# Patient Record
Sex: Female | Born: 1945 | Race: Black or African American | Hispanic: No | Marital: Married | Smoking: Current every day smoker
Health system: Southern US, Community
[De-identification: ages and names within clinical notes are randomized; demographics above are authoritative.]

## PROBLEM LIST (undated history)

## (undated) DIAGNOSIS — E119 Type 2 diabetes mellitus without complications: Secondary | ICD-10-CM

## (undated) DIAGNOSIS — I129 Hypertensive chronic kidney disease with stage 1 through stage 4 chronic kidney disease, or unspecified chronic kidney disease: Secondary | ICD-10-CM

## (undated) DIAGNOSIS — N183 Chronic kidney disease, stage 3 (moderate): Secondary | ICD-10-CM

## (undated) DIAGNOSIS — E785 Hyperlipidemia, unspecified: Secondary | ICD-10-CM

## (undated) DIAGNOSIS — E1122 Type 2 diabetes mellitus with diabetic chronic kidney disease: Secondary | ICD-10-CM

## (undated) DIAGNOSIS — R202 Paresthesia of skin: Secondary | ICD-10-CM

## (undated) DIAGNOSIS — N189 Chronic kidney disease, unspecified: Secondary | ICD-10-CM

## (undated) DIAGNOSIS — N8500 Endometrial hyperplasia, unspecified: Secondary | ICD-10-CM

## (undated) DIAGNOSIS — C50919 Malignant neoplasm of unspecified site of unspecified female breast: Secondary | ICD-10-CM

## (undated) DIAGNOSIS — N08 Glomerular disorders in diseases classified elsewhere: Secondary | ICD-10-CM

## (undated) DIAGNOSIS — I10 Essential (primary) hypertension: Secondary | ICD-10-CM

## (undated) DIAGNOSIS — R200 Anesthesia of skin: Secondary | ICD-10-CM

## (undated) HISTORY — PX: BACK SURGERY: SHX140

## (undated) HISTORY — DX: Type 2 diabetes mellitus with diabetic chronic kidney disease: E11.22

## (undated) HISTORY — DX: Chronic kidney disease, stage 3 (moderate): N18.3

## (undated) HISTORY — PX: COLONOSCOPY: SHX174

## (undated) HISTORY — PX: BREAST LUMPECTOMY: SHX2

## (undated) HISTORY — DX: Endometrial hyperplasia, unspecified: N85.00

## (undated) HISTORY — DX: Hypertensive chronic kidney disease with stage 1 through stage 4 chronic kidney disease, or unspecified chronic kidney disease: I12.9

## (undated) HISTORY — PX: KNEE SURGERY: SHX244

## (undated) HISTORY — DX: Glomerular disorders in diseases classified elsewhere: N08

---

## 1998-01-22 ENCOUNTER — Encounter: Admission: RE | Admit: 1998-01-22 | Discharge: 1998-04-22 | Payer: Self-pay | Admitting: Radiation Oncology

## 1998-01-28 ENCOUNTER — Encounter: Admission: RE | Admit: 1998-01-28 | Discharge: 1998-04-28 | Payer: Self-pay | Admitting: Radiation Oncology

## 1998-11-06 ENCOUNTER — Encounter: Payer: Self-pay | Admitting: Neurosurgery

## 1998-11-06 ENCOUNTER — Inpatient Hospital Stay (HOSPITAL_COMMUNITY): Admission: RE | Admit: 1998-11-06 | Discharge: 1998-11-07 | Payer: Self-pay | Admitting: Neurosurgery

## 1999-01-01 ENCOUNTER — Other Ambulatory Visit: Admission: RE | Admit: 1999-01-01 | Discharge: 1999-01-01 | Payer: Self-pay | Admitting: Obstetrics & Gynecology

## 1999-08-06 ENCOUNTER — Other Ambulatory Visit: Admission: RE | Admit: 1999-08-06 | Discharge: 1999-08-06 | Payer: Self-pay | Admitting: Gastroenterology

## 1999-08-06 ENCOUNTER — Encounter (INDEPENDENT_AMBULATORY_CARE_PROVIDER_SITE_OTHER): Payer: Self-pay | Admitting: Specialist

## 2000-01-10 ENCOUNTER — Other Ambulatory Visit: Admission: RE | Admit: 2000-01-10 | Discharge: 2000-01-10 | Payer: Self-pay | Admitting: Obstetrics & Gynecology

## 2000-02-01 ENCOUNTER — Encounter: Admission: RE | Admit: 2000-02-01 | Discharge: 2000-02-01 | Payer: Self-pay | Admitting: Hematology and Oncology

## 2000-02-01 ENCOUNTER — Encounter: Payer: Self-pay | Admitting: Hematology and Oncology

## 2000-04-19 ENCOUNTER — Encounter: Admission: RE | Admit: 2000-04-19 | Discharge: 2000-04-19 | Payer: Self-pay | Admitting: Hematology and Oncology

## 2000-04-19 ENCOUNTER — Encounter: Payer: Self-pay | Admitting: Hematology and Oncology

## 2000-05-31 ENCOUNTER — Other Ambulatory Visit: Admission: RE | Admit: 2000-05-31 | Discharge: 2000-05-31 | Payer: Self-pay | Admitting: Obstetrics & Gynecology

## 2000-07-24 ENCOUNTER — Encounter: Admission: RE | Admit: 2000-07-24 | Discharge: 2000-07-24 | Payer: Self-pay | Admitting: Hematology and Oncology

## 2000-07-24 ENCOUNTER — Encounter: Payer: Self-pay | Admitting: Hematology and Oncology

## 2000-12-12 ENCOUNTER — Other Ambulatory Visit: Admission: RE | Admit: 2000-12-12 | Discharge: 2000-12-12 | Payer: Self-pay | Admitting: Obstetrics & Gynecology

## 2001-05-21 ENCOUNTER — Encounter: Payer: Self-pay | Admitting: Hematology and Oncology

## 2001-05-21 ENCOUNTER — Ambulatory Visit (HOSPITAL_COMMUNITY): Admission: RE | Admit: 2001-05-21 | Discharge: 2001-05-21 | Payer: Self-pay | Admitting: Hematology and Oncology

## 2001-07-30 ENCOUNTER — Encounter: Admission: RE | Admit: 2001-07-30 | Discharge: 2001-07-30 | Payer: Self-pay | Admitting: Hematology and Oncology

## 2001-07-30 ENCOUNTER — Encounter: Payer: Self-pay | Admitting: Hematology and Oncology

## 2001-12-19 ENCOUNTER — Other Ambulatory Visit: Admission: RE | Admit: 2001-12-19 | Discharge: 2001-12-19 | Payer: Self-pay | Admitting: Obstetrics & Gynecology

## 2002-06-10 ENCOUNTER — Other Ambulatory Visit: Admission: RE | Admit: 2002-06-10 | Discharge: 2002-06-10 | Payer: Self-pay | Admitting: Obstetrics & Gynecology

## 2002-07-01 ENCOUNTER — Ambulatory Visit (HOSPITAL_COMMUNITY): Admission: RE | Admit: 2002-07-01 | Discharge: 2002-07-01 | Payer: Self-pay | Admitting: Internal Medicine

## 2002-07-01 ENCOUNTER — Encounter: Payer: Self-pay | Admitting: Internal Medicine

## 2002-08-01 ENCOUNTER — Encounter: Payer: Self-pay | Admitting: Hematology and Oncology

## 2002-08-01 ENCOUNTER — Encounter: Admission: RE | Admit: 2002-08-01 | Discharge: 2002-08-01 | Payer: Self-pay | Admitting: Hematology and Oncology

## 2002-12-20 ENCOUNTER — Other Ambulatory Visit: Admission: RE | Admit: 2002-12-20 | Discharge: 2002-12-20 | Payer: Self-pay | Admitting: Obstetrics & Gynecology

## 2003-08-05 ENCOUNTER — Encounter: Admission: RE | Admit: 2003-08-05 | Discharge: 2003-08-05 | Payer: Self-pay | Admitting: Internal Medicine

## 2003-08-05 ENCOUNTER — Encounter: Payer: Self-pay | Admitting: Internal Medicine

## 2003-12-24 ENCOUNTER — Other Ambulatory Visit: Admission: RE | Admit: 2003-12-24 | Discharge: 2003-12-24 | Payer: Self-pay | Admitting: Obstetrics & Gynecology

## 2004-08-06 ENCOUNTER — Encounter: Admission: RE | Admit: 2004-08-06 | Discharge: 2004-08-06 | Payer: Self-pay | Admitting: Internal Medicine

## 2004-12-27 ENCOUNTER — Other Ambulatory Visit: Admission: RE | Admit: 2004-12-27 | Discharge: 2004-12-27 | Payer: Self-pay | Admitting: Obstetrics & Gynecology

## 2005-08-01 ENCOUNTER — Encounter: Admission: RE | Admit: 2005-08-01 | Discharge: 2005-08-01 | Payer: Self-pay | Admitting: Internal Medicine

## 2005-08-09 ENCOUNTER — Encounter: Admission: RE | Admit: 2005-08-09 | Discharge: 2005-08-09 | Payer: Self-pay | Admitting: Internal Medicine

## 2005-12-28 ENCOUNTER — Other Ambulatory Visit: Admission: RE | Admit: 2005-12-28 | Discharge: 2005-12-28 | Payer: Self-pay | Admitting: Obstetrics & Gynecology

## 2006-08-29 ENCOUNTER — Encounter: Admission: RE | Admit: 2006-08-29 | Discharge: 2006-08-29 | Payer: Self-pay | Admitting: Internal Medicine

## 2007-09-03 ENCOUNTER — Encounter: Admission: RE | Admit: 2007-09-03 | Discharge: 2007-09-03 | Payer: Self-pay | Admitting: Internal Medicine

## 2008-02-14 ENCOUNTER — Encounter: Admission: RE | Admit: 2008-02-14 | Discharge: 2008-02-14 | Payer: Self-pay | Admitting: Internal Medicine

## 2008-02-25 ENCOUNTER — Ambulatory Visit (HOSPITAL_BASED_OUTPATIENT_CLINIC_OR_DEPARTMENT_OTHER): Admission: RE | Admit: 2008-02-25 | Discharge: 2008-02-25 | Payer: Self-pay | Admitting: Orthopedic Surgery

## 2008-09-03 ENCOUNTER — Encounter: Admission: RE | Admit: 2008-09-03 | Discharge: 2008-09-03 | Payer: Self-pay | Admitting: Internal Medicine

## 2008-09-25 ENCOUNTER — Encounter: Admission: RE | Admit: 2008-09-25 | Discharge: 2008-09-25 | Payer: Self-pay | Admitting: Internal Medicine

## 2009-09-11 ENCOUNTER — Encounter: Admission: RE | Admit: 2009-09-11 | Discharge: 2009-09-11 | Payer: Self-pay | Admitting: Internal Medicine

## 2009-09-22 ENCOUNTER — Encounter: Admission: RE | Admit: 2009-09-22 | Discharge: 2009-09-22 | Payer: Self-pay | Admitting: Internal Medicine

## 2010-11-14 ENCOUNTER — Encounter: Payer: Self-pay | Admitting: Internal Medicine

## 2011-03-08 NOTE — Op Note (Signed)
[name], Tammy Preston               ACCOUNT NO.:  [id number]   MEDICAL RECORD NO.:  [id number]          PATIENT TYPE:  AMB   LOCATION:  [facility]                         FACILITY:  [facility]   PHYSICIAN:  Marlowe Kays, M.D.  DATE OF BIRTH:  10/15/1946   DATE OF PROCEDURE:  02/25/2008  DATE OF DISCHARGE:                               OPERATIVE REPORT   PREOPERATIVE DIAGNOSES:  1. Torn medial meniscus.  2. Osteoarthritis, right knee.   POSTOPERATIVE DIAGNOSES:  1. Torn medial meniscus.  2. Osteoarthritis, right knee.   OPERATION:  Right knee arthroscopy with partial medial meniscectomy.   SURGEON:  Marlowe Kays, M.D.   ASSISTANT:  Nurse.   ANESTHESIA:  General.   INDICATIONS FOR PROCEDURE:  Because of pain and swelling in her right  knee she had a an MRI performed which showed torn posterior horn tear of  the medial meniscus with some arthritic changes.  She is here today for  treatment consequently, because of significant pain.   PROCEDURE:  Satisfactory general anesthesia, Ace wrap and knee support  to left lower extremity, pneumatic tourniquet right lower extremity  which was Esmarch out non-sterilely.  Thigh stabilizer then applied and  right leg prepped with DuraPrep from stabilizer to ankle and draped in  sterile field.  Time-out performed.  Superior medial saline inflow.  First through an anteromedial portal the lateral compartment knee joint  was evaluated.  She had some minimal fraying of the inner border of the  lateral meniscus which I did not feel warranted shaving, as well as some  roughening of the lateral tibial plateau.  The lateral femoral condyle  in contrast looked quite good.  Looking at the lateral gutter and  suprapatellar area she had some minimal wear of her patella, but again  nothing that required shaving.  I the reversed portals.  She had mild  synovitis medially which I resected for better visualization.  She had  an extensive tear involving the  entire posterior third of the medial  meniscus and in the direct posterior area a flap which was flipped back  on the apparent meniscus.  I resected the meniscus back to stable rim  with a combination of baskets shaving it down until smooth with a 3.5  shaver.  The final remnant was stable on probing.  I then irrigated her  knee joint until clear and all fluid possible was removed.  The two  anterior portals were closed with 4-0 nylon.  I injected 20 mL of 0.5%  Marcaine with adrenaline through the inflow apparatus which was removed  and this portal  closed with 4-0 nylon as well.  Betadine and Adaptic dressing were  applied.  Tourniquet was released.  She tolerated the procedure well.  At the time of the time of this dictation she was on her way to the  recovery room in satisfactory condition with no known complications.           ______________________________  Marlowe Kays, M.D.     JA/MEDQ  D:  02/25/2008  T:  02/25/2008  Job:  [id number]

## 2012-06-06 DIAGNOSIS — H35039 Hypertensive retinopathy, unspecified eye: Secondary | ICD-10-CM | POA: Diagnosis not present

## 2012-06-06 DIAGNOSIS — E119 Type 2 diabetes mellitus without complications: Secondary | ICD-10-CM | POA: Diagnosis not present

## 2012-06-06 DIAGNOSIS — H43819 Vitreous degeneration, unspecified eye: Secondary | ICD-10-CM | POA: Diagnosis not present

## 2012-06-06 DIAGNOSIS — B5801 Toxoplasma chorioretinitis: Secondary | ICD-10-CM | POA: Diagnosis not present

## 2012-07-02 DIAGNOSIS — Z79899 Other long term (current) drug therapy: Secondary | ICD-10-CM | POA: Diagnosis not present

## 2012-07-02 DIAGNOSIS — Z0000 Encounter for general adult medical examination without abnormal findings: Secondary | ICD-10-CM | POA: Diagnosis not present

## 2012-07-02 DIAGNOSIS — E785 Hyperlipidemia, unspecified: Secondary | ICD-10-CM | POA: Diagnosis not present

## 2012-07-02 DIAGNOSIS — E119 Type 2 diabetes mellitus without complications: Secondary | ICD-10-CM | POA: Diagnosis not present

## 2012-07-02 DIAGNOSIS — I10 Essential (primary) hypertension: Secondary | ICD-10-CM | POA: Diagnosis not present

## 2012-12-31 DIAGNOSIS — Z803 Family history of malignant neoplasm of breast: Secondary | ICD-10-CM | POA: Diagnosis not present

## 2012-12-31 DIAGNOSIS — Z1231 Encounter for screening mammogram for malignant neoplasm of breast: Secondary | ICD-10-CM | POA: Diagnosis not present

## 2013-01-03 DIAGNOSIS — N6489 Other specified disorders of breast: Secondary | ICD-10-CM | POA: Diagnosis not present

## 2013-01-07 DIAGNOSIS — Z0000 Encounter for general adult medical examination without abnormal findings: Secondary | ICD-10-CM | POA: Diagnosis not present

## 2013-01-07 DIAGNOSIS — E119 Type 2 diabetes mellitus without complications: Secondary | ICD-10-CM | POA: Diagnosis not present

## 2013-01-07 DIAGNOSIS — E559 Vitamin D deficiency, unspecified: Secondary | ICD-10-CM | POA: Diagnosis not present

## 2013-01-07 DIAGNOSIS — I10 Essential (primary) hypertension: Secondary | ICD-10-CM | POA: Diagnosis not present

## 2013-01-07 DIAGNOSIS — E785 Hyperlipidemia, unspecified: Secondary | ICD-10-CM | POA: Diagnosis not present

## 2013-01-07 DIAGNOSIS — Z79899 Other long term (current) drug therapy: Secondary | ICD-10-CM | POA: Diagnosis not present

## 2013-01-18 ENCOUNTER — Other Ambulatory Visit: Payer: Self-pay | Admitting: Internal Medicine

## 2013-01-18 DIAGNOSIS — R1906 Epigastric swelling, mass or lump: Secondary | ICD-10-CM

## 2013-01-22 ENCOUNTER — Ambulatory Visit
Admission: RE | Admit: 2013-01-22 | Discharge: 2013-01-22 | Disposition: A | Discharge status: Home / Self Care | Payer: Medicare Other | Source: Ambulatory Visit | Attending: Internal Medicine | Admitting: Internal Medicine

## 2013-01-22 DIAGNOSIS — R1906 Epigastric swelling, mass or lump: Secondary | ICD-10-CM | POA: Diagnosis not present

## 2013-01-22 DIAGNOSIS — K5730 Diverticulosis of large intestine without perforation or abscess without bleeding: Secondary | ICD-10-CM | POA: Diagnosis not present

## 2013-01-22 DIAGNOSIS — Z1211 Encounter for screening for malignant neoplasm of colon: Secondary | ICD-10-CM | POA: Diagnosis not present

## 2013-01-22 DIAGNOSIS — Z86010 Personal history of colonic polyps: Secondary | ICD-10-CM | POA: Diagnosis not present

## 2013-01-22 DIAGNOSIS — E669 Obesity, unspecified: Secondary | ICD-10-CM | POA: Diagnosis not present

## 2013-01-22 MED ORDER — IOHEXOL 300 MG/ML  SOLN
125.0000 mL | Freq: Once | INTRAMUSCULAR | Status: AC | PRN
  Administered 2013-01-22: 125 mL via INTRAVENOUS

## 2013-03-25 DIAGNOSIS — D126 Benign neoplasm of colon, unspecified: Secondary | ICD-10-CM | POA: Diagnosis not present

## 2013-03-25 DIAGNOSIS — Z86010 Personal history of colonic polyps: Secondary | ICD-10-CM | POA: Diagnosis not present

## 2013-03-25 DIAGNOSIS — K5730 Diverticulosis of large intestine without perforation or abscess without bleeding: Secondary | ICD-10-CM | POA: Diagnosis not present

## 2013-03-25 DIAGNOSIS — Z1211 Encounter for screening for malignant neoplasm of colon: Secondary | ICD-10-CM | POA: Diagnosis not present

## 2013-06-06 DIAGNOSIS — B5801 Toxoplasma chorioretinitis: Secondary | ICD-10-CM | POA: Diagnosis not present

## 2013-06-06 DIAGNOSIS — H35039 Hypertensive retinopathy, unspecified eye: Secondary | ICD-10-CM | POA: Diagnosis not present

## 2013-06-06 DIAGNOSIS — E119 Type 2 diabetes mellitus without complications: Secondary | ICD-10-CM | POA: Diagnosis not present

## 2013-06-06 DIAGNOSIS — H43819 Vitreous degeneration, unspecified eye: Secondary | ICD-10-CM | POA: Diagnosis not present

## 2013-06-14 DIAGNOSIS — E785 Hyperlipidemia, unspecified: Secondary | ICD-10-CM | POA: Diagnosis not present

## 2013-06-14 DIAGNOSIS — N959 Unspecified menopausal and perimenopausal disorder: Secondary | ICD-10-CM | POA: Diagnosis not present

## 2013-06-14 DIAGNOSIS — E119 Type 2 diabetes mellitus without complications: Secondary | ICD-10-CM | POA: Diagnosis not present

## 2013-06-14 DIAGNOSIS — I10 Essential (primary) hypertension: Secondary | ICD-10-CM | POA: Diagnosis not present

## 2013-06-14 DIAGNOSIS — N951 Menopausal and female climacteric states: Secondary | ICD-10-CM | POA: Diagnosis not present

## 2013-10-03 IMAGING — CT CT ABDOMEN W/ CM
2 of 5 series · 16 of 46 positions shown, 18 images · IV contrast (READICAT/WATER & [ID] OMNI 300)
Comparison: None.

CLINICAL DATA: Epigastric swelling, palpable mass, remote history
breast cancer

CT ABDOMEN WITH CONTRAST
TECHNIQUE: Multidetector CT imaging of the abdomen was performed
following the standard protocol during bolus administration of
intravenous contrast.
Contrast: 125mL OMNIPAQUE IOHEXOL 300 MG/ML  SOLN

[Series 2: abdomen w/ · axial · 0.75mm/px · z∈[-197,+23]mm · 13 of 52 slices shown, 15 images]
[im 4/52  soft-tissue]
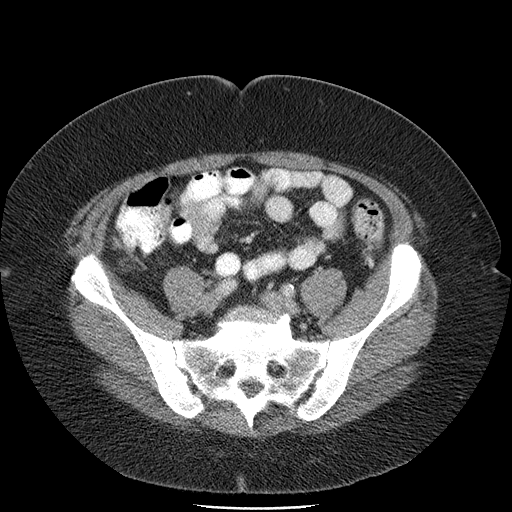
[im 4/52  bone]
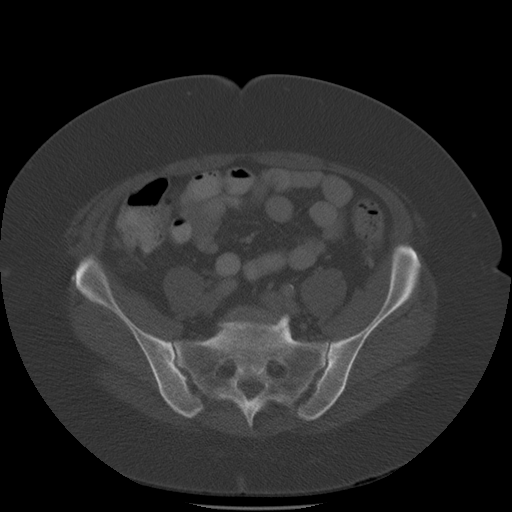
[im 8/52  soft-tissue]
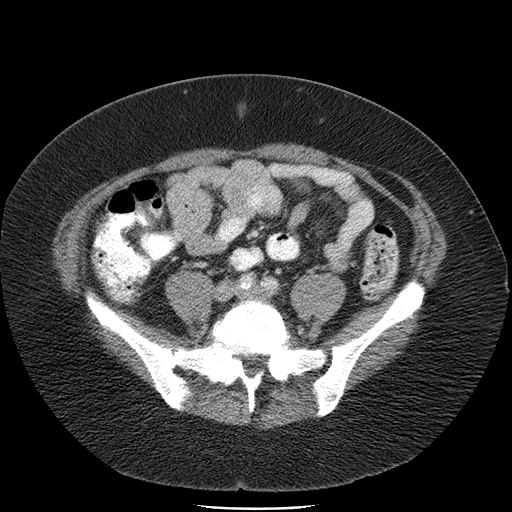
[im 11/52  soft-tissue]
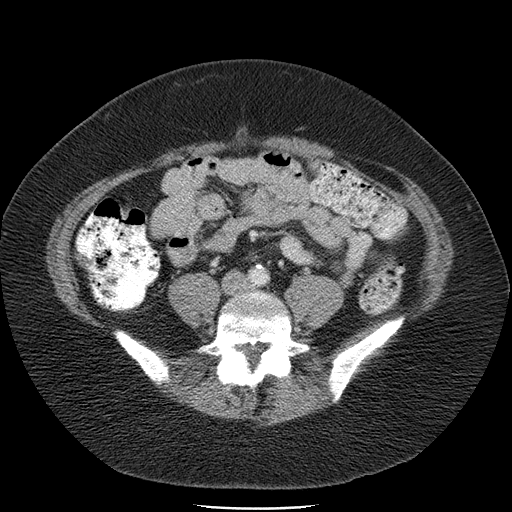
[im 15/52  soft-tissue]
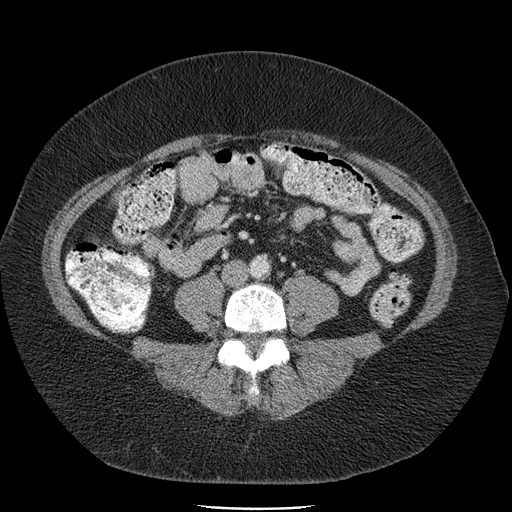
[im 19/52  soft-tissue]
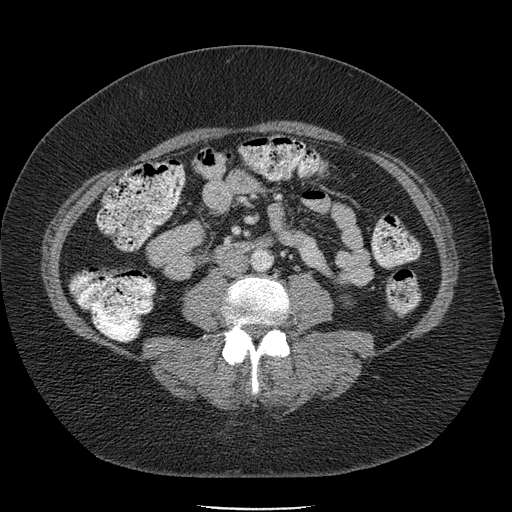
[im 22/52  soft-tissue]
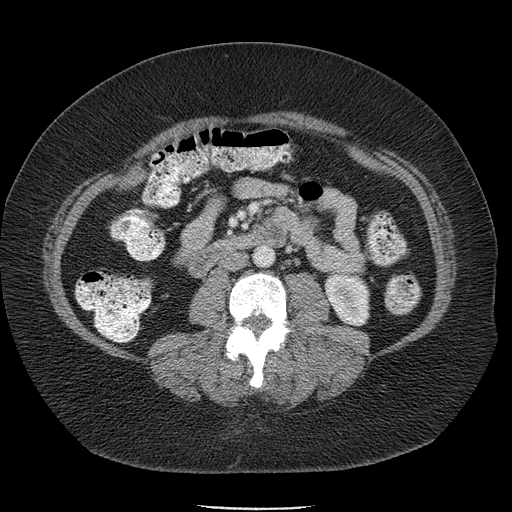
[im 26/52  soft-tissue]
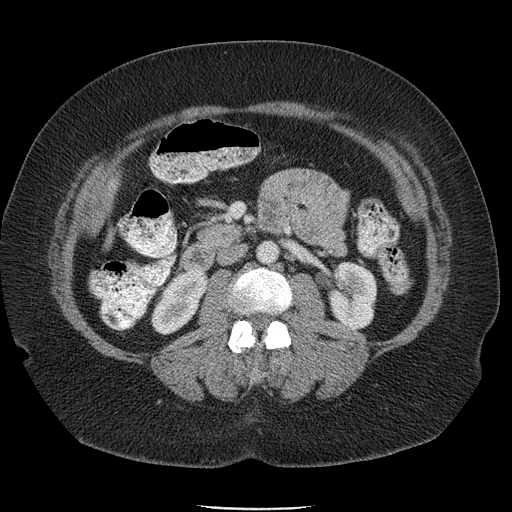
[im 30/52  soft-tissue]
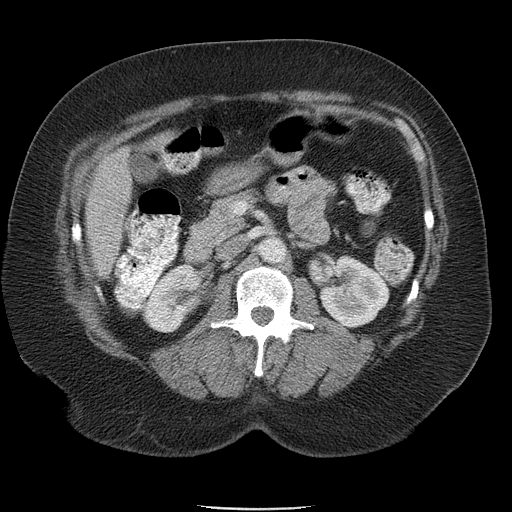
[im 33/52  soft-tissue]
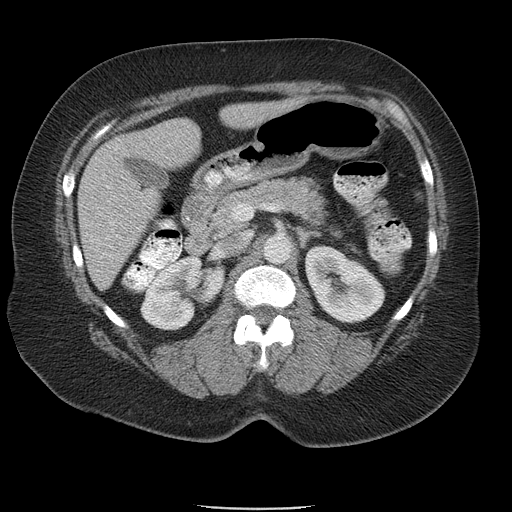
[im 33/52  bone]
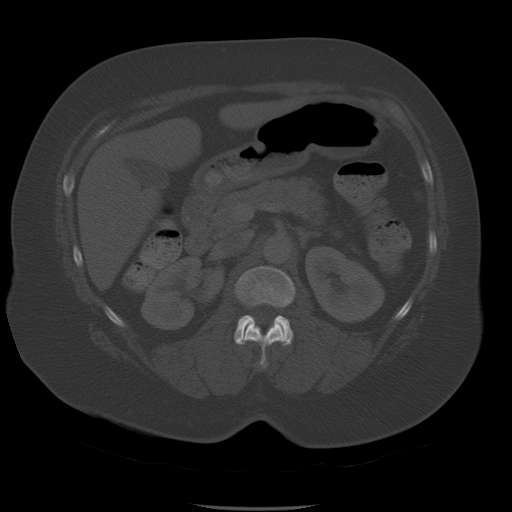
[im 37/52  soft-tissue]
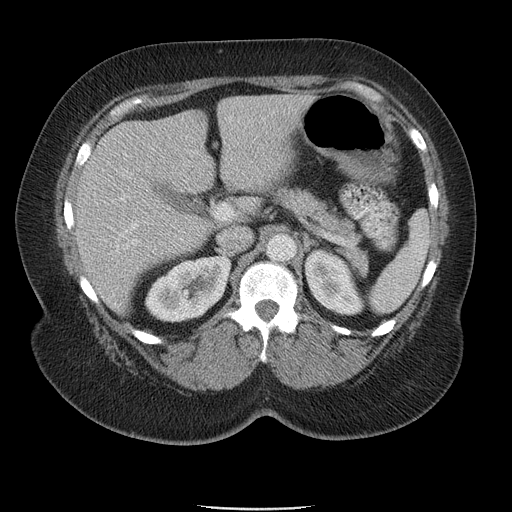
[im 41/52  soft-tissue]
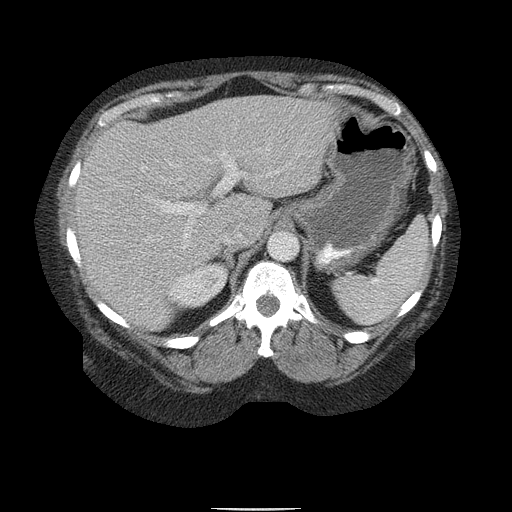
[im 44/52  soft-tissue]
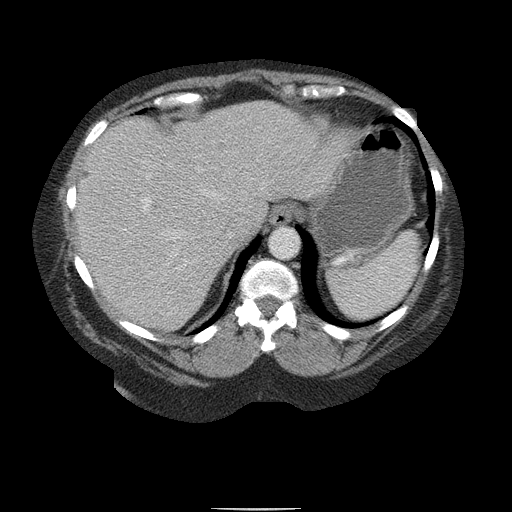
[im 48/52  soft-tissue]
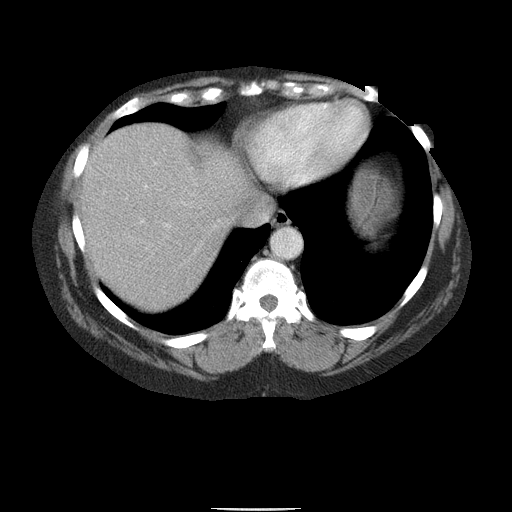

[Series 400: cor · coronal · 0.75mm/px · 3 of 143 slices shown]
[im 48/143  soft-tissue]
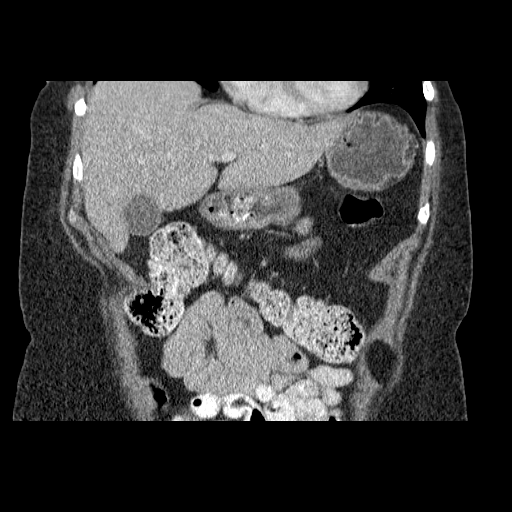
[im 64/143  soft-tissue]
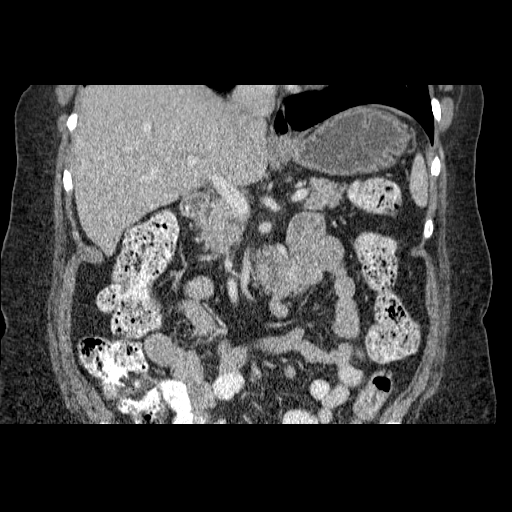
[im 79/143  soft-tissue]
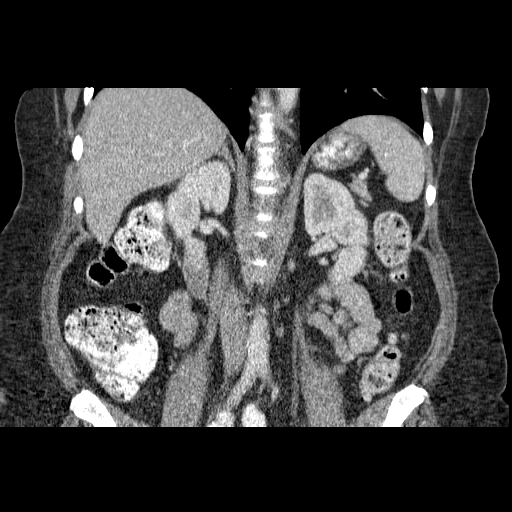

[16 of 46 positions shown; findings below may reference images not displayed]

FINDINGS: Lung bases clear.  Normal heart size.  No pericardial or
pleural effusion.  No hiatal hernia.

Abdomen:  5 mm focal hypodensity in the left hepatic dome, too
small to definitively characterize, image 8.  Suspect small hepatic
cyst.  Scattered punctate hepatic granulomata noted.  No other
focal hepatic abnormality or biliary dilatation.  Hepatic and
portal veins are patent.  Collapsed gallbladder, biliary system,
pancreas, spleen, accessory splenule, adrenal glands, and kidneys
demonstrate no acute process and are within normal limits for age.

No abdominal wall abnormalities noted anteriorly in the epigastric
region.  Negative for abdominal wall hernia.

Atherosclerotic changes noted of the aorta without aneurysm.

Negative for bowel obstruction, dilatation, ileus pattern, or free
air.

No abdominal free fluid, fluid collection, hemorrhage, abscess, or
adenopathy.  Scattered colonic diverticulosis.  Terminal ileum and
appendix visualized are unremarkable.

Degenerative changes noted diffusely of the spine.
IMPRESSION: No acute intra-abdominal process or finding.  Probable small
incidental left hepatic dome sub centimeter cyst.

No epigastric or abdominal wall abnormality.  Negative for hernia.

## 2014-01-01 DIAGNOSIS — Z1231 Encounter for screening mammogram for malignant neoplasm of breast: Secondary | ICD-10-CM | POA: Diagnosis not present

## 2014-01-17 DIAGNOSIS — Z124 Encounter for screening for malignant neoplasm of cervix: Secondary | ICD-10-CM | POA: Diagnosis not present

## 2014-01-17 DIAGNOSIS — E559 Vitamin D deficiency, unspecified: Secondary | ICD-10-CM | POA: Diagnosis not present

## 2014-01-17 DIAGNOSIS — Z0000 Encounter for general adult medical examination without abnormal findings: Secondary | ICD-10-CM | POA: Diagnosis not present

## 2014-01-17 DIAGNOSIS — N183 Chronic kidney disease, stage 3 unspecified: Secondary | ICD-10-CM | POA: Diagnosis not present

## 2014-01-17 DIAGNOSIS — Z01419 Encounter for gynecological examination (general) (routine) without abnormal findings: Secondary | ICD-10-CM | POA: Diagnosis not present

## 2014-01-17 DIAGNOSIS — E1129 Type 2 diabetes mellitus with other diabetic kidney complication: Secondary | ICD-10-CM | POA: Diagnosis not present

## 2014-01-17 DIAGNOSIS — I129 Hypertensive chronic kidney disease with stage 1 through stage 4 chronic kidney disease, or unspecified chronic kidney disease: Secondary | ICD-10-CM | POA: Diagnosis not present

## 2014-01-17 DIAGNOSIS — N058 Unspecified nephritic syndrome with other morphologic changes: Secondary | ICD-10-CM | POA: Diagnosis not present

## 2014-01-17 DIAGNOSIS — Z79899 Other long term (current) drug therapy: Secondary | ICD-10-CM | POA: Diagnosis not present

## 2014-01-17 DIAGNOSIS — E785 Hyperlipidemia, unspecified: Secondary | ICD-10-CM | POA: Diagnosis not present

## 2014-02-03 ENCOUNTER — Encounter (HOSPITAL_COMMUNITY): Payer: Self-pay | Admitting: Emergency Medicine

## 2014-02-03 ENCOUNTER — Emergency Department (HOSPITAL_COMMUNITY)
Admission: EM | Admit: 2014-02-03 | Discharge: 2014-02-03 | Disposition: A | Discharge status: Home / Self Care | Payer: Medicare Other | Attending: Emergency Medicine | Admitting: Emergency Medicine

## 2014-02-03 DIAGNOSIS — E785 Hyperlipidemia, unspecified: Secondary | ICD-10-CM | POA: Diagnosis not present

## 2014-02-03 DIAGNOSIS — Z7982 Long term (current) use of aspirin: Secondary | ICD-10-CM | POA: Insufficient documentation

## 2014-02-03 DIAGNOSIS — I10 Essential (primary) hypertension: Secondary | ICD-10-CM | POA: Insufficient documentation

## 2014-02-03 DIAGNOSIS — Z79899 Other long term (current) drug therapy: Secondary | ICD-10-CM | POA: Insufficient documentation

## 2014-02-03 DIAGNOSIS — F17200 Nicotine dependence, unspecified, uncomplicated: Secondary | ICD-10-CM | POA: Diagnosis not present

## 2014-02-03 DIAGNOSIS — E119 Type 2 diabetes mellitus without complications: Secondary | ICD-10-CM | POA: Diagnosis not present

## 2014-02-03 DIAGNOSIS — Z853 Personal history of malignant neoplasm of breast: Secondary | ICD-10-CM | POA: Insufficient documentation

## 2014-02-03 DIAGNOSIS — M79609 Pain in unspecified limb: Secondary | ICD-10-CM | POA: Diagnosis not present

## 2014-02-03 DIAGNOSIS — M79606 Pain in leg, unspecified: Secondary | ICD-10-CM

## 2014-02-03 HISTORY — DX: Type 2 diabetes mellitus without complications: E11.9

## 2014-02-03 HISTORY — DX: Malignant neoplasm of unspecified site of unspecified female breast: C50.919

## 2014-02-03 HISTORY — DX: Hyperlipidemia, unspecified: E78.5

## 2014-02-03 HISTORY — DX: Essential (primary) hypertension: I10

## 2014-02-03 LAB — D-DIMER, QUANTITATIVE: D-Dimer, Quant: 0.4 ug/mL-FEU (ref 0.00–0.48)

## 2014-02-03 MED ORDER — TRAMADOL HCL 50 MG PO TABS: 50.0000 mg | ORAL_TABLET | Freq: Four times a day (QID) | ORAL | Status: DC | PRN

## 2014-02-03 MED ORDER — TRAMADOL HCL 50 MG PO TABS
50.0000 mg | ORAL_TABLET | Freq: Once | ORAL | Status: AC
  Administered 2014-02-03: 50 mg via ORAL
  Filled 2014-02-03: qty 1

## 2014-02-03 NOTE — Discharge Instructions (Signed)
Recommend you drink plenty of fluids. Followup with your primary care provider for further evaluation of symptoms. You may take Tramadol as needed for severe pain.  Myalgia, Adult Myalgia is the medical term for muscle pain. It is a symptom of many things. Nearly everyone at some time in their life has this. The most common cause for muscle pain is overuse or straining and more so when you are not in shape. Injuries and muscle bruises cause myalgias. Muscle pain without a history of injury can also be caused by a virus. It frequently comes along with the flu. Myalgia not caused by muscle strain can be present in a large number of infectious diseases. Some autoimmune diseases like lupus and fibromyalgia can cause muscle pain. Myalgia may be mild, or severe. SYMPTOMS  The symptoms of myalgia are simply muscle pain. Most of the time this is short lived and the pain goes away without treatment. DIAGNOSIS  Myalgia is diagnosed by your caregiver by taking your history. This means you tell him when the problems began, what they are, and what has been happening. If this has not been a long term problem, your caregiver may want to watch for a while to see what will happen. If it has been long term, they may want to do additional testing. TREATMENT  The treatment depends on what the underlying cause of the muscle pain is. Often anti-inflammatory medications will help. HOME CARE INSTRUCTIONS  If the pain in your muscles came from overuse, slow down your activities until the problems go away.  Myalgia from overuse of a muscle can be treated with alternating hot and cold packs on the muscle affected or with cold for the first couple days. If either heat or cold seems to make things worse, stop their use.  Apply ice to the sore area for 15-20 minutes, 03-04 times per day, while awake for the first 2 days of muscle soreness, or as directed. Put the ice in a plastic bag and place a towel between the bag of ice and  your skin.  Only take over-the-counter or prescription medicines for pain, discomfort, or fever as directed by your caregiver.  Regular gentle exercise may help if you are not active.  Stretching before strenuous exercise can help lower the risk of myalgia. It is normal when beginning an exercise regimen to feel some muscle pain after exercising. Muscles that have not been used frequently will be sore at first. If the pain is extreme, this may mean injury to a muscle. SEEK MEDICAL CARE IF:  You have an increase in muscle pain that is not relieved with medication.  You begin to run a temperature.  You develop nausea and vomiting.  You develop a stiff and painful neck.  You develop a rash.  You develop muscle pain after a tick bite.  You have continued muscle pain while working out even after you are in good condition. SEEK IMMEDIATE MEDICAL CARE IF: Any of your problems are getting worse and medications are not helping. MAKE SURE YOU:   Understand these instructions.  Will watch your condition.  Will get help right away if you are not doing well or get worse. Document Released: 09/01/2006 Document Revised: 01/02/2012 Document Reviewed: 11/21/2006 [facility] Patient Information 2014 [place], [place].

## 2014-02-03 NOTE — ED Provider Notes (Signed)
CSN: [id number]     Arrival date & time 02/03/14  0037 History   First MD Initiated Contact with Patient 02/03/14 0125     Chief Complaint  Patient presents with  . Leg Pain    (Consider location/radiation/quality/duration/timing/severity/associated sxs/prior Treatment) HPI Comments: Patient is a 68 year old female with a history of hypertension, diabetes mellitus, and breast cancer who presents to the emergency department for right lower lateral leg pain. Patient states the pain has been present for the past 2 weeks and constant. She states the pain is throbbing and not relieved by Tylenol. She states that when she elevates her right leg she feels a similar pain in her right hip. She denies any trauma or injury to the area as well as back pain, bowel/bladder incontinence, and genital/perianal numbness. Earlier today, patient states she felt a knot on the side of her leg where her pain was. She is concerned that she may have a blood clot. Patient denies a history of blood clots, coagulopathies, recent surgeries or hospitalizations, fever, syncope, redness or swelling in her right lower extremity, numbness/tingling, and extremity weakness.  Patient is a 68 y.o. female presenting with leg pain. The history is provided by the patient. No language interpreter was used.  Leg Pain   Past Medical History  Diagnosis Date  . Hypertension   . Diabetes mellitus without complication   . Hyperlipemia   . Breast cancer    Past Surgical History  Procedure Laterality Date  . Back surgery    . Knee surgery    . Breast lumpectomy Left    No family history on file. History  Substance Use Topics  . Smoking status: Current Every Day Smoker -- 0.50 packs/day  . Smokeless tobacco: Not on file  . Alcohol Use: No   OB History   Grav Para Term Preterm Abortions TAB SAB Ect Mult Living                  Review of Systems  Musculoskeletal: Positive for myalgias.  All other systems reviewed and are  negative.    Allergies  Review of patient's allergies indicates no known allergies.  Home Medications   Current Outpatient Rx  Name  Route  Sig  Dispense  Refill  . aspirin EC 81 MG tablet   Oral   Take 81 mg by mouth daily.         Marland Kitchen HYDROCHLOROTHIAZIDE PO   Oral   Take by mouth.         Marland Kitchen L-Methylfolate-B6-B12 (FOLTANX PO)   Oral   Take by mouth.         Marland Kitchen LOSARTAN POTASSIUM PO   Oral   Take by mouth.         . METFORMIN HCL PO   Oral   Take by mouth.         . Multiple Vitamins-Minerals (ONE-A-DAY WOMENS 50 PLUS PO)   Oral   Take 1 tablet by mouth daily.         Marland Kitchen SIMVASTATIN PO   Oral   Take by mouth.         . traMADol (ULTRAM) 50 MG tablet   Oral   Take 1 tablet (50 mg total) by mouth every 6 (six) hours as needed.   15 tablet   0    BP 180/95  Pulse 96  Temp(Src) 98.1 F (36.7 C) (Oral)  SpO2 99%  Physical Exam  Nursing note and vitals reviewed. Constitutional: She is  oriented to person, place, and time. She appears well-developed and well-nourished. No distress.  HENT:  Head: Normocephalic and atraumatic.  Eyes: Conjunctivae and EOM are normal. No scleral icterus.  Neck: Normal range of motion.  Cardiovascular: Normal rate, regular rhythm and intact distal pulses.   DP and PT pulses 2+ b/l.  Pulmonary/Chest: Effort normal. No respiratory distress.  Musculoskeletal: Normal range of motion. She exhibits tenderness.       Right lower leg: She exhibits tenderness. She exhibits no bony tenderness, no swelling, no deformity and no laceration.       Legs: Neurological: She is alert and oriented to person, place, and time.  No gross sensory deficits appreciated. Patient ambulates with normal gait. Patellar and achilles reflexes 2+ b/l.  Skin: Skin is warm and dry. No rash noted. She is not diaphoretic. No erythema. No pallor.  No swelling, erythema, or red linear streaking.  Psychiatric: She has a normal mood and affect. Her behavior  is normal.    ED Course  Procedures (including critical care time) Labs Review Labs Reviewed  D-DIMER, QUANTITATIVE   Imaging Review No results found.   EKG Interpretation None      MDM   Final diagnoses:  Leg pain, lateral    Uncomplicated right lower lateral leg pain x2 weeks. Patient well and nontoxic appearing, hemodynamically stable, and afebrile. Tenderness is palpable on examination. No pitting edema swelling, erythema, or red linear streaking appreciated to the right lower extremity. Patient is neurovascularly intact today. No physical exam finding to suggest a blood clot/DVT and D dimer today is negative. No hx of surgeries or hospitalizations. No red flags or signs concerning for cauda equina WRT right hip pain. Suspect source of symptoms to be MSK in nature. Patient stable and appropriate for discharge with instruction to followup with her primary care provider. Return precautions provided and patient agreeable to plan with no unaddressed concerns.    Antonietta Breach, PA-C 02/03/14 [phone]

## 2014-02-03 NOTE — ED Notes (Signed)
Pt states that her R leg has a knot on the side and when she lifts her leg it hurts all the way up to her hip. Wants to make sure it's not a blood clot. No hx of such. Alert, oriented, ambulatory.

## 2014-02-03 NOTE — ED Provider Notes (Signed)
Medical screening examination/treatment/procedure(s) were performed by non-physician practitioner and as supervising physician I was immediately available for consultation/collaboration.   EKG Interpretation None        Hoy Morn, MD 02/03/14 0400

## 2014-04-30 DIAGNOSIS — N183 Chronic kidney disease, stage 3 unspecified: Secondary | ICD-10-CM | POA: Diagnosis not present

## 2014-04-30 DIAGNOSIS — R209 Unspecified disturbances of skin sensation: Secondary | ICD-10-CM | POA: Diagnosis not present

## 2014-04-30 DIAGNOSIS — E1129 Type 2 diabetes mellitus with other diabetic kidney complication: Secondary | ICD-10-CM | POA: Diagnosis not present

## 2014-04-30 DIAGNOSIS — I129 Hypertensive chronic kidney disease with stage 1 through stage 4 chronic kidney disease, or unspecified chronic kidney disease: Secondary | ICD-10-CM | POA: Diagnosis not present

## 2014-05-01 DIAGNOSIS — R209 Unspecified disturbances of skin sensation: Secondary | ICD-10-CM | POA: Diagnosis not present

## 2014-05-15 DIAGNOSIS — X19XXXA Contact with other heat and hot substances, initial encounter: Secondary | ICD-10-CM | POA: Diagnosis not present

## 2014-05-15 DIAGNOSIS — T23039A Burn of unspecified degree of unspecified multiple fingers (nail), not including thumb, initial encounter: Secondary | ICD-10-CM | POA: Diagnosis not present

## 2014-06-06 DIAGNOSIS — H43399 Other vitreous opacities, unspecified eye: Secondary | ICD-10-CM | POA: Diagnosis not present

## 2014-06-06 DIAGNOSIS — H35039 Hypertensive retinopathy, unspecified eye: Secondary | ICD-10-CM | POA: Diagnosis not present

## 2014-06-06 DIAGNOSIS — B589 Toxoplasmosis, unspecified: Secondary | ICD-10-CM | POA: Diagnosis not present

## 2014-06-06 DIAGNOSIS — E119 Type 2 diabetes mellitus without complications: Secondary | ICD-10-CM | POA: Diagnosis not present

## 2014-07-21 DIAGNOSIS — E1129 Type 2 diabetes mellitus with other diabetic kidney complication: Secondary | ICD-10-CM | POA: Diagnosis not present

## 2014-07-21 DIAGNOSIS — N183 Chronic kidney disease, stage 3 unspecified: Secondary | ICD-10-CM | POA: Diagnosis not present

## 2014-07-21 DIAGNOSIS — F17200 Nicotine dependence, unspecified, uncomplicated: Secondary | ICD-10-CM | POA: Diagnosis not present

## 2014-07-21 DIAGNOSIS — N058 Unspecified nephritic syndrome with other morphologic changes: Secondary | ICD-10-CM | POA: Diagnosis not present

## 2014-07-21 DIAGNOSIS — I129 Hypertensive chronic kidney disease with stage 1 through stage 4 chronic kidney disease, or unspecified chronic kidney disease: Secondary | ICD-10-CM | POA: Diagnosis not present

## 2014-07-21 DIAGNOSIS — E559 Vitamin D deficiency, unspecified: Secondary | ICD-10-CM | POA: Diagnosis not present

## 2014-07-21 DIAGNOSIS — Z79899 Other long term (current) drug therapy: Secondary | ICD-10-CM | POA: Diagnosis not present

## 2014-10-01 DIAGNOSIS — M25562 Pain in left knee: Secondary | ICD-10-CM | POA: Diagnosis not present

## 2014-10-01 DIAGNOSIS — Z79899 Other long term (current) drug therapy: Secondary | ICD-10-CM | POA: Diagnosis not present

## 2015-01-06 DIAGNOSIS — Z853 Personal history of malignant neoplasm of breast: Secondary | ICD-10-CM | POA: Diagnosis not present

## 2015-01-06 DIAGNOSIS — Z1231 Encounter for screening mammogram for malignant neoplasm of breast: Secondary | ICD-10-CM | POA: Diagnosis not present

## 2015-01-20 DIAGNOSIS — Z79899 Other long term (current) drug therapy: Secondary | ICD-10-CM | POA: Diagnosis not present

## 2015-01-20 DIAGNOSIS — Z5181 Encounter for therapeutic drug level monitoring: Secondary | ICD-10-CM | POA: Diagnosis not present

## 2015-01-20 DIAGNOSIS — N183 Chronic kidney disease, stage 3 (moderate): Secondary | ICD-10-CM | POA: Diagnosis not present

## 2015-01-20 DIAGNOSIS — Z79891 Long term (current) use of opiate analgesic: Secondary | ICD-10-CM | POA: Diagnosis not present

## 2015-01-20 DIAGNOSIS — E1121 Type 2 diabetes mellitus with diabetic nephropathy: Secondary | ICD-10-CM | POA: Diagnosis not present

## 2015-01-20 DIAGNOSIS — N08 Glomerular disorders in diseases classified elsewhere: Secondary | ICD-10-CM | POA: Diagnosis not present

## 2015-01-20 DIAGNOSIS — F17210 Nicotine dependence, cigarettes, uncomplicated: Secondary | ICD-10-CM | POA: Diagnosis not present

## 2015-01-20 DIAGNOSIS — I129 Hypertensive chronic kidney disease with stage 1 through stage 4 chronic kidney disease, or unspecified chronic kidney disease: Secondary | ICD-10-CM | POA: Diagnosis not present

## 2015-01-20 DIAGNOSIS — E785 Hyperlipidemia, unspecified: Secondary | ICD-10-CM | POA: Diagnosis not present

## 2015-01-20 DIAGNOSIS — Z0000 Encounter for general adult medical examination without abnormal findings: Secondary | ICD-10-CM | POA: Diagnosis not present

## 2015-06-09 DIAGNOSIS — B589 Toxoplasmosis, unspecified: Secondary | ICD-10-CM | POA: Diagnosis not present

## 2015-06-09 DIAGNOSIS — E119 Type 2 diabetes mellitus without complications: Secondary | ICD-10-CM | POA: Diagnosis not present

## 2015-06-09 DIAGNOSIS — H43813 Vitreous degeneration, bilateral: Secondary | ICD-10-CM | POA: Diagnosis not present

## 2015-06-09 DIAGNOSIS — H35039 Hypertensive retinopathy, unspecified eye: Secondary | ICD-10-CM | POA: Diagnosis not present

## 2015-06-22 DIAGNOSIS — E668 Other obesity: Secondary | ICD-10-CM | POA: Diagnosis not present

## 2015-06-22 DIAGNOSIS — N183 Chronic kidney disease, stage 3 (moderate): Secondary | ICD-10-CM | POA: Diagnosis not present

## 2015-06-22 DIAGNOSIS — E1121 Type 2 diabetes mellitus with diabetic nephropathy: Secondary | ICD-10-CM | POA: Diagnosis not present

## 2015-06-22 DIAGNOSIS — I129 Hypertensive chronic kidney disease with stage 1 through stage 4 chronic kidney disease, or unspecified chronic kidney disease: Secondary | ICD-10-CM | POA: Diagnosis not present

## 2015-06-22 DIAGNOSIS — Z79899 Other long term (current) drug therapy: Secondary | ICD-10-CM | POA: Diagnosis not present

## 2015-06-22 DIAGNOSIS — N08 Glomerular disorders in diseases classified elsewhere: Secondary | ICD-10-CM | POA: Diagnosis not present

## 2015-11-11 ENCOUNTER — Ambulatory Visit (INDEPENDENT_AMBULATORY_CARE_PROVIDER_SITE_OTHER): Payer: Medicare Other

## 2015-11-11 ENCOUNTER — Ambulatory Visit (INDEPENDENT_AMBULATORY_CARE_PROVIDER_SITE_OTHER): Payer: Medicare Other | Admitting: Family Medicine

## 2015-11-11 VITALS — BP 128/70 | HR 82 | Temp 98.2°F | Resp 16 | Ht 66.25 in | Wt 218.4 lb

## 2015-11-11 DIAGNOSIS — M25562 Pain in left knee: Secondary | ICD-10-CM | POA: Diagnosis not present

## 2015-11-11 DIAGNOSIS — M1712 Unilateral primary osteoarthritis, left knee: Secondary | ICD-10-CM

## 2015-11-11 MED ORDER — DICLOFENAC SODIUM 1 % TD GEL: 2.0000 g | Freq: Four times a day (QID) | TRANSDERMAL | Status: DC

## 2015-11-11 MED ORDER — TRIAMCINOLONE ACETONIDE 40 MG/ML IJ SUSP
40.0000 mg | Freq: Once | INTRAMUSCULAR | Status: AC
  Administered 2015-11-11: 40 mg via INTRA_ARTICULAR

## 2015-11-11 MED ORDER — ACETAMINOPHEN-CODEINE #3 300-30 MG PO TABS: 1.0000 | ORAL_TABLET | Freq: Four times a day (QID) | ORAL | Status: DC | PRN

## 2015-11-11 NOTE — Progress Notes (Signed)
Subjective:  By signing my name below, I, Moises Blood, attest that this documentation has been prepared under the direction and in the presence of Delman Cheadle, MD. Electronically Signed: Moises Blood, [place]. 11/11/2015 , 10:48 AM .  Patient was seen in Room 10 .   Patient ID: Tammy Preston, female    DOB: 1945/12/08, 70 y.o.   MRN: [id number] Chief Complaint  Patient presents with  . Knee Pain    Left knee, x 3 weeks   HPI Tammy Preston is a 70 y.o. female who presents to [facility] complaining of left knee pain that started 3 weeks ago. She had right knee pain with multiple meniscal tears in 2009 with anserine bursitis effusion and baker's cyst. She had cortisone shot for relief in the past for her right knee pain.   Pt states that the area was very tender to palpation last night. It started in the back of the knee and it radiated to the jointline. She's taken tylenol without improvement. She denies having a knee brace at home. She notes that sometimes her knees would lock up. She denies weakness in her legs.   She mentions that she had similar problems in December and was given Tylenol #3 which "fixed them".   Past Medical History  Diagnosis Date  . Hypertension   . Diabetes mellitus without complication ([place])   . Hyperlipemia   . Breast cancer ([place])    Prior to Admission medications   Medication Sig Start Date End Date Taking? Authorizing Provider  aspirin EC 81 MG tablet Take 81 mg by mouth daily.   Yes Historical Provider, MD  Calcium Carb-Cholecalciferol (CALCIUM + D3 PO) Take by mouth.   Yes Historical Provider, MD  HYDROCHLOROTHIAZIDE PO Take by mouth.   Yes Historical Provider, MD  L-Methylfolate-B6-B12 (FOLTANX PO) Take by mouth. Reported on 11/11/2015    Historical Provider, MD  LOSARTAN POTASSIUM PO Take by mouth.   Yes Historical Provider, MD  METFORMIN HCL PO Take by mouth.   Yes Historical Provider, MD  Multiple Vitamins-Minerals (ONE-A-DAY WOMENS 50 PLUS PO) Take 1  tablet by mouth daily.   Yes Historical Provider, MD  SIMVASTATIN PO Take by mouth.   Yes Historical Provider, MD  traMADol (ULTRAM) 50 MG tablet Take 1 tablet (50 mg total) by mouth every 6 (six) hours as needed. Patient not taking: Reported on 11/11/2015 02/03/14   Antonietta Breach, PA-C   No Known Allergies  Review of Systems  Constitutional: Negative for fever, chills and fatigue.  Gastrointestinal: Negative for nausea, vomiting, diarrhea and constipation.  Musculoskeletal: Positive for arthralgias (left knee). Negative for joint swelling and gait problem.  Skin: Negative for rash and wound.  Neurological: Negative for weakness and numbness.       Objective:   Physical Exam  Constitutional: She is oriented to person, place, and time. She appears well-developed and well-nourished. No distress.  HENT:  Head: Normocephalic and atraumatic.  Eyes: EOM are normal. Pupils are equal, round, and reactive to light.  Neck: Neck supple.  Cardiovascular: Normal rate.   Pulmonary/Chest: Effort normal. No respiratory distress.  Musculoskeletal: Normal range of motion.  Mild crepitous in left knee, no significant effusion compared to right knee, no pain over lateral jointline but pain noticed over medial jointline, patellar stable, minimal pain over pes anserine bursa, negative anterior and posterior drawer, question of some laxity and pain with valgus stress more than varus, negative mcmurray's   Neurological: She is alert and oriented to  person, place, and time.  Skin: Skin is warm and dry.  Psychiatric: She has a normal mood and affect. Her behavior is normal.  Nursing note and vitals reviewed.   BP 128/70 mmHg  Pulse 82  Temp(Src) 98.2 F (36.8 C) (Oral)  Resp 16  Ht 5' 6.25" (1.683 m)  Wt 218 lb 6.4 oz (99.066 kg)  BMI 34.97 kg/m2  SpO2 98%  [facility] reading (PRIMARY) by Dr. Brigitte Pulse : left knee xray: medial arthritis   Risks/benefits of cortisone injection reviewed and verbal informed consent  obtained.  Knee positioned at 10 deg flexion, cleaned with betadine and EtOH.  Anesthesia w/ ethyl chloride cold spray. In superior lateral approach to suprapatellar bursa, injected with 40mg  of DepoMedrol and 4cc of 1% lidocaine using 20g 1 1/2in needle without complications. Pt tolerated procedure well. No EBL.     Assessment & Plan:   1. Left medial knee pain   2. Primary osteoarthritis of left knee   Suspect OA but can't r/o meniscal inj.  S/p initial cortisone inj today.  Responded very well to tylenol #3 prior so refilled small amount to use during flaires.  Try top voltaren.  Orders Placed This Encounter  Procedures  . DG Knee 1-2 Views Left    Standing Status: Future     Number of Occurrences: 1     Standing Expiration Date: 11/10/2016    Order Specific Question:  Reason for Exam (SYMPTOM  OR DIAGNOSIS REQUIRED)    Answer:  medial joint pain, suspect OA and ? meniscal tear    Order Specific Question:  Preferred imaging location?    Answer:  External    Meds ordered this encounter  Medications  . Calcium Carb-Cholecalciferol (CALCIUM + D3 PO)    Sig: Take by mouth.  . triamcinolone acetonide (KENALOG-40) injection 40 mg    Sig:   . acetaminophen-codeine (TYLENOL #3) 300-30 MG tablet    Sig: Take 1 tablet by mouth every 6 (six) hours as needed for moderate pain.    Dispense:  30 tablet    Refill:  0  . diclofenac sodium (VOLTAREN) 1 % GEL    Sig: Apply 2 g topically 4 (four) times daily.    Dispense:  100 g    Refill:  0    I personally performed the services described in this documentation, which was scribed in my presence. The recorded information has been reviewed and considered, and addended by me as needed.  Delman Cheadle, MD MPH

## 2015-11-11 NOTE — Patient Instructions (Addendum)
Rest the knee. Avoid positions and activities that place excessive pressure on the knee joint until pain and swelling resolve. Such activities include: squatting, kneeling, twisting and pivoting, repetitive bending (eg, stairs, getting out of a seated position, clutch and pedal pushing), jogging, dancing, and swimming using the frog or whip kick. ?Apply ice to the knee for 15 minutes every four to six hours, while keeping the leg elevated. ?if the pain is severe you may want to consider using crutches or a cane to help support yourself and take some pressure off of the knee ?A patellar restraining brace may be helpful if quadriceps strength is poor and the knee frequently "gives out."   Begin straight leg raising exercises without weights as the pain begins to wane with the goal of strengthening the quadriceps to provide support to the joint. Begin with sets of 10 leg lifts and gradually work up to 20 to 25 lifts, each held for five seconds. With improvement, light weights can be added to the ankle, beginning with a 2 pound weight and gradually increasing the weight to 5 to 10 pounds. In lieu of exercise weights, a heavy shoe or a bag containing one or more books may be used.  Exercise on equipment that requires deep knee bends against resistance, such as the stair stepper and rowing machine, should be avoided until pain and swelling resolve. Suitable exercises may include walking, swimming using a limited freestyle kick, water aerobics, walking or light jogging on a soft platform treadmill, and using a cross-country ski glide machine.  If you are still having significant pain beyond 4 -6 weeks and did not improve with the steroid injection, than we would want to proceed with an MRI and consider orthopedics or physical therapy.  Knee Injection A knee injection is a procedure to get medicine into your knee joint. Your health care provider puts a needle into the joint and injects medicine with an attached  syringe. The injected medicine may relieve the pain, swelling, and stiffness of arthritis. The injected medicine may also help to lubricate and cushion your knee joint. You may need more than one injection. LET [facility] CARE PROVIDER KNOW ABOUT:  Any allergies you have.  All medicines you are taking, including vitamins, herbs, eye drops, creams, and over-the-counter medicines.  Previous problems you or members of your family have had with the use of anesthetics.  Any blood disorders you have.  Previous surgeries you have had.  Any medical conditions you may have. RISKS AND COMPLICATIONS Generally, this is a safe procedure. However, problems may occur, including:  Infection.  Bleeding.  Worsening symptoms.  Damage to the area around your knee.  Allergic reaction to any of the medicines.  Skin reactions from repeated injections. BEFORE THE PROCEDURE  Ask your health care provider about changing or stopping your regular medicines. This is especially important if you are taking diabetes medicines or blood thinners.  Plan to have someone take you home after the procedure. PROCEDURE  You will sit or lie down in a position for your knee to be treated.  The skin over your kneecap will be cleaned with a germ-killing solution (antiseptic).  You will be given a medicine that numbs the area (local anesthetic). You may feel some stinging.  After your knee becomes numb, you will have a second injection. This is the medicine. This needle is carefully placed between your kneecap and your knee. The medicine is injected into the joint space.  At the end of  the procedure, the needle will be removed.  A bandage (dressing) may be placed over the injection site. The procedure may vary among health care providers and hospitals. AFTER THE PROCEDURE  You may have to move your knee through its full range of motion. This helps to get all of the medicine into your joint space.  Your blood  pressure, heart rate, breathing rate, and blood oxygen level will be monitored often until the medicines you were given have worn off.  You will be watched to make sure that you do not have a reaction to the injected medicine.   This information is not intended to replace advice given to you by your health care provider. Make sure you discuss any questions you have with your health care provider.   Document Released: 01/01/2007 Document Revised: 10/31/2014 Document Reviewed: 08/20/2014 Elsevier Interactive Patient Education 2016 Elsevier Inc.   Osteoarthritis Osteoarthritis is a disease that causes soreness and inflammation of a joint. It occurs when the cartilage at the affected joint wears down. Cartilage acts as a cushion, covering the ends of bones where they meet to form a joint. Osteoarthritis is the most common form of arthritis. It often occurs in older people. The joints affected most often by this condition include those in the:  Ends of the fingers.  Thumbs.  Neck.  Lower back.  Knees.  Hips. CAUSES  Over time, the cartilage that covers the ends of bones begins to wear away. This causes bone to rub on bone, producing pain and stiffness in the affected joints.  RISK FACTORS Certain factors can increase your chances of having osteoarthritis, including:  Older age.  Excessive body weight.  Overuse of joints.  Previous joint injury. SIGNS AND SYMPTOMS   Pain, swelling, and stiffness in the joint.  Over time, the joint may lose its normal shape.  Small deposits of bone (osteophytes) may grow on the edges of the joint.  Bits of bone or cartilage can break off and float inside the joint space. This may cause more pain and damage. DIAGNOSIS  Your health care provider will do a physical exam and ask about your symptoms. Various tests may be ordered, such as:  X-rays of the affected joint.  Blood tests to rule out other types of arthritis. Additional tests may be  used to diagnose your condition. TREATMENT  Goals of treatment are to control pain and improve joint function. Treatment plans may include:  A prescribed exercise program that allows for rest and joint relief.  A weight control plan.  Pain relief techniques, such as:  Properly applied heat and cold.  Electric pulses delivered to nerve endings under the skin (transcutaneous electrical nerve stimulation [TENS]).  Massage.  Certain nutritional supplements.  Medicines to control pain, such as:  Acetaminophen.  Nonsteroidal anti-inflammatory drugs (NSAIDs), such as naproxen.  Narcotic or central-acting agents, such as tramadol.  Corticosteroids. These can be given orally or as an injection.  Surgery to reposition the bones and relieve pain (osteotomy) or to remove loose pieces of bone and cartilage. Joint replacement may be needed in advanced states of osteoarthritis. HOME CARE INSTRUCTIONS   Take medicines only as directed by your health care provider.  Maintain a healthy weight. Follow your health care provider's instructions for weight control. This may include dietary instructions.  Exercise as directed. Your health care provider can recommend specific types of exercise. These may include:  Strengthening exercises. These are done to strengthen the muscles that support joints affected by  arthritis. They can be performed with weights or with exercise bands to add resistance.  Aerobic activities. These are exercises, such as brisk walking or low-impact aerobics, that get your heart pumping.  Range-of-motion activities. These keep your joints limber.  Balance and agility exercises. These help you maintain daily living skills.  Rest your affected joints as directed by your health care provider.  Keep all follow-up visits as directed by your health care provider. SEEK MEDICAL CARE IF:   Your skin turns red.  You develop a rash in addition to your joint pain.  You have  worsening joint pain.  You have a fever along with joint or muscle aches. SEEK IMMEDIATE MEDICAL CARE IF:  You have a significant loss of weight or appetite.  You have night sweats. [place] of Arthritis and Musculoskeletal and Skin Diseases: www.niams.SouthExposed.es  [employer] on Aging: http://kim-miller.com/  American College of Rheumatology: www.rheumatology.org   This information is not intended to replace advice given to you by your health care provider. Make sure you discuss any questions you have with your health care provider.   Document Released: 10/10/2005 Document Revised: 10/31/2014 Document Reviewed: 06/17/2013 Elsevier Interactive Patient Education [employer].

## 2015-11-24 ENCOUNTER — Ambulatory Visit (INDEPENDENT_AMBULATORY_CARE_PROVIDER_SITE_OTHER): Payer: Medicare Other | Admitting: Family Medicine

## 2015-11-24 VITALS — BP 124/80 | HR 104 | Temp 98.1°F | Resp 17 | Ht 66.5 in | Wt 217.0 lb

## 2015-11-24 DIAGNOSIS — M25512 Pain in left shoulder: Secondary | ICD-10-CM | POA: Diagnosis not present

## 2015-11-24 MED ORDER — MELOXICAM 7.5 MG PO TABS: 7.5000 mg | ORAL_TABLET | Freq: Every day | ORAL | Status: DC

## 2015-11-24 NOTE — Progress Notes (Signed)
   Subjective:    Patient ID: Tammy Preston, female    DOB: Oct 31, 1945, 70 y.o.   MRN: [id number]  HPI Patient with left sided shoulder pain for 5 days. She does not recall any injury or overuse. She has not had any falls. The pain was mild at first, then became progressively worse.  Pain is from shoulder to elbow. She had some pain medication at home which she took without relief. No previous injury/pain. No improvement with ice, some improvement with heating pad. She has some improvement in ROM and pain today. No numbness or tingling. She has been limiting movement since pain started.   She goes to [facility] for her regular care and has an appointment for her annual physical in 2 months.   Past Medical History  Diagnosis Date  . Hypertension   . Diabetes mellitus without complication ([place])   . Hyperlipemia   . Breast cancer [facility])    Past Surgical History  Procedure Laterality Date  . Back surgery    . Knee surgery    . Breast lumpectomy Left    Family History  Problem Relation Age of Onset  . Stroke Mother    Social History  Substance Use Topics  . Smoking status: Current Every Day Smoker -- 0.50 packs/day for 48 years  . Smokeless tobacco: None  . Alcohol Use: No     Review of Systems No cough, no chest pain, no SOB    Objective:   Physical Exam  Constitutional: She appears well-developed and well-nourished. No distress.  HENT:  Head: Normocephalic and atraumatic.  Eyes: Conjunctivae are normal.  Neck: Normal range of motion. Neck supple.  Cardiovascular: Normal rate, regular rhythm and normal heart sounds.   Pulmonary/Chest: Effort normal and breath sounds normal.  Musculoskeletal:       Right shoulder: She exhibits decreased range of motion (forward flexion), bony tenderness and pain. She exhibits no swelling.  Skin: She is not diaphoretic.  Vitals reviewed.   BP 124/80 mmHg  Pulse 104  Temp(Src) 98.1 F (36.7 C) (Oral)  Resp 17  Ht 5' 6.5"  (1.689 m)  Wt 217 lb (98.431 kg)  BMI 34.50 kg/m2  SpO2 99%      Assessment & Plan:  1. Left shoulder pain - suspect bursitis, it is improved today, instructed her to use heat several times a day, followed by gentle ROM - meloxicam (MOBIC) 7.5 MG tablet; Take 1 tablet (7.5 mg total) by mouth daily.  Dispense: 30 tablet; Refill: 0-- take for 5-7 days then PRN - RTC precautions- fever/worsening pain or decreased ROM   Clarene Reamer, FNP-BC  [facility]  11/27/2015 7:09 AM

## 2015-11-24 NOTE — Patient Instructions (Signed)
Please take the meloxicam medication once daily for 5-7 days, then as needed. Do not take any other anti-inflammatory drugs (ibuprofen or voltaren gel) while you are using this medication.  Use moist heat 3-4 times a day and follow with gentle range of motion exercises.    Bursitis Bursitis is inflammation and irritation of a bursa, which is one of the small, fluid-filled sacs that cushion and protect the moving parts of your body. These sacs are located between bones and muscles, muscle attachments, or skin areas next to bones. A bursa protects these structures from the wear and tear that results from frequent movement. An inflamed bursa causes pain and swelling. Fluid may build up inside the sac. Bursitis is most common near joints, especially the knees, elbows, hips, and shoulders. CAUSES Bursitis can be caused by:   Injury from:  A direct blow, like falling on your knee or elbow.  Overuse of a joint (repetitive stress).  Infection. This can happen if bacteria gets into a bursa through a cut or scrape near a joint.  Diseases that cause joint inflammation, such as gout and rheumatoid arthritis. RISK FACTORS You may be at risk for bursitis if you:   Have a job or hobby that involves a lot of repetitive stress on your joints.  Have a condition that weakens your body's defense system (immune system), such as diabetes, cancer, or HIV.  Lift and reach overhead often.  Kneel or lean on hard surfaces often.  Run or walk often. SIGNS AND SYMPTOMS The most common signs and symptoms of bursitis are:  Pain that gets worse when you move the affected body part or put weight on it.  Inflammation.  Stiffness. Other signs and symptoms may include:  Redness.  Tenderness.  Warmth.  Pain that continues after rest.  Fever and chills. This may occur in bursitis caused by infection. DIAGNOSIS Bursitis may be diagnosed by:   Medical history and physical exam.  MRI.  A procedure to  drain fluid from the bursa with a needle (aspiration). The fluid may be checked for signs of infection or gout.  Blood tests to rule out other causes of inflammation. TREATMENT  Bursitis can usually be treated at home with rest, ice, compression, and elevation (RICE). For mild bursitis, RICE treatment may be all you need. Other treatments may include:  Nonsteroidal anti-inflammatory drugs (NSAIDs) to treat pain and inflammation.  Corticosteroids to fight inflammation. You may have these drugs injected into and around the area of bursitis.  Aspiration of bursitis fluid to relieve pain and improve movement.  Antibiotic medicine to treat an infected bursa.  A splint, brace, or walking aid.  Physical therapy if you continue to have pain or limited movement.  Surgery to remove a damaged or infected bursa. This may be needed if you have a very bad case of bursitis or if other treatments have not worked. HOME CARE INSTRUCTIONS   Take medicines only as directed by your health care provider.  If you were prescribed an antibiotic medicine, finish it all even if you start to feel better.  Rest the affected area as directed by your health care provider.  Keep the area elevated.  Avoid activities that make pain worse.  Apply ice to the injured area:  Place ice in a plastic bag.  Place a towel between your skin and the bag.  Leave the ice on for 20 minutes, 2-3 times a day.  Use splints, braces, pads, or walking aids as directed by  your health care provider.  Keep all follow-up visits as directed by your health care provider. This is important. PREVENTION   Wear knee pads if you kneel often.  Wear sturdy running or walking shoes that fit you well.  Take regular breaks from repetitive activity.  Warm up by stretching before doing any strenuous activity.  Maintain a healthy weight or lose weight as recommended by your health care provider. Ask your health care provider if you need  help.  Exercise regularly. Start any new physical activity gradually. SEEK MEDICAL CARE IF:   Your bursitis is not responding to treatment or home care.  You have a fever.  You have chills.   This information is not intended to replace advice given to you by your health care provider. Make sure you discuss any questions you have with your health care provider.   Document Released: 10/07/2000 Document Revised: 07/01/2015 Document Reviewed: 12/30/2013 Elsevier Interactive Patient Education [employer].

## 2016-01-18 DIAGNOSIS — Z853 Personal history of malignant neoplasm of breast: Secondary | ICD-10-CM | POA: Diagnosis not present

## 2016-01-18 DIAGNOSIS — Z1231 Encounter for screening mammogram for malignant neoplasm of breast: Secondary | ICD-10-CM | POA: Diagnosis not present

## 2016-01-21 DIAGNOSIS — R928 Other abnormal and inconclusive findings on diagnostic imaging of breast: Secondary | ICD-10-CM | POA: Diagnosis not present

## 2016-01-21 DIAGNOSIS — Z853 Personal history of malignant neoplasm of breast: Secondary | ICD-10-CM | POA: Diagnosis not present

## 2016-01-21 DIAGNOSIS — R922 Inconclusive mammogram: Secondary | ICD-10-CM | POA: Diagnosis not present

## 2016-02-16 DIAGNOSIS — N183 Chronic kidney disease, stage 3 (moderate): Secondary | ICD-10-CM | POA: Diagnosis not present

## 2016-02-16 DIAGNOSIS — E1122 Type 2 diabetes mellitus with diabetic chronic kidney disease: Secondary | ICD-10-CM | POA: Diagnosis not present

## 2016-02-16 DIAGNOSIS — N08 Glomerular disorders in diseases classified elsewhere: Secondary | ICD-10-CM | POA: Diagnosis not present

## 2016-02-16 DIAGNOSIS — I129 Hypertensive chronic kidney disease with stage 1 through stage 4 chronic kidney disease, or unspecified chronic kidney disease: Secondary | ICD-10-CM | POA: Diagnosis not present

## 2016-03-23 DIAGNOSIS — N183 Chronic kidney disease, stage 3 (moderate): Secondary | ICD-10-CM | POA: Diagnosis not present

## 2016-03-23 DIAGNOSIS — N182 Chronic kidney disease, stage 2 (mild): Secondary | ICD-10-CM | POA: Diagnosis not present

## 2016-03-23 DIAGNOSIS — N2581 Secondary hyperparathyroidism of renal origin: Secondary | ICD-10-CM | POA: Diagnosis not present

## 2016-03-23 DIAGNOSIS — E1122 Type 2 diabetes mellitus with diabetic chronic kidney disease: Secondary | ICD-10-CM | POA: Diagnosis not present

## 2016-03-23 DIAGNOSIS — I129 Hypertensive chronic kidney disease with stage 1 through stage 4 chronic kidney disease, or unspecified chronic kidney disease: Secondary | ICD-10-CM | POA: Diagnosis not present

## 2016-03-30 ENCOUNTER — Other Ambulatory Visit: Payer: Self-pay | Admitting: Nephrology

## 2016-03-30 DIAGNOSIS — N183 Chronic kidney disease, stage 3 (moderate): Secondary | ICD-10-CM

## 2016-04-05 ENCOUNTER — Ambulatory Visit
Admission: RE | Admit: 2016-04-05 | Discharge: 2016-04-05 | Disposition: A | Discharge status: Home / Self Care | Payer: Medicare Other | Source: Ambulatory Visit | Attending: Nephrology | Admitting: Nephrology

## 2016-04-05 DIAGNOSIS — N189 Chronic kidney disease, unspecified: Secondary | ICD-10-CM | POA: Diagnosis not present

## 2016-04-05 DIAGNOSIS — N183 Chronic kidney disease, stage 3 (moderate): Secondary | ICD-10-CM

## 2016-06-06 DIAGNOSIS — N08 Glomerular disorders in diseases classified elsewhere: Secondary | ICD-10-CM | POA: Diagnosis not present

## 2016-06-06 DIAGNOSIS — E1122 Type 2 diabetes mellitus with diabetic chronic kidney disease: Secondary | ICD-10-CM | POA: Diagnosis not present

## 2016-06-06 DIAGNOSIS — Z0000 Encounter for general adult medical examination without abnormal findings: Secondary | ICD-10-CM | POA: Diagnosis not present

## 2016-06-06 DIAGNOSIS — I129 Hypertensive chronic kidney disease with stage 1 through stage 4 chronic kidney disease, or unspecified chronic kidney disease: Secondary | ICD-10-CM | POA: Diagnosis not present

## 2016-06-06 DIAGNOSIS — E559 Vitamin D deficiency, unspecified: Secondary | ICD-10-CM | POA: Diagnosis not present

## 2016-06-06 DIAGNOSIS — N183 Chronic kidney disease, stage 3 (moderate): Secondary | ICD-10-CM | POA: Diagnosis not present

## 2016-06-21 DIAGNOSIS — E1122 Type 2 diabetes mellitus with diabetic chronic kidney disease: Secondary | ICD-10-CM | POA: Diagnosis not present

## 2016-06-21 DIAGNOSIS — N183 Chronic kidney disease, stage 3 (moderate): Secondary | ICD-10-CM | POA: Diagnosis not present

## 2016-06-21 DIAGNOSIS — Z6834 Body mass index (BMI) 34.0-34.9, adult: Secondary | ICD-10-CM | POA: Diagnosis not present

## 2016-06-21 DIAGNOSIS — N2581 Secondary hyperparathyroidism of renal origin: Secondary | ICD-10-CM | POA: Diagnosis not present

## 2016-06-21 DIAGNOSIS — I129 Hypertensive chronic kidney disease with stage 1 through stage 4 chronic kidney disease, or unspecified chronic kidney disease: Secondary | ICD-10-CM | POA: Diagnosis not present

## 2016-06-21 DIAGNOSIS — F17200 Nicotine dependence, unspecified, uncomplicated: Secondary | ICD-10-CM | POA: Diagnosis not present

## 2016-07-22 DIAGNOSIS — H35039 Hypertensive retinopathy, unspecified eye: Secondary | ICD-10-CM | POA: Diagnosis not present

## 2016-07-22 DIAGNOSIS — H43813 Vitreous degeneration, bilateral: Secondary | ICD-10-CM | POA: Diagnosis not present

## 2016-07-22 DIAGNOSIS — E119 Type 2 diabetes mellitus without complications: Secondary | ICD-10-CM | POA: Diagnosis not present

## 2016-07-22 DIAGNOSIS — B589 Toxoplasmosis, unspecified: Secondary | ICD-10-CM | POA: Diagnosis not present

## 2016-07-25 DIAGNOSIS — R3121 Asymptomatic microscopic hematuria: Secondary | ICD-10-CM | POA: Diagnosis not present

## 2016-08-02 DIAGNOSIS — R3121 Asymptomatic microscopic hematuria: Secondary | ICD-10-CM | POA: Diagnosis not present

## 2016-08-02 DIAGNOSIS — R3129 Other microscopic hematuria: Secondary | ICD-10-CM | POA: Diagnosis not present

## 2016-09-28 DIAGNOSIS — R3121 Asymptomatic microscopic hematuria: Secondary | ICD-10-CM | POA: Diagnosis not present

## 2016-10-10 DIAGNOSIS — Z1389 Encounter for screening for other disorder: Secondary | ICD-10-CM | POA: Diagnosis not present

## 2016-10-10 DIAGNOSIS — N08 Glomerular disorders in diseases classified elsewhere: Secondary | ICD-10-CM | POA: Diagnosis not present

## 2016-10-10 DIAGNOSIS — N183 Chronic kidney disease, stage 3 (moderate): Secondary | ICD-10-CM | POA: Diagnosis not present

## 2016-10-10 DIAGNOSIS — E1122 Type 2 diabetes mellitus with diabetic chronic kidney disease: Secondary | ICD-10-CM | POA: Diagnosis not present

## 2016-10-10 DIAGNOSIS — R202 Paresthesia of skin: Secondary | ICD-10-CM | POA: Diagnosis not present

## 2016-10-10 DIAGNOSIS — I129 Hypertensive chronic kidney disease with stage 1 through stage 4 chronic kidney disease, or unspecified chronic kidney disease: Secondary | ICD-10-CM | POA: Diagnosis not present

## 2016-10-25 ENCOUNTER — Other Ambulatory Visit: Payer: Self-pay | Admitting: Internal Medicine

## 2016-10-25 DIAGNOSIS — D259 Leiomyoma of uterus, unspecified: Secondary | ICD-10-CM

## 2016-11-04 ENCOUNTER — Ambulatory Visit
Admission: RE | Admit: 2016-11-04 | Discharge: 2016-11-04 | Disposition: A | Discharge status: Home / Self Care | Payer: Medicare Other | Source: Ambulatory Visit | Attending: Internal Medicine | Admitting: Internal Medicine

## 2016-11-04 DIAGNOSIS — D259 Leiomyoma of uterus, unspecified: Secondary | ICD-10-CM | POA: Diagnosis not present

## 2017-01-23 DIAGNOSIS — Z1231 Encounter for screening mammogram for malignant neoplasm of breast: Secondary | ICD-10-CM | POA: Diagnosis not present

## 2017-01-26 DIAGNOSIS — N183 Chronic kidney disease, stage 3 (moderate): Secondary | ICD-10-CM | POA: Diagnosis not present

## 2017-01-26 DIAGNOSIS — N6001 Solitary cyst of right breast: Secondary | ICD-10-CM | POA: Diagnosis not present

## 2017-01-26 DIAGNOSIS — I129 Hypertensive chronic kidney disease with stage 1 through stage 4 chronic kidney disease, or unspecified chronic kidney disease: Secondary | ICD-10-CM | POA: Diagnosis not present

## 2017-01-26 DIAGNOSIS — E1122 Type 2 diabetes mellitus with diabetic chronic kidney disease: Secondary | ICD-10-CM | POA: Diagnosis not present

## 2017-01-26 DIAGNOSIS — Z853 Personal history of malignant neoplasm of breast: Secondary | ICD-10-CM | POA: Diagnosis not present

## 2017-01-26 DIAGNOSIS — N6311 Unspecified lump in the right breast, upper outer quadrant: Secondary | ICD-10-CM | POA: Diagnosis not present

## 2017-01-26 DIAGNOSIS — N08 Glomerular disorders in diseases classified elsewhere: Secondary | ICD-10-CM | POA: Diagnosis not present

## 2017-02-22 DIAGNOSIS — Z124 Encounter for screening for malignant neoplasm of cervix: Secondary | ICD-10-CM | POA: Diagnosis not present

## 2017-02-22 DIAGNOSIS — R1907 Generalized intra-abdominal and pelvic swelling, mass and lump: Secondary | ICD-10-CM | POA: Diagnosis not present

## 2017-02-22 DIAGNOSIS — R938 Abnormal findings on diagnostic imaging of other specified body structures: Secondary | ICD-10-CM | POA: Diagnosis not present

## 2017-02-22 DIAGNOSIS — D259 Leiomyoma of uterus, unspecified: Secondary | ICD-10-CM | POA: Diagnosis not present

## 2017-03-13 ENCOUNTER — Other Ambulatory Visit: Payer: Self-pay | Admitting: Obstetrics and Gynecology

## 2017-03-17 ENCOUNTER — Encounter (HOSPITAL_COMMUNITY): Payer: Self-pay | Admitting: *Deleted

## 2017-03-27 ENCOUNTER — Encounter (HOSPITAL_COMMUNITY): Payer: Self-pay

## 2017-03-27 ENCOUNTER — Encounter (HOSPITAL_COMMUNITY)
Admission: RE | Admit: 2017-03-27 | Discharge: 2017-03-27 | Disposition: A | Discharge status: Home / Self Care | Payer: Medicare Other | Source: Ambulatory Visit | Attending: Obstetrics and Gynecology | Admitting: Obstetrics and Gynecology

## 2017-03-27 DIAGNOSIS — E785 Hyperlipidemia, unspecified: Secondary | ICD-10-CM | POA: Diagnosis not present

## 2017-03-27 DIAGNOSIS — N183 Chronic kidney disease, stage 3 (moderate): Secondary | ICD-10-CM | POA: Insufficient documentation

## 2017-03-27 DIAGNOSIS — I129 Hypertensive chronic kidney disease with stage 1 through stage 4 chronic kidney disease, or unspecified chronic kidney disease: Secondary | ICD-10-CM | POA: Insufficient documentation

## 2017-03-27 DIAGNOSIS — R200 Anesthesia of skin: Secondary | ICD-10-CM | POA: Insufficient documentation

## 2017-03-27 DIAGNOSIS — Z853 Personal history of malignant neoplasm of breast: Secondary | ICD-10-CM | POA: Insufficient documentation

## 2017-03-27 DIAGNOSIS — E1122 Type 2 diabetes mellitus with diabetic chronic kidney disease: Secondary | ICD-10-CM | POA: Diagnosis not present

## 2017-03-27 DIAGNOSIS — Z01810 Encounter for preprocedural cardiovascular examination: Secondary | ICD-10-CM | POA: Diagnosis not present

## 2017-03-27 DIAGNOSIS — Z01812 Encounter for preprocedural laboratory examination: Secondary | ICD-10-CM | POA: Insufficient documentation

## 2017-03-27 HISTORY — DX: Anesthesia of skin: R20.0

## 2017-03-27 HISTORY — DX: Chronic kidney disease, unspecified: N18.9

## 2017-03-27 HISTORY — DX: Paresthesia of skin: R20.2

## 2017-03-27 LAB — CBC
HCT: 41.8 % (ref 36.0–46.0)
HEMOGLOBIN: 14 g/dL (ref 12.0–15.0)
MCH: 29.4 pg (ref 26.0–34.0)
MCHC: 33.5 g/dL (ref 30.0–36.0)
MCV: 87.8 fL (ref 78.0–100.0)
Platelets: 304 10*3/uL (ref 150–400)
RBC: 4.76 MIL/uL (ref 3.87–5.11)
RDW: 14.2 % (ref 11.5–15.5)
WBC: 13.7 10*3/uL — ABNORMAL HIGH (ref 4.0–10.5)

## 2017-03-27 LAB — BASIC METABOLIC PANEL
ANION GAP: 9 (ref 5–15)
BUN: 21 mg/dL — ABNORMAL HIGH (ref 6–20)
CALCIUM: 9.9 mg/dL (ref 8.9–10.3)
CO2: 28 mmol/L (ref 22–32)
Chloride: 102 mmol/L (ref 101–111)
Creatinine, Ser: 1.28 mg/dL — ABNORMAL HIGH (ref 0.44–1.00)
GFR, EST AFRICAN AMERICAN: 48 mL/min — AB (ref >60)
GFR, EST NON AFRICAN AMERICAN: 41 mL/min — AB (ref >60)
Glucose, Bld: 126 mg/dL — ABNORMAL HIGH (ref 65–99)
Potassium: 3.5 mmol/L (ref 3.5–5.1)
Sodium: 139 mmol/L (ref 135–145)

## 2017-03-27 NOTE — Patient Instructions (Signed)
Your procedure is scheduled on:  Friday, April 07, 2017  Enter through the [employer] [facility] at:  12:45 PM  Pick up the phone at the desk and dial [phone].  Call this number if you have problems the morning of surgery: [phone].  Remember: Do NOT eat food:  After 6:00 AM day of surgery  Do NOT drink clear liquids after:  10:00 AM day of surgery  Take these medicines the morning of surgery with a SIP OF WATER:  Hydrochlorothiazide, Losartan, Simvastatin  Stop ALL herbal medications and Aspirin at this time  Do NOT smoke the day of surgery.  Do NOT wear jewelry (body piercing), metal hair clips/bobby pins, make-up, or nail polish. Do NOT wear lotions, powders, or perfumes.  You may wear deodorant. Do NOT shave for 48 hours prior to surgery. Do NOT bring valuables to the hospital. Contacts, dentures, or bridgework may not be worn into surgery.  Have a responsible adult drive you home and stay with you for 24 hours after your procedure  Bring a copy of your healthcare power of attorney and living will documents.

## 2017-03-27 NOTE — Pre-Procedure Instructions (Signed)
Reviewed medical history with Dr. Smith Robert, anesthesia would prefer her surgery be scheduled earlier in the day if possible.  I spoke with [place] at Dr. Harvie Bridge office and made her aware.

## 2017-04-07 ENCOUNTER — Encounter (HOSPITAL_COMMUNITY): Payer: Self-pay

## 2017-04-07 ENCOUNTER — Ambulatory Visit (HOSPITAL_COMMUNITY)
Admission: RE | Admit: 2017-04-07 | Discharge: 2017-04-07 | Disposition: A | Discharge status: Home / Self Care | Payer: Medicare Other | Source: Ambulatory Visit | Attending: Obstetrics and Gynecology | Admitting: Obstetrics and Gynecology

## 2017-04-07 ENCOUNTER — Ambulatory Visit (HOSPITAL_COMMUNITY): Payer: Medicare Other | Admitting: Anesthesiology

## 2017-04-07 ENCOUNTER — Encounter (HOSPITAL_COMMUNITY)
Admission: RE | Disposition: A | Discharge status: Home / Self Care | Payer: Self-pay | Source: Ambulatory Visit | Attending: Obstetrics and Gynecology

## 2017-04-07 DIAGNOSIS — Z853 Personal history of malignant neoplasm of breast: Secondary | ICD-10-CM | POA: Insufficient documentation

## 2017-04-07 DIAGNOSIS — N183 Chronic kidney disease, stage 3 (moderate): Secondary | ICD-10-CM | POA: Insufficient documentation

## 2017-04-07 DIAGNOSIS — I129 Hypertensive chronic kidney disease with stage 1 through stage 4 chronic kidney disease, or unspecified chronic kidney disease: Secondary | ICD-10-CM | POA: Insufficient documentation

## 2017-04-07 DIAGNOSIS — N856 Intrauterine synechiae: Secondary | ICD-10-CM | POA: Diagnosis not present

## 2017-04-07 DIAGNOSIS — F17210 Nicotine dependence, cigarettes, uncomplicated: Secondary | ICD-10-CM | POA: Diagnosis not present

## 2017-04-07 DIAGNOSIS — N840 Polyp of corpus uteri: Secondary | ICD-10-CM | POA: Diagnosis not present

## 2017-04-07 DIAGNOSIS — Z885 Allergy status to narcotic agent status: Secondary | ICD-10-CM | POA: Insufficient documentation

## 2017-04-07 DIAGNOSIS — E785 Hyperlipidemia, unspecified: Secondary | ICD-10-CM | POA: Insufficient documentation

## 2017-04-07 DIAGNOSIS — Z79899 Other long term (current) drug therapy: Secondary | ICD-10-CM | POA: Diagnosis not present

## 2017-04-07 DIAGNOSIS — N858 Other specified noninflammatory disorders of uterus: Secondary | ICD-10-CM | POA: Diagnosis not present

## 2017-04-07 DIAGNOSIS — Z7984 Long term (current) use of oral hypoglycemic drugs: Secondary | ICD-10-CM | POA: Insufficient documentation

## 2017-04-07 DIAGNOSIS — N950 Postmenopausal bleeding: Secondary | ICD-10-CM | POA: Diagnosis not present

## 2017-04-07 DIAGNOSIS — Q512 Other doubling of uterus: Secondary | ICD-10-CM | POA: Diagnosis not present

## 2017-04-07 DIAGNOSIS — E1122 Type 2 diabetes mellitus with diabetic chronic kidney disease: Secondary | ICD-10-CM | POA: Diagnosis not present

## 2017-04-07 DIAGNOSIS — R938 Abnormal findings on diagnostic imaging of other specified body structures: Secondary | ICD-10-CM | POA: Diagnosis not present

## 2017-04-07 HISTORY — PX: DILATATION & CURETTAGE/HYSTEROSCOPY WITH MYOSURE: SHX6511

## 2017-04-07 LAB — GLUCOSE, CAPILLARY: Glucose-Capillary: 116 mg/dL — ABNORMAL HIGH (ref 65–99)

## 2017-04-07 SURGERY — DILATATION & CURETTAGE/HYSTEROSCOPY WITH MYOSURE
Anesthesia: General | Site: Vagina

## 2017-04-07 MED ORDER — EPHEDRINE SULFATE 50 MG/ML IJ SOLN
INTRAMUSCULAR | Status: DC | PRN
  Administered 2017-04-07: 10 mg via INTRAVENOUS

## 2017-04-07 MED ORDER — SODIUM CHLORIDE 0.9 % IR SOLN
Status: DC | PRN
  Administered 2017-04-07: 3000 mL

## 2017-04-07 MED ORDER — METOCLOPRAMIDE HCL 5 MG/ML IJ SOLN: 10.0000 mg | Freq: Once | INTRAMUSCULAR | Status: DC | PRN

## 2017-04-07 MED ORDER — ONDANSETRON HCL 4 MG/2ML IJ SOLN
INTRAMUSCULAR | Status: DC | PRN
  Administered 2017-04-07: 4 mg via INTRAVENOUS

## 2017-04-07 MED ORDER — MEPERIDINE HCL 25 MG/ML IJ SOLN: 6.2500 mg | INTRAMUSCULAR | Status: DC | PRN

## 2017-04-07 MED ORDER — SCOPOLAMINE 1 MG/3DAYS TD PT72: 1.0000 | MEDICATED_PATCH | Freq: Once | TRANSDERMAL | Status: DC

## 2017-04-07 MED ORDER — FENTANYL CITRATE (PF) 100 MCG/2ML IJ SOLN
INTRAMUSCULAR | Status: AC
  Filled 2017-04-07: qty 2

## 2017-04-07 MED ORDER — DEXAMETHASONE SODIUM PHOSPHATE 10 MG/ML IJ SOLN
INTRAMUSCULAR | Status: DC | PRN
  Administered 2017-04-07: 4 mg via INTRAVENOUS

## 2017-04-07 MED ORDER — LIDOCAINE HCL (CARDIAC) 20 MG/ML IV SOLN
INTRAVENOUS | Status: DC | PRN
  Administered 2017-04-07: 80 mg via INTRAVENOUS

## 2017-04-07 MED ORDER — PROPOFOL 10 MG/ML IV BOLUS
INTRAVENOUS | Status: AC
  Filled 2017-04-07: qty 20

## 2017-04-07 MED ORDER — FENTANYL CITRATE (PF) 100 MCG/2ML IJ SOLN: 25.0000 ug | INTRAMUSCULAR | Status: DC | PRN

## 2017-04-07 MED ORDER — DEXAMETHASONE SODIUM PHOSPHATE 4 MG/ML IJ SOLN
INTRAMUSCULAR | Status: AC
  Filled 2017-04-07: qty 1

## 2017-04-07 MED ORDER — LACTATED RINGERS IV SOLN
INTRAVENOUS | Status: DC
  Administered 2017-04-07: 12:00:00 via INTRAVENOUS

## 2017-04-07 MED ORDER — EPHEDRINE 5 MG/ML INJ
INTRAVENOUS | Status: AC
  Filled 2017-04-07: qty 10

## 2017-04-07 MED ORDER — LIDOCAINE HCL (CARDIAC) 20 MG/ML IV SOLN
INTRAVENOUS | Status: AC
  Filled 2017-04-07: qty 5

## 2017-04-07 MED ORDER — PROPOFOL 10 MG/ML IV BOLUS
INTRAVENOUS | Status: DC | PRN
  Administered 2017-04-07: 160 mg via INTRAVENOUS

## 2017-04-07 MED ORDER — LACTATED RINGERS IV SOLN: INTRAVENOUS | Status: DC

## 2017-04-07 MED ORDER — FENTANYL CITRATE (PF) 100 MCG/2ML IJ SOLN
INTRAMUSCULAR | Status: DC | PRN
  Administered 2017-04-07 (×2): 25 ug via INTRAVENOUS

## 2017-04-07 MED ORDER — ONDANSETRON HCL 4 MG/2ML IJ SOLN
INTRAMUSCULAR | Status: AC
  Filled 2017-04-07: qty 2

## 2017-04-07 SURGICAL SUPPLY — 18 items
CANISTER SUCT 3000ML PPV (MISCELLANEOUS) ×4 IMPLANT
CATH ROBINSON RED A/P 16FR (CATHETERS) ×2 IMPLANT
CLOTH BEACON ORANGE TIMEOUT ST (SAFETY) ×2 IMPLANT
CONTAINER PREFILL 10% NBF 60ML (FORM) ×4 IMPLANT
DEVICE MYOSURE LITE (MISCELLANEOUS) ×2 IMPLANT
DEVICE MYOSURE REACH (MISCELLANEOUS) IMPLANT
DILATOR CANAL MILEX (MISCELLANEOUS) ×2 IMPLANT
FILTER ARTHROSCOPY CONVERTOR (FILTER) ×2 IMPLANT
GLOVE BIOGEL PI IND STRL 7.0 (GLOVE) ×2 IMPLANT
GLOVE BIOGEL PI INDICATOR 7.0 (GLOVE) ×2
GLOVE ECLIPSE 6.5 STRL STRAW (GLOVE) ×2 IMPLANT
GOWN STRL REUS W/TWL LRG LVL3 (GOWN DISPOSABLE) ×4 IMPLANT
PACK VAGINAL MINOR WOMEN LF (CUSTOM PROCEDURE TRAY) ×2 IMPLANT
PAD OB MATERNITY 4.3X12.25 (PERSONAL CARE ITEMS) ×2 IMPLANT
SEAL ROD LENS SCOPE MYOSURE (ABLATOR) ×2 IMPLANT
TOWEL OR 17X24 6PK STRL BLUE (TOWEL DISPOSABLE) ×4 IMPLANT
TUBING AQUILEX INFLOW (TUBING) ×2 IMPLANT
TUBING AQUILEX OUTFLOW (TUBING) ×2 IMPLANT

## 2017-04-07 NOTE — H&P (Signed)
Tammy Preston is an 71 y.o. female presents for dx hysteroscopy, D&C, resection of endometrial mass due to endometrial thickening noted on sonogram  Pertinent Gynecological History: Menses: post-menopausal Bleeding: n/a Contraception: none DES exposure: denies Blood transfusions: none Sexually transmitted diseases: no past history Previous GYN Procedures: DNC  Last mammogram: normal Date: 01/2017 Last pap: normal Date:02/2017 OB History: G2P1011   Menstrual History: Menarche age: n/a No LMP recorded. Patient is postmenopausal.    Past Medical History:  Diagnosis Date  . Breast cancer ([place])   . Chronic kidney disease    CKD stage 3  . Diabetes mellitus without complication ([place])   . Hyperlipemia   . Hypertension   . Numbness and tingling of both feet     Past Surgical History:  Procedure Laterality Date  . BACK SURGERY    . BREAST LUMPECTOMY Left   . COLONOSCOPY    . KNEE SURGERY      Family History  Problem Relation Age of Onset  . Stroke Mother     Social History:  reports that she has been smoking Cigarettes.  She has a 12.00 pack-year smoking history. She has never used smokeless tobacco. She reports that she does not drink alcohol or use drugs.  Allergies:  Allergies  Allergen Reactions  . Percocet [Oxycodone-Acetaminophen] Nausea And Vomiting    No prescriptions prior to admission.    Review of Systems  All other systems reviewed and are negative.   There were no vitals taken for this visit. Physical Exam  Constitutional: She is oriented to person, place, and time. She appears well-developed and well-nourished.  HENT:  Head: Atraumatic.  Neck: Neck supple.  Cardiovascular: Regular rhythm.   Respiratory: Breath sounds normal.  GI: Bowel sounds are normal.  Genitourinary: Vagina normal and uterus normal.  Neurological: She is alert and oriented to person, place, and time.  Skin: Skin is warm and dry.  Psychiatric: She has a normal mood and  affect.    No results found for this or any previous visit (from the past 24 hour(s)).  No results found.  Assessment/Plan: Endometrial thickening Endometrial mass P) dx hysteroscopy, D&C, resection of endometrial mass Risk of surgery includes infection, bleeding, uterine perforation and its risk, thermal injury, fluid overload and its mgmt. All ? answered  Cherrill Scrima A 04/07/2017 12:35

## 2017-04-07 NOTE — Transfer of Care (Signed)
Immediate Anesthesia Transfer of Care Note  Patient: Tammy Preston  Procedure(s) Performed: Procedure(s): DILATATION & CURETTAGE/HYSTEROSCOPY WITH MYOSURE (N/A)  Patient Location: PACU  Anesthesia Type:General  Level of Consciousness: awake, alert  and oriented  Airway & Oxygen Therapy: Patient Spontanous Breathing and Patient connected to nasal cannula oxygen  Post-op Assessment: Report given to RN, Post -op Vital signs reviewed and stable and Patient moving all extremities  Post vital signs: Reviewed and stable  Last Vitals:  Vitals:   04/07/17 1210  BP: 137/77  Pulse: 84  Resp: 20  Temp: 36.7 C    Last Pain:  Vitals:   04/07/17 1210  TempSrc: Oral      Patients Stated Pain Goal: 3 (03/03/01 1117)  Complications: No apparent anesthesia complications

## 2017-04-07 NOTE — Anesthesia Postprocedure Evaluation (Signed)
Anesthesia Post Note  Patient: Tammy Preston  Procedure(s) Performed: Procedure(s) (LRB): DILATATION & CURETTAGE/HYSTEROSCOPY WITH MYOSURE (N/A)     Patient location during evaluation: PACU Anesthesia Type: General Level of consciousness: awake and alert Pain management: pain level controlled Vital Signs Assessment: post-procedure vital signs reviewed and stable Respiratory status: spontaneous breathing, nonlabored ventilation, respiratory function stable and patient connected to nasal cannula oxygen Cardiovascular status: blood pressure returned to baseline and stable Postop Assessment: no signs of nausea or vomiting Anesthetic complications: no    Last Vitals:  Vitals:   04/07/17 1530 04/07/17 1608  BP: 138/77 130/80  Pulse: 65 69  Resp: 16 16  Temp: 36.3 C 36.4 C    Last Pain:  Vitals:   04/07/17 1608  TempSrc:   PainSc: 0-No pain   Pain Goal: Patients Stated Pain Goal: 3 (04/07/17 1530)               Montez Hageman

## 2017-04-07 NOTE — Anesthesia Procedure Notes (Signed)
Procedure Name: LMA Insertion Date/Time: 04/07/2017 1:37 PM Performed by: Hewitt Blade Pre-anesthesia Checklist: Patient identified, Emergency Drugs available, Suction available and Patient being monitored Patient Re-evaluated:Patient Re-evaluated prior to inductionOxygen Delivery Method: Circle system utilized Preoxygenation: Pre-oxygenation with 100% oxygen Intubation Type: IV induction LMA: LMA inserted LMA Size: 4.0 Number of attempts: 1 Placement Confirmation: positive ETCO2 and breath sounds checked- equal and bilateral Tube secured with: Tape Dental Injury: Teeth and Oropharynx as per pre-operative assessment

## 2017-04-07 NOTE — Brief Op Note (Signed)
04/07/2017  2:29 PM  PATIENT:  Tammy Preston  71 y.o. 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  PROCEDURE: diagnostic hysteroscopy, D&C  SURGEON:  Surgeon(s) and Role:    * Servando Salina, MD - Primary  PHYSICIAN ASSISTANT:   ASSISTANTS: none   ANESTHESIA:   general Findings: endometrial cavity with septum, scar tissue EBL:  Total I/O In: 300 [I.V.:300] Out: 125 [Urine:100; Blood:25]  BLOOD ADMINISTERED:none  DRAINS: none   LOCAL MEDICATIONS USED:  NONE  SPECIMEN:  Source of Specimen:  emc  DISPOSITION OF SPECIMEN:  PATHOLOGY  COUNTS:  YES  TOURNIQUET:  * No tourniquets in log *  DICTATION: .Other Dictation: Dictation Number [id number]  PLAN OF CARE: Discharge to home after PACU  PATIENT DISPOSITION:  PACU - hemodynamically stable.   Delay start of Pharmacological VTE agent (>24hrs) due to surgical blood loss or risk of bleeding: not applicable

## 2017-04-07 NOTE — Discharge Instructions (Addendum)
DISCHARGE INSTRUCTIONS: HYSTEROSCOPY / ENDOMETRIAL ABLATION The following instructions have been prepared to help you care for yourself upon your return home.  May take stool softner while taking narcotic pain medication to prevent constipation.  Drink plenty of water.  Personal hygiene:  Use sanitary pads for vaginal drainage, not tampons.  Shower the day after your procedure.  NO tub baths, pools or Jacuzzis for 2-3 weeks.  Wipe front to back after using the bathroom.  Activity and limitations:  Do NOT drive or operate any equipment for 24 hours. The effects of anesthesia are still present and drowsiness may result.  Do NOT rest in bed all day.  Walking is encouraged.  Walk up and down stairs slowly.  You may resume your normal activity in one to two days or as indicated by your physician. Sexual activity: NO intercourse for at least 2 weeks after the procedure, or as indicated by your Doctor.  Diet: Eat a light meal as desired this evening. You may resume your usual diet tomorrow.  Return to Work: You may resume your work activities in one to two days or as indicated by Marine scientist.  What to expect after your surgery: Expect to have vaginal bleeding/discharge for 2-3 days and spotting for up to 10 days. It is not unusual to have soreness for up to 1-2 weeks. You may have a slight burning sensation when you urinate for the first day. Mild cramps may continue for a couple of days. You may have a regular period in 2-6 weeks.  Call your doctor for any of the following:  Excessive vaginal bleeding or clotting, saturating and changing one pad every hour.  Inability to urinate 6 hours after discharge from hospital.  Pain not relieved by pain medication.  Fever of 100.4 F or greater.  Unusual vaginal discharge or odor.  Return to office _________________Call for an appointment ___________________ Patients signature: ______________________ Nurses signature  ________________________  Post Anesthesia Care Unit [phone]   Post Anesthesia Home Care Instructions  Activity: Get plenty of rest for the remainder of the day. A responsible individual must stay with you for 24 hours following the procedure.  For the next 24 hours, DO NOT: -Drive a car -Paediatric nurse -Drink alcoholic beverages -Take any medication unless instructed by your physician -Make any legal decisions or sign important papers.  Meals: Start with liquid foods such as gelatin or soup. Progress to regular foods as tolerated. Avoid greasy, spicy, heavy foods. If nausea and/or vomiting occur, drink only clear liquids until the nausea and/or vomiting subsides. Call your physician if vomiting continues.  Special Instructions/Symptoms: Your throat may feel dry or sore from the anesthesia or the breathing tube placed in your throat during surgery. If this causes discomfort, gargle with warm salt water. The discomfort should disappear within 24 hours.  I

## 2017-04-07 NOTE — Anesthesia Preprocedure Evaluation (Signed)
Anesthesia Evaluation  Patient identified by MRN, date of birth, ID band Patient awake    Reviewed: Allergy & Precautions, NPO status , Patient's Chart, lab work & pertinent test results  Airway Mallampati: II  TM Distance: >3 FB Neck ROM: Full    Dental no notable dental hx.    Pulmonary Current Smoker,    Pulmonary exam normal breath sounds clear to auscultation       Cardiovascular hypertension, Pt. on medications and Pt. on home beta blockers Normal cardiovascular exam Rhythm:Regular Rate:Normal     Neuro/Psych negative neurological ROS  negative psych ROS   GI/Hepatic negative GI ROS, Neg liver ROS,   Endo/Other  diabetes, Type 2, Oral Hypoglycemic Agents  Renal/GU negative Renal ROS  negative genitourinary   Musculoskeletal negative musculoskeletal ROS (+)   Abdominal   Peds negative pediatric ROS (+)  Hematology negative hematology ROS (+)   Anesthesia Other Findings   Reproductive/Obstetrics negative OB ROS                             Anesthesia Physical Anesthesia Plan  ASA: II  Anesthesia Plan: General   Post-op Pain Management:    Induction: Intravenous  PONV Risk Score and Plan: 2 and Ondansetron and Dexamethasone  Airway Management Planned: LMA  Additional Equipment:   Intra-op Plan:   Post-operative Plan: Extubation in OR  Informed Consent: I have reviewed the patients History and Physical, chart, labs and discussed the procedure including the risks, benefits and alternatives for the proposed anesthesia with the patient or authorized representative who has indicated his/her understanding and acceptance.   Dental advisory given  Plan Discussed with: CRNA  Anesthesia Plan Comments:         Anesthesia Quick Evaluation

## 2017-04-08 ENCOUNTER — Encounter (HOSPITAL_COMMUNITY): Payer: Self-pay | Admitting: Obstetrics and Gynecology

## 2017-04-10 NOTE — Op Note (Signed)
NAME:  Tammy Preston, Tammy Preston                    ACCOUNT NO.:  MEDICAL RECORD NO.:  [id number]  LOCATION:                                 FACILITY:  PHYSICIAN:  Servando Salina, M.D.    DATE OF BIRTH:  DATE OF PROCEDURE:  04/07/2017 DATE OF DISCHARGE:                              OPERATIVE REPORT   PREOPERATIVE DIAGNOSES:  Endometrial thickening on sonogram, endometrial mass.  PROCEDURES:  Diagnostic hysteroscopy, dilation and curettage.  POSTOPERATIVE DIAGNOSES:  Uterine synechiae, endometrial thickening on sonogram.  ANESTHESIA:  General.  SURGEON:  Servando Salina, M.D.  ASSISTANT:  None.  DESCRIPTION OF PROCEDURE:  Under general anesthesia, the patient was placed in dorsal lithotomy position.  She was sterilely prepped and draped in usual fashion.  The bladder was catheterized for moderate amount of urine.  Examination under anesthesia revealed anteverted uterus.  No adnexal masses could be appreciated.  A bivalve speculum was placed in the vagina.  Single-tooth tenaculum was placed on the anterior lip of the cervix.  Attempted initially dilating the internal os was unsuccessful.  There, an os binder was found followed by dilator.  The diagnostic hysteroscope was introduced into the uterine cavity gently and at that point, it was then noted that there was what appeared to be a potential septum as well as scar tissue on either side of the septum. Using the light resectoscope, it was gently inserted to obtain small pieces of the wall on either side.  At that point, the septum was not resected nor was there any attempt to try to go further up into the cavity given the findings.  At that point, the procedure was terminated by removing all instruments.  Scraping was not done due to the concern with the way the cavity was presented.  All instruments were then removed from the vagina.  Specimen was endometrial curetting from the resectoscope.  Estimated blood loss was 20  mL.  COMPLICATIONS:  None.  The patient tolerated the procedure well, was transferred to the recovery room in stable condition.     Servando Salina, M.D.    /MEDQ  D:  04/07/2017  T:  04/07/2017  Job:  [id number]

## 2017-05-07 ENCOUNTER — Emergency Department (HOSPITAL_COMMUNITY)
Admission: EM | Admit: 2017-05-07 | Discharge: 2017-05-07 | Disposition: A | Discharge status: Home / Self Care | Payer: Medicare Other | Attending: Emergency Medicine | Admitting: Emergency Medicine

## 2017-05-07 ENCOUNTER — Encounter (HOSPITAL_COMMUNITY): Payer: Self-pay

## 2017-05-07 DIAGNOSIS — Z7982 Long term (current) use of aspirin: Secondary | ICD-10-CM | POA: Insufficient documentation

## 2017-05-07 DIAGNOSIS — M25531 Pain in right wrist: Secondary | ICD-10-CM | POA: Insufficient documentation

## 2017-05-07 DIAGNOSIS — Z853 Personal history of malignant neoplasm of breast: Secondary | ICD-10-CM | POA: Insufficient documentation

## 2017-05-07 DIAGNOSIS — I129 Hypertensive chronic kidney disease with stage 1 through stage 4 chronic kidney disease, or unspecified chronic kidney disease: Secondary | ICD-10-CM | POA: Diagnosis not present

## 2017-05-07 DIAGNOSIS — M25431 Effusion, right wrist: Secondary | ICD-10-CM | POA: Diagnosis not present

## 2017-05-07 DIAGNOSIS — F17210 Nicotine dependence, cigarettes, uncomplicated: Secondary | ICD-10-CM | POA: Insufficient documentation

## 2017-05-07 DIAGNOSIS — Z7984 Long term (current) use of oral hypoglycemic drugs: Secondary | ICD-10-CM | POA: Diagnosis not present

## 2017-05-07 DIAGNOSIS — N183 Chronic kidney disease, stage 3 (moderate): Secondary | ICD-10-CM | POA: Diagnosis not present

## 2017-05-07 DIAGNOSIS — E1122 Type 2 diabetes mellitus with diabetic chronic kidney disease: Secondary | ICD-10-CM | POA: Diagnosis not present

## 2017-05-07 MED ORDER — HYDROCODONE-ACETAMINOPHEN 5-325 MG PO TABS: 1.0000 | ORAL_TABLET | Freq: Four times a day (QID) | ORAL | 0 refills | Status: DC | PRN

## 2017-05-07 NOTE — ED Provider Notes (Signed)
[facility] DEPT Provider Note: Georgena Spurling, MD, FACEP  CSN: [id number] MRN: [id number] ARRIVAL: 05/07/17 at [place]: [place]  Tammy Preston is a 71 y.o. female who complains of pain and swelling in her right wrist since 11 AM yesterday morning. She denies injury. There is a cystlike mass about 3 or 4 centimeters proximal to the wrist flexure on the volar side that is tender. Pain is exacerbated by flexion or extension of the right wrist or with palpation of this cystlike area. Pain has varied from mild to severe. She has taken Tylenol with partial relief. Her pain is in 8 out of 10 at the present time. She had numbness of her fingers earlier but this has resolved.  Consultation with the [facility] state controlled substances database reveals the patient has received no opioid prescriptions in the past year.   Past Medical History:  Diagnosis Date  . Breast cancer ([place])   . Chronic kidney disease    CKD stage 3  . Diabetes mellitus without complication ([place])   . Hyperlipemia   . Hypertension   . Numbness and tingling of both feet     Past Surgical History:  Procedure Laterality Date  . BACK SURGERY    . BREAST LUMPECTOMY Left   . COLONOSCOPY    . DILATATION & CURETTAGE/HYSTEROSCOPY WITH MYOSURE N/A 04/07/2017   Procedure: DILATATION & CURETTAGE/HYSTEROSCOPY WITH MYOSURE;  Surgeon: Servando Salina, MD;  Location: [facility];  Service: Gynecology;  Laterality: N/A;  . KNEE SURGERY      Family History  Problem Relation Age of Onset  . Stroke Mother     Social History  Substance Use Topics  . Smoking status: Current Every Day Smoker    Packs/day: 0.25    Years: 48.00    Types: Cigarettes  . Smokeless tobacco: Never Used  . Alcohol use No    Prior to Admission medications   Medication Sig Start Date End Date Taking? Authorizing Provider  aspirin EC 81 MG tablet Take 81 mg by mouth daily after  breakfast.    Yes [provider]  Calcium Carb-Cholecalciferol (CALCIUM + D3 PO) Take 1 tablet by mouth daily.    Yes [provider]  Cholecalciferol (VITAMIN D) 2000 units tablet Take 2,000 Units by mouth daily.   Yes [provider]  hydrochlorothiazide (HYDRODIURIL) 25 MG tablet Take 25 mg by mouth daily after breakfast.    Yes [provider]  L-Methylfolate-B6-B12 (FOLTANX) 3-35-2 MG TABS Take 1 tablet by mouth daily. 01/26/17 12/28/17 Yes [provider]  losartan (COZAAR) 25 MG tablet Take 25 mg by mouth daily after breakfast.    Yes [provider]  metFORMIN (GLUCOPHAGE) 500 MG tablet Take 500 mg by mouth daily after breakfast.    Yes [provider]  Multiple Vitamins-Minerals (ONE-A-DAY WOMENS 50 PLUS PO) Take 1 tablet by mouth daily.   Yes [provider]  simvastatin (ZOCOR) 20 MG tablet Take 20 mg by mouth daily after breakfast.    Yes [provider]    Allergies Percocet [oxycodone-acetaminophen] and Shellfish allergy   REVIEW OF SYSTEMS  Negative except as noted here or in the History of Present Illness.   PHYSICAL EXAMINATION  Initial Vital Signs Blood pressure (!) 156/83, pulse 78, temperature 98.5 F (36.9 C), temperature source Oral, resp. rate 20, height 5\' 6"  (1.676 m), weight 93.9 kg (207 lb), SpO2 100 %.  Examination General: Well-developed, well-nourished female in no acute distress; appearance consistent with age of record HENT: normocephalic; atraumatic Eyes: Normal appearance Neck: supple Heart: regular rate and rhythm Lungs: clear to auscultation bilaterally Abdomen: soft; nondistended; nontender; bowel sounds present Extremities: No deformity; mild circumferential edema of right wrist just proximal to the wrist flexure with cystlike mass proximal to the wrist flexure with tenderness to palpation and pain on flexion and extension of the right wrist, there is no erythema or  warmth, the right hand is distally neurovascularly intact Neurologic: Awake, alert and oriented; motor function intact in all extremities and symmetric; no facial droop Skin: Warm and dry Psychiatric: Normal mood and affect   RESULTS  Summary of this visit's results, reviewed by myself:   EKG Interpretation  Date/Time:    Ventricular Rate:    PR Interval:    QRS Duration:   QT Interval:    QTC Calculation:   R Axis:     Text Interpretation:        Laboratory Studies: No results found for this or any previous visit (from the past 24 hour(s)). Imaging Studies: No results found.  ED COURSE  Nursing notes and initial vitals signs, including pulse oximetry, reviewed.  Vitals:   05/07/17 0047 05/07/17 0049 05/07/17 0330  BP: (!) 158/97  (!) 156/83  Pulse: (!) 102  78  Resp: 18  20  Temp: 97.8 F (36.6 C)  98.5 F (36.9 C)  TempSrc: Oral  Oral  SpO2: 100%  100%  Weight:  93.9 kg (207 lb)   Height:  5\' 6"  (1.676 m)    The mass and the patient's wrist may represent a ganglion cyst. She has a negative Tinel's test and I do not believe this represents acute carpal tunnel syndrome. We will splint and refer her to hand surgery.  PROCEDURES    ED DIAGNOSES     ICD-10-CM   1. Pain and swelling of right wrist M25.531    M25.431        Ogden Handlin, Jenny Reichmann, MD 05/07/17 [phone]

## 2017-05-07 NOTE — ED Triage Notes (Signed)
Pt presents with c/o right hand pain. Pt denies any injury to that hand but reports that it was hurting when she woke up this morning. Pt has no neuro deficits but she is unable to squeeze with her right hand because of the pain. Pt reports some numbness to a few fingers on that hand but also reports she believes she has diabetic neuropathy.

## 2017-05-10 DIAGNOSIS — M79642 Pain in left hand: Secondary | ICD-10-CM | POA: Diagnosis not present

## 2017-05-20 ENCOUNTER — Ambulatory Visit (INDEPENDENT_AMBULATORY_CARE_PROVIDER_SITE_OTHER): Payer: Medicare Other | Admitting: Osteopathic Medicine

## 2017-05-20 ENCOUNTER — Ambulatory Visit (INDEPENDENT_AMBULATORY_CARE_PROVIDER_SITE_OTHER): Payer: Medicare Other

## 2017-05-20 ENCOUNTER — Encounter: Payer: Self-pay | Admitting: Osteopathic Medicine

## 2017-05-20 VITALS — BP 144/77 | HR 100 | Temp 97.7°F | Resp 18 | Ht 66.0 in | Wt 205.0 lb

## 2017-05-20 DIAGNOSIS — M79644 Pain in right finger(s): Secondary | ICD-10-CM | POA: Diagnosis not present

## 2017-05-20 DIAGNOSIS — N08 Glomerular disorders in diseases classified elsewhere: Secondary | ICD-10-CM

## 2017-05-20 DIAGNOSIS — N183 Chronic kidney disease, stage 3 unspecified: Secondary | ICD-10-CM

## 2017-05-20 DIAGNOSIS — N1831 Chronic kidney disease, stage 3a: Secondary | ICD-10-CM | POA: Insufficient documentation

## 2017-05-20 DIAGNOSIS — I129 Hypertensive chronic kidney disease with stage 1 through stage 4 chronic kidney disease, or unspecified chronic kidney disease: Secondary | ICD-10-CM

## 2017-05-20 DIAGNOSIS — M659 Synovitis and tenosynovitis, unspecified: Secondary | ICD-10-CM

## 2017-05-20 DIAGNOSIS — N8500 Endometrial hyperplasia, unspecified: Secondary | ICD-10-CM | POA: Insufficient documentation

## 2017-05-20 DIAGNOSIS — E1122 Type 2 diabetes mellitus with diabetic chronic kidney disease: Secondary | ICD-10-CM

## 2017-05-20 DIAGNOSIS — M79641 Pain in right hand: Secondary | ICD-10-CM | POA: Diagnosis not present

## 2017-05-20 HISTORY — DX: Chronic kidney disease, stage 3 unspecified: N18.30

## 2017-05-20 HISTORY — DX: Type 2 diabetes mellitus with diabetic chronic kidney disease: E11.22

## 2017-05-20 HISTORY — DX: Endometrial hyperplasia, unspecified: N85.00

## 2017-05-20 HISTORY — DX: Hypertensive chronic kidney disease with stage 1 through stage 4 chronic kidney disease, or unspecified chronic kidney disease: I12.9

## 2017-05-20 HISTORY — DX: Glomerular disorders in diseases classified elsewhere: N08

## 2017-05-20 MED ORDER — GABAPENTIN 300 MG PO CAPS: 300.0000 mg | ORAL_CAPSULE | Freq: Three times a day (TID) | ORAL | 1 refills | Status: DC | PRN

## 2017-05-20 MED ORDER — DICLOFENAC SODIUM 1 % TD GEL: 2.0000 g | Freq: Four times a day (QID) | TRANSDERMAL | 1 refills | Status: DC

## 2017-05-20 MED ORDER — METHYLPREDNISOLONE 4 MG PO TBPK: ORAL_TABLET | ORAL | 0 refills | Status: DC

## 2017-05-20 NOTE — Progress Notes (Signed)
HPI: Tammy Preston is a 71 y.o. female  who presents to Primary Care at [facility] today, 05/20/17,  for chief complaint of:  Chief Complaint  Patient presents with  . Hand Pain    Right hand    . Context:Seen recently at emergency department, 05/07/2017, 20 days ago, with complaints of pain and swelling in right hand/wrist. Records reviewed: There is some concern for ganglion cyst, acute carpal tunnel - patient was referred to hand surgery. No records available from that visit but patient states she was compliant with the hand brace, the hand surgeon just looked at it and told her to take anti-inflammatories and come back if the swelling recurred. She doesn't want to go back to this hand surgeon unless chips has to. . Location: At this point complaining of pain in third and fourth digits on palmar aspect of right hand, .  . Quality: feels like sandpaper w/ swelling . Duration: Worse over the past few days the present altogether maybe about a month . Modifying factors: Has been taking ibuprofen as directed by hand specialist, has not been using wrist splints since at this point fingers are more of an issue than her wrist.   Past medical, surgical, social and family history reviewed: Patient Active Problem List   Diagnosis Date Noted  . Chronic kidney disease, stage 3 (moderate) 05/20/2017  . Endometrial hyperplasia 05/20/2017  . Hypertensive chronic kidney disease with stage 1 through stage 4 chronic kidney disease, or unspecified chronic kidney disease 05/20/2017  . 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 Type 2 diabetes mellitus with diabetic chronic kidney disease ([place]) 05/20/2017   Past Surgical History:  Procedure Laterality Date  . BACK SURGERY    . BREAST LUMPECTOMY Left   . COLONOSCOPY    . DILATATION & CURETTAGE/HYSTEROSCOPY WITH MYOSURE N/A 04/07/2017   Procedure: DILATATION & CURETTAGE/HYSTEROSCOPY WITH MYOSURE;  Surgeon: Servando Salina, MD;   Location: [facility];  Service: Gynecology;  Laterality: N/A;  . KNEE SURGERY     Social History  Substance Use Topics  . Smoking status: Current Every Day Smoker    Packs/day: 0.25    Years: 48.00    Types: Cigarettes  . Smokeless tobacco: Never Used  . Alcohol use No   Family History  Problem Relation Age of Onset  . Stroke Mother      Current medication list and allergy/intolerance information reviewed:   Current Outpatient Prescriptions  Medication Sig Dispense Refill  . aspirin EC 81 MG tablet Take 81 mg by mouth daily after breakfast.     . Calcium Carb-Cholecalciferol (CALCIUM + D3 PO) Take 1 tablet by mouth daily.     . Cholecalciferol (VITAMIN D) 2000 units tablet Take 2,000 Units by mouth daily.    . hydrochlorothiazide (HYDRODIURIL) 25 MG tablet Take 25 mg by mouth daily after breakfast.     . L-Methylfolate-B6-B12 (FOLTANX) 3-35-2 MG TABS Take 1 tablet by mouth daily.    Marland Kitchen losartan (COZAAR) 25 MG tablet Take 25 mg by mouth daily after breakfast.     . metFORMIN (GLUCOPHAGE) 500 MG tablet Take 500 mg by mouth daily after breakfast.     . Multiple Vitamins-Minerals (ONE-A-DAY WOMENS 50 PLUS PO) Take 1 tablet by mouth daily.    . simvastatin (ZOCOR) 20 MG tablet Take 20 mg by mouth daily after breakfast.     . HYDROcodone-acetaminophen (NORCO) 5-325 MG tablet Take 1 tablet by mouth every 6 (six) hours as needed (for pain). (Patient  not taking: Reported on 05/20/2017) 20 tablet 0   No current facility-administered medications for this visit.    Allergies  Allergen Reactions  . Percocet [Oxycodone-Acetaminophen] Nausea And Vomiting  . Shellfish Allergy Other (See Comments)    Causes Gout      Review of Systems:  Constitutional:  No  fever, no chills, No recent illness  Cardiac: No  chest pain, No  pressure,  Respiratory:  No  shortness of breath.   Gastrointestinal: No  abdominal pain  Musculoskeletal: +myalgia/arthralgia  Skin: No  Rash, No other  wounds/concerning lesions  Neurologic: No  weakness, No  dizziness  Exam:  BP (!) 144/77   Pulse 100   Temp 97.7 F (36.5 C) (Oral)   Resp 18   Ht 5\' 6"  (1.676 m)   Wt 205 lb (93 kg)   SpO2 100%   BMI 33.09 kg/m   Constitutional: VS see above. General Appearance: alert, well-developed, well-nourished, NAD  Eyes: Normal lids and conjunctive, non-icteric sclera  Neck: No masses, trachea midline.   Respiratory: Normal respiratory effort.  Musculoskeletal: Gait normal. 3rd and 4th digits of right hand tender on palmar aspect, no joint effusion or ecchymosis, normal range of motion passively, active range of motion somewhat limited to flexion due to pain  Neurological: Normal balance/coordination. No tremor.   Skin: warm, dry, intact. No rash/ulcer.   Psychiatric: Normal judgment/insight. Normal mood and affect. Oriented x3.   X-ray hand personally reviewed, no concerning findings that I can see. Radiology over read as below  Dg Hand Complete Right  Result Date: 05/20/2017 CLINICAL DATA:  Right hand pain for 3 weeks.  No known injury. EXAM: RIGHT HAND - COMPLETE 3+ VIEW COMPARISON:  None. FINDINGS: Mild degenerative changes at the first carpometacarpal joint. No acute bony abnormality. Specifically, no fracture, subluxation, or dislocation. Soft tissues are intact. No bony erosions. IMPRESSION: No acute bony abnormality. Electronically Signed   By: Rolm Baptise M.D.   On: 05/20/2017 08:29     ASSESSMENT/PLAN: Patient was dissatisfied with hand surgeon would like to avoid going back to the specialist if possible. I think she may benefit from sports medicine evaluation, possible ultrasound in the office plus or minus injection to tendon sheath. Considered splinting of fingers for immobilization but patient states that the skin is quite tender and she would rather not do that. Medications as below, most recent sugars reviewed in 120s, no recent A1c available. Kidney function  decreased, will avoid systemic NSAIDs above ibuprofen 200-400 mg as needed and try switching to steroids and topical therapy.  Pain in right finger(s) - Plan: DG Hand Complete Right, Ambulatory referral to Sports Medicine  Tenosynovitis - Plan: gabapentin (NEURONTIN) 300 MG capsule, diclofenac sodium (VOLTAREN) 1 % GEL, Ambulatory referral to Sports Medicine, methylPREDNISolone (MEDROL DOSEPAK) 4 MG TBPK tablet    Patient Instructions   Plan:  I think you have some inflammation of the tendons or the small joints of the fingers  I'm going to change the medications a little bit and see if that helps  Gabapentin should help with the burning/sandpaper pain  Steroids to calm the inflammation from the inside   Gel can go directly on the fingers to help the pain and inflammation  I'm going to send you to a different specialist (Sports Medicine) who can hopefully take a closer look, possibly with an ultrasound and might do an injection that could help. We may have to send you back to the hand specialist.   We  will call you if the Xray shows anything concerning that changes our plan    IF you received an x-ray today, you will receive an invoice from [facility]. Please contact [facility] at [phone] with questions or concerns regarding your invoice.   IF you received labwork today, you will receive an invoice from [facility]. Please contact [facility] at [phone] with questions or concerns regarding your invoice.   Our billing staff will not be able to assist you with questions regarding bills from these companies.  You will be contacted with the lab results as soon as they are available. The fastest way to get your results is to activate your My Chart account. Instructions are located on the last page of this paperwork. If you have not heard from [place] regarding the results in 2 weeks, please contact this office.        Visit summary with medication list and  pertinent instructions was printed for patient to review. All questions at time of visit were answered - patient instructed to contact office with any additional concerns. ER/RTC precautions were reviewed with the patient. Follow-up plan: Return if symptoms worsen or fail to improve, and as directed by PCP for routine care of chronic conditions.

## 2017-05-20 NOTE — Patient Instructions (Addendum)
Plan:  I think you have some inflammation of the tendons or the small joints of the fingers  I'm going to change the medications a little bit and see if that helps  Gabapentin should help with the burning/sandpaper pain  Steroids to calm the inflammation from the inside   Gel can go directly on the fingers to help the pain and inflammation  I'm going to send you to a different specialist (Sports Medicine) who can hopefully take a closer look, possibly with an ultrasound and might do an injection that could help. We may have to send you back to the hand specialist.   We will call you if the Xray shows anything concerning that changes our plan    IF you received an x-ray today, you will receive an invoice from [facility]. Please contact [facility] at [phone] with questions or concerns regarding your invoice.   IF you received labwork today, you will receive an invoice from [facility]. Please contact [facility] at [phone] with questions or concerns regarding your invoice.   Our billing staff will not be able to assist you with questions regarding bills from these companies.  You will be contacted with the lab results as soon as they are available. The fastest way to get your results is to activate your My Chart account. Instructions are located on the last page of this paperwork. If you have not heard from [place] regarding the results in 2 weeks, please contact this office.

## 2017-05-22 ENCOUNTER — Encounter: Payer: Self-pay | Admitting: Family Medicine

## 2017-05-22 ENCOUNTER — Ambulatory Visit (INDEPENDENT_AMBULATORY_CARE_PROVIDER_SITE_OTHER): Payer: Medicare Other | Admitting: Family Medicine

## 2017-05-22 DIAGNOSIS — G5601 Carpal tunnel syndrome, right upper limb: Secondary | ICD-10-CM

## 2017-05-22 NOTE — Patient Instructions (Signed)
You have carpal tunnel syndrome. Restart wearing the wrist brace at nighttime and as often as possible during the day Finish the prednisone dose pack. You can continue with the gabapentin and topical medicine they gave you in urgent care too. Corticosteroid injection is a consideration to help with pain and inflammation If not improving, will consider nerve conduction studies to assess severity. Follow up with me in 1 month if you're doing well but call me sooner if you're struggling.

## 2017-05-22 NOTE — Progress Notes (Signed)
PCP: Glendale Chard, MD  Subjective:   HPI: Patient is a 71 y.o. female here for right hand pain.  Patient reports she started to get pain in right hand/wrist on 7/14. No acute injury or trauma. Pain radiating into right middle and ring fingers. Difficulty bending fingers fully and making a fist. Pain level up to 6/10 and sharp. She was seen in ED, then by hand specialist, and finally at Urgent care. Seemed to get benefit only from treatment provided at urgent care - topical voltaren, prednisone dose pack, and gabapentin. She has stopped using wrist brace noting her fingers are what hurt more but much better than they were - could barely touch these before UC visit. Radiographs of hand were negative for acute findings. Right handed.  Past Medical History:  Diagnosis Date  . Breast cancer ([place])   . Chronic kidney disease    CKD stage 3  . Chronic kidney disease, stage 3 (moderate) 05/20/2017  . Diabetes mellitus without complication ([place])   . Endometrial hyperplasia 05/20/2017  . 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 Hyperlipemia   . Hypertension   . Hypertensive chronic kidney disease with stage 1 through stage 4 chronic kidney disease, or unspecified chronic kidney disease 05/20/2017  . Numbness and tingling of both feet   . Type 2 diabetes mellitus with diabetic chronic kidney disease ([place]) 05/20/2017    Current Outpatient Prescriptions on File Prior to Visit  Medication Sig Dispense Refill  . aspirin EC 81 MG tablet Take 81 mg by mouth daily after breakfast.     . Calcium Carb-Cholecalciferol (CALCIUM + D3 PO) Take 1 tablet by mouth daily.     . Cholecalciferol (VITAMIN D) 2000 units tablet Take 2,000 Units by mouth daily.    . diclofenac sodium (VOLTAREN) 1 % GEL Apply 2 g topically 4 (four) times daily. To affected fingers 100 g 1  . gabapentin (NEURONTIN) 300 MG capsule Take 1-2 capsules (300-600 mg total) by mouth 3 (three) times daily as needed  (hand pain). Start at lowest dose 90 capsule 1  . hydrochlorothiazide (HYDRODIURIL) 25 MG tablet Take 25 mg by mouth daily after breakfast.     . HYDROcodone-acetaminophen (NORCO) 5-325 MG tablet Take 1 tablet by mouth every 6 (six) hours as needed (for pain). (Patient not taking: Reported on 05/20/2017) 20 tablet 0  . L-Methylfolate-B6-B12 (FOLTANX) 3-35-2 MG TABS Take 1 tablet by mouth daily.    Marland Kitchen losartan (COZAAR) 25 MG tablet Take 25 mg by mouth daily after breakfast.     . metFORMIN (GLUCOPHAGE) 500 MG tablet Take 500 mg by mouth daily after breakfast.     . methylPREDNISolone (MEDROL DOSEPAK) 4 MG TBPK tablet As directed 21 tablet 0  . Multiple Vitamins-Minerals (ONE-A-DAY WOMENS 50 PLUS PO) Take 1 tablet by mouth daily.    . simvastatin (ZOCOR) 20 MG tablet Take 20 mg by mouth daily after breakfast.      No current facility-administered medications on file prior to visit.     Past Surgical History:  Procedure Laterality Date  . BACK SURGERY    . BREAST LUMPECTOMY Left   . COLONOSCOPY    . DILATATION & CURETTAGE/HYSTEROSCOPY WITH MYOSURE N/A 04/07/2017   Procedure: DILATATION & CURETTAGE/HYSTEROSCOPY WITH MYOSURE;  Surgeon: Servando Salina, MD;  Location: [facility];  Service: Gynecology;  Laterality: N/A;  . KNEE SURGERY      Allergies  Allergen Reactions  . Percocet [Oxycodone-Acetaminophen] Nausea And Vomiting  . Shellfish Allergy Other (  See Comments)    Causes Gout    Social History   Social History  . Marital status: Married    Spouse name: N/A  . Number of children: N/A  . Years of education: N/A   Occupational History  . Not on file.   Social History Main Topics  . Smoking status: Current Every Day Smoker    Packs/day: 0.25    Years: 48.00    Types: Cigarettes  . Smokeless tobacco: Never Used  . Alcohol use No  . Drug use: No  . Sexual activity: Not on file   Other Topics Concern  . Not on file   Social History Narrative  . No narrative on file     Family History  Problem Relation Age of Onset  . Stroke Mother     BP (!) 144/85   Pulse (!) 103   Ht 5\' 6"  (1.676 m)   Wt 205 lb (93 kg)   BMI 33.09 kg/m   Review of Systems: See HPI above.     Objective:  Physical Exam:  Gen: NAD, comfortable in exam room  Right hand/wrist: No gross deformity, swelling, bruising, atrophy. Mild TTP over carpal tunnel, in 3rd and 4th digits. FROM wrist with pain on full flexion.  Lacks only a few degrees flexion of 3rd and 4th digits when making a fist. Collateral ligaments intact of all digits and 5/5 strength with flexion and extension at PIP and DIP joints. Negative tinels and phalens.   Negative finkelsteins.  NVI distally.  Left hand/wrist: FROM without pain.  MSK u/s right hand/wrist: Median nerve volume 0.19, increased.  Flexor tendons appear normal without tenosynovitis.   Assessment & Plan:  1. Right hand/wrist pain - history and ultrasound consistent with carpal tunnel syndrome but improving with prednisone.  Encouraged to restart wearing the wrist brace.  Finish prednisone, continue voltaren gel and gabapentin.  Consider injection, NCVs if not continuing to improve.  F/u in 1 month.

## 2017-05-23 DIAGNOSIS — G5601 Carpal tunnel syndrome, right upper limb: Secondary | ICD-10-CM | POA: Insufficient documentation

## 2017-05-23 NOTE — Assessment & Plan Note (Signed)
history and ultrasound consistent with carpal tunnel syndrome but improving with prednisone.  Encouraged to restart wearing the wrist brace.  Finish prednisone, continue voltaren gel and gabapentin.  Consider injection, NCVs if not continuing to improve.  F/u in 1 month.

## 2017-06-22 ENCOUNTER — Ambulatory Visit (INDEPENDENT_AMBULATORY_CARE_PROVIDER_SITE_OTHER): Payer: Medicare Other | Admitting: Family Medicine

## 2017-06-22 ENCOUNTER — Encounter: Payer: Self-pay | Admitting: Family Medicine

## 2017-06-22 DIAGNOSIS — G5601 Carpal tunnel syndrome, right upper limb: Secondary | ICD-10-CM | POA: Diagnosis present

## 2017-06-22 MED ORDER — PREDNISONE 10 MG PO TABS: ORAL_TABLET | ORAL | 0 refills | Status: DC

## 2017-06-22 NOTE — Patient Instructions (Signed)
Your carpal tunnel has improved from last visit. Take another 6 day course of prednisone. Follow up with me in 1 week. Continue wrist brace at nighttime and as often as possible during the day You can continue with the gabapentin and topical medicine they gave you in urgent care too.

## 2017-06-23 NOTE — Progress Notes (Signed)
PCP: Tammy Chard, MD  Subjective:   HPI: Patient is a 71 y.o. female here for right hand pain.  7/30: Patient reports she started to get pain in right hand/wrist on 7/14. No acute injury or trauma. Pain radiating into right middle and ring fingers. Difficulty bending fingers fully and making a fist. Pain level up to 6/10 and sharp. She was seen in ED, then by hand specialist, and finally at Urgent care. Seemed to get benefit only from treatment provided at urgent care - topical voltaren, prednisone dose pack, and gabapentin. She has stopped using wrist brace noting her fingers are what hurt more but much better than they were - could barely touch these before UC visit. Radiographs of hand were negative for acute findings. Right handed.  8/30: Patient reports she feels improved from last visit. Prednisone helped her. Is still using voltaren gel and taking gabapentin. Pain can still get up to 6/10 and sharp but only in middle finger of right hand now. Feels swollen. No new injuries. No skin changes.  Past Medical History:  Diagnosis Date  . Breast cancer ([place])   . Chronic kidney disease    CKD stage 3  . Chronic kidney disease, stage 3 (moderate) 05/20/2017  . Diabetes mellitus without complication ([place])   . Endometrial hyperplasia 05/20/2017  . 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 Hyperlipemia   . Hypertension   . Hypertensive chronic kidney disease with stage 1 through stage 4 chronic kidney disease, or unspecified chronic kidney disease 05/20/2017  . Numbness and tingling of both feet   . Type 2 diabetes mellitus with diabetic chronic kidney disease ([place]) 05/20/2017    Current Outpatient Prescriptions on File Prior to Visit  Medication Sig Dispense Refill  . aspirin EC 81 MG tablet Take 81 mg by mouth daily after breakfast.     . Calcium Carb-Cholecalciferol (CALCIUM + D3 PO) Take 1 tablet by mouth daily.     . Cholecalciferol (VITAMIN D)  2000 units tablet Take 2,000 Units by mouth daily.    . diclofenac sodium (VOLTAREN) 1 % GEL Apply 2 g topically 4 (four) times daily. To affected fingers 100 g 1  . gabapentin (NEURONTIN) 300 MG capsule Take 1-2 capsules (300-600 mg total) by mouth 3 (three) times daily as needed (hand pain). Start at lowest dose 90 capsule 1  . hydrochlorothiazide (HYDRODIURIL) 25 MG tablet Take 25 mg by mouth daily after breakfast.     . HYDROcodone-acetaminophen (NORCO) 5-325 MG tablet Take 1 tablet by mouth every 6 (six) hours as needed (for pain). (Patient not taking: Reported on 05/20/2017) 20 tablet 0  . L-Methylfolate-B6-B12 (FOLTANX) 3-35-2 MG TABS Take 1 tablet by mouth daily.    Marland Kitchen losartan (COZAAR) 25 MG tablet Take 25 mg by mouth daily after breakfast.     . metFORMIN (GLUCOPHAGE) 500 MG tablet Take 500 mg by mouth daily after breakfast.     . Multiple Vitamins-Minerals (ONE-A-DAY WOMENS 50 PLUS PO) Take 1 tablet by mouth daily.    . simvastatin (ZOCOR) 20 MG tablet Take 20 mg by mouth daily after breakfast.      No current facility-administered medications on file prior to visit.     Past Surgical History:  Procedure Laterality Date  . BACK SURGERY    . BREAST LUMPECTOMY Left   . COLONOSCOPY    . DILATATION & CURETTAGE/HYSTEROSCOPY WITH MYOSURE N/A 04/07/2017   Procedure: [place];  Surgeon: Servando Salina, MD;  Location: [facility];  Service: Gynecology;  Laterality: N/A;  . KNEE SURGERY      Allergies  Allergen Reactions  . Percocet [Oxycodone-Acetaminophen] Nausea And Vomiting  . Shellfish Allergy Other (See Comments)    Causes Gout    Social History   Social History  . Marital status: Married    Spouse name: N/A  . Number of children: N/A  . Years of education: N/A   Occupational History  . Not on file.   Social History Main Topics  . Smoking status: Current Every Day Smoker    Packs/day: 0.25    Years: 48.00    Types: Cigarettes   . Smokeless tobacco: Never Used  . Alcohol use No  . Drug use: No  . Sexual activity: Not on file   Other Topics Concern  . Not on file   Social History Narrative  . No narrative on file    Family History  Problem Relation Age of Onset  . Stroke Mother     BP 136/84   Pulse (!) 101   Ht 5\' 6"  (1.676 m)   Wt 209 lb (94.8 kg)   BMI 33.73 kg/m   Review of Systems: See HPI above.     Objective:  Physical Exam:  Gen: NAD, comfortable in exam room  Right hand/wrist: No gross deformity, swelling, bruising, atrophy.  No evidence felon, paronychia. TTP only volar aspect of 3rd digit middle and distal phalanx.  No TTP over carpal tunnel, elsewhere in wrist or hand. FROM wrist without pain.   Collateral ligaments intact of all digits and 5/5 strength with flexion and extension at PIP and DIP joints. Negative tinels. NVI distally.  Left hand/wrist: FROM without pain.  MSK u/s right 3rd digit:  No tenosynovitis or abnormalities of flexor tendons 3rd digit.  No evidence felon.     Assessment & Plan:  1. Right hand/wrist pain - history and ultrasound consistent with carpal tunnel syndrome initially - now only with pain in 3rd digit and rest of exam is reassuring.  Will go ahead with another 6 day course of prednisone.  Continue wearing wrist brace at nighttime and as often as possible during day (she was wearing a left sided brace on her right wrist).  Continue voltaren gel and gabapentin.  Consider injection, NCVs if not continuing to improve.  F/u in 1 week.

## 2017-06-23 NOTE — Assessment & Plan Note (Signed)
history and ultrasound consistent with carpal tunnel syndrome initially - now only with pain in 3rd digit and rest of exam is reassuring.  Will go ahead with another 6 day course of prednisone.  Continue wearing wrist brace at nighttime and as often as possible during day (she was wearing a left sided brace on her right wrist).  Continue voltaren gel and gabapentin.  Consider injection, NCVs if not continuing to improve.  F/u in 1 week.

## 2017-06-29 ENCOUNTER — Ambulatory Visit (INDEPENDENT_AMBULATORY_CARE_PROVIDER_SITE_OTHER): Payer: Medicare Other | Admitting: Family Medicine

## 2017-06-29 ENCOUNTER — Encounter: Payer: Self-pay | Admitting: Family Medicine

## 2017-06-29 VITALS — BP 158/93 | HR 90 | Ht 66.0 in | Wt 213.0 lb

## 2017-06-29 DIAGNOSIS — G5601 Carpal tunnel syndrome, right upper limb: Secondary | ICD-10-CM | POA: Diagnosis not present

## 2017-06-29 MED ORDER — METHYLPREDNISOLONE ACETATE 40 MG/ML IJ SUSP
40.0000 mg | Freq: Once | INTRAMUSCULAR | Status: AC
  Administered 2017-06-29: 40 mg via INTRA_ARTICULAR

## 2017-06-29 NOTE — Patient Instructions (Signed)
You were given a carpal tunnel injection today. Wear the brace as you have been. Continue the gabapentin. Call me in a week if you aren't improving. Follow up with me in 1 month.

## 2017-07-02 NOTE — Progress Notes (Signed)
PCP: Glendale Chard, MD  Subjective:   HPI: Patient is a 71 y.o. female here for right hand pain.  7/30: Patient reports she started to get pain in right hand/wrist on 7/14. No acute injury or trauma. Pain radiating into right middle and ring fingers. Difficulty bending fingers fully and making a fist. Pain level up to 6/10 and sharp. She was seen in ED, then by hand specialist, and finally at Urgent care. Seemed to get benefit only from treatment provided at urgent care - topical voltaren, prednisone dose pack, and gabapentin. She has stopped using wrist brace noting her fingers are what hurt more but much better than they were - could barely touch these before UC visit. Radiographs of hand were negative for acute findings. Right handed.  8/30: Patient reports she feels improved from last visit. Prednisone helped her. Is still using voltaren gel and taking gabapentin. Pain can still get up to 6/10 and sharp but only in middle finger of right hand now. Feels swollen. No new injuries. No skin changes.  9/6: Patient reports pain has improved but 3rd digit > 4th digits still sensitive to the touch into the wrist. Has been wearing brace. Finished prednisone dose pack which helped. Pain currently 0/10 but has tightness sensation into these fingers above. Taking gabapentin. No skin changes.  Past Medical History:  Diagnosis Date  . Breast cancer ([place])   . Chronic kidney disease    CKD stage 3  . Chronic kidney disease, stage 3 (moderate) 05/20/2017  . Diabetes mellitus without complication ([place])   . Endometrial hyperplasia 05/20/2017  . 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 Hyperlipemia   . Hypertension   . Hypertensive chronic kidney disease with stage 1 through stage 4 chronic kidney disease, or unspecified chronic kidney disease 05/20/2017  . Numbness and tingling of both feet   . Type 2 diabetes mellitus with diabetic chronic kidney disease  ([place]) 05/20/2017    Current Outpatient Prescriptions on File Prior to Visit  Medication Sig Dispense Refill  . aspirin EC 81 MG tablet Take 81 mg by mouth daily after breakfast.     . Calcium Carb-Cholecalciferol (CALCIUM + D3 PO) Take 1 tablet by mouth daily.     . Cholecalciferol (VITAMIN D) 2000 units tablet Take 2,000 Units by mouth daily.    . diclofenac sodium (VOLTAREN) 1 % GEL Apply 2 g topically 4 (four) times daily. To affected fingers 100 g 1  . gabapentin (NEURONTIN) 300 MG capsule Take 1-2 capsules (300-600 mg total) by mouth 3 (three) times daily as needed (hand pain). Start at lowest dose 90 capsule 1  . hydrochlorothiazide (HYDRODIURIL) 25 MG tablet Take 25 mg by mouth daily after breakfast.     . HYDROcodone-acetaminophen (NORCO) 5-325 MG tablet Take 1 tablet by mouth every 6 (six) hours as needed (for pain). (Patient not taking: Reported on 05/20/2017) 20 tablet 0  . L-Methylfolate-B6-B12 (FOLTANX) 3-35-2 MG TABS Take 1 tablet by mouth daily.    Marland Kitchen losartan (COZAAR) 25 MG tablet Take 25 mg by mouth daily after breakfast.     . metFORMIN (GLUCOPHAGE) 500 MG tablet Take 500 mg by mouth daily after breakfast.     . Multiple Vitamins-Minerals (ONE-A-DAY WOMENS 50 PLUS PO) Take 1 tablet by mouth daily.    . predniSONE (DELTASONE) 10 MG tablet 6 tabs po day 1, 5 tabs po day 2, 4 tabs po day 3, 3 tabs po day 4, 2 tabs po day 5, 1  tab po day 6 21 tablet 0  . simvastatin (ZOCOR) 20 MG tablet Take 20 mg by mouth daily after breakfast.      No current facility-administered medications on file prior to visit.     Past Surgical History:  Procedure Laterality Date  . BACK SURGERY    . BREAST LUMPECTOMY Left   . COLONOSCOPY    . DILATATION & CURETTAGE/HYSTEROSCOPY WITH MYOSURE N/A 04/07/2017   Procedure: DILATATION & CURETTAGE/HYSTEROSCOPY WITH MYOSURE;  Surgeon: Servando Salina, MD;  Location: [facility];  Service: Gynecology;  Laterality: N/A;  . KNEE SURGERY      Allergies   Allergen Reactions  . Percocet [Oxycodone-Acetaminophen] Nausea And Vomiting  . Shellfish Allergy Other (See Comments)    Causes Gout    Social History   Social History  . Marital status: Married    Spouse name: N/A  . Number of children: N/A  . Years of education: N/A   Occupational History  . Not on file.   Social History Main Topics  . Smoking status: Current Every Day Smoker    Packs/day: 0.25    Years: 48.00    Types: Cigarettes  . Smokeless tobacco: Never Used  . Alcohol use No  . Drug use: No  . Sexual activity: Not on file   Other Topics Concern  . Not on file   Social History Narrative  . No narrative on file    Family History  Problem Relation Age of Onset  . Stroke Mother     BP (!) 158/93   Pulse 90   Ht 5\' 6"  (1.676 m)   Wt 213 lb (96.6 kg)   BMI 34.38 kg/m   Review of Systems: See HPI above.     Objective:  Physical Exam:  Gen: NAD, comfortable in exam room  Right hand/wrist: No gross deformity, swelling, bruising, atrophy.  No evidence felon, paronychia. TTP volar aspect of 3rd digit middle and distal phalanx, less 4th digit distal phalanx.  Mild TTP carpal tunnel, less than that of digits. FROM wrist without pain.   Collateral ligaments intact of all digits and 5/5 strength with flexion and extension at PIP and DIP joints. Negative tinels. NVI distally.  Left hand/wrist: FROM without pain.  MSK u/s right 3rd digit:  No tenosynovitis or abnormalities of flexor tendons 3rd digit.  Carpal tunnel volume 0.18cm3    Assessment & Plan:  1. Right hand/wrist pain - history and ultrasound consistent with carpal tunnel syndrome.  Some improvement with prednisone dose pack and bracing but not enough relief.  Went ahead with injection today.  Continue brace, voltaren gel, and gabapentin.  F/u in 1 week.  After informed written consent, timeout was performed, and patient was seated in chair in exam room.  Right median nerve, ulnar artery  identified with ultrasound and marked between these.  Alcohol swab to prep area then injected right carpal tunnel with 2:1 bupivicaine: depomedrol.  Patient tolerated procedure well without immediate complications.

## 2017-07-02 NOTE — Assessment & Plan Note (Signed)
history and ultrasound consistent with carpal tunnel syndrome.  Some improvement with prednisone dose pack and bracing but not enough relief.  Went ahead with injection today.  Continue brace, voltaren gel, and gabapentin.  F/u in 1 week.  After informed written consent, timeout was performed, and patient was seated in chair in exam room.  Right median nerve, ulnar artery identified with ultrasound and marked between these.  Alcohol swab to prep area then injected right carpal tunnel with 2:1 bupivicaine: depomedrol.  Patient tolerated procedure well without immediate complications.

## 2017-07-10 DIAGNOSIS — N08 Glomerular disorders in diseases classified elsewhere: Secondary | ICD-10-CM | POA: Diagnosis not present

## 2017-07-10 DIAGNOSIS — I129 Hypertensive chronic kidney disease with stage 1 through stage 4 chronic kidney disease, or unspecified chronic kidney disease: Secondary | ICD-10-CM | POA: Diagnosis not present

## 2017-07-10 DIAGNOSIS — N183 Chronic kidney disease, stage 3 (moderate): Secondary | ICD-10-CM | POA: Diagnosis not present

## 2017-07-10 DIAGNOSIS — E1122 Type 2 diabetes mellitus with diabetic chronic kidney disease: Secondary | ICD-10-CM | POA: Diagnosis not present

## 2017-07-10 DIAGNOSIS — Z23 Encounter for immunization: Secondary | ICD-10-CM | POA: Diagnosis not present

## 2017-07-10 DIAGNOSIS — E559 Vitamin D deficiency, unspecified: Secondary | ICD-10-CM | POA: Diagnosis not present

## 2017-07-10 DIAGNOSIS — Z0000 Encounter for general adult medical examination without abnormal findings: Secondary | ICD-10-CM | POA: Diagnosis not present

## 2017-07-17 ENCOUNTER — Telehealth: Payer: Self-pay | Admitting: Family Medicine

## 2017-07-17 DIAGNOSIS — M659 Synovitis and tenosynovitis, unspecified: Secondary | ICD-10-CM

## 2017-07-17 MED ORDER — DICLOFENAC SODIUM 1 % TD GEL: 2.0000 g | Freq: Four times a day (QID) | TRANSDERMAL | 3 refills | Status: DC

## 2017-07-17 NOTE — Telephone Encounter (Signed)
Patient informed. 

## 2017-07-17 NOTE — Telephone Encounter (Signed)
Ok sent in - thanks!

## 2017-07-17 NOTE — Telephone Encounter (Signed)
Medication Refill: diclofenac sodium (VOLTAREN) 1 % GEL   Patient still uses the [facility] on [address]

## 2017-07-21 ENCOUNTER — Telehealth: Payer: Self-pay | Admitting: Family Medicine

## 2017-07-21 DIAGNOSIS — M659 Synovitis and tenosynovitis, unspecified: Secondary | ICD-10-CM

## 2017-07-21 MED ORDER — GABAPENTIN 300 MG PO CAPS
300.0000 mg | ORAL_CAPSULE | Freq: Three times a day (TID) | ORAL | 1 refills | Status: DC | PRN
Start: 2017-07-21 — End: 2018-09-17

## 2017-07-21 NOTE — Telephone Encounter (Signed)
Sent script in to her pharmacy.  Thanks!

## 2017-07-21 NOTE — Telephone Encounter (Signed)
Patient would like to know if she is to continue taking Gabapentin until her follow up appointment on 10/11.  States she only has two days worth of medication left.   Patient uses the [facility] on [address]

## 2017-07-21 NOTE — Telephone Encounter (Signed)
Patient informed. 

## 2017-07-28 DIAGNOSIS — R319 Hematuria, unspecified: Secondary | ICD-10-CM | POA: Diagnosis not present

## 2017-08-03 ENCOUNTER — Encounter: Payer: Self-pay | Admitting: Family Medicine

## 2017-08-03 ENCOUNTER — Ambulatory Visit (INDEPENDENT_AMBULATORY_CARE_PROVIDER_SITE_OTHER): Payer: Medicare Other | Admitting: Family Medicine

## 2017-08-03 DIAGNOSIS — G5601 Carpal tunnel syndrome, right upper limb: Secondary | ICD-10-CM | POA: Diagnosis present

## 2017-08-03 NOTE — Patient Instructions (Signed)
I think you've improved over the past few months. Consider capsaicin, aspercreme, biofreeze topically up to 4 times a day. Next step would be to do the nerve conduction studies - call me if you want [place] to order these for you. I would give this another 6-12 weeks to recover as nerves can take a while to heal.

## 2017-08-04 NOTE — Progress Notes (Signed)
PCP: Tammy Chard, MD  Subjective:   HPI: Patient is a 71 y.o. female here for right hand pain.  7/30: Patient reports she started to get pain in right hand/wrist on 7/14. No acute injury or trauma. Pain radiating into right middle and ring fingers. Difficulty bending fingers fully and making a fist. Pain level up to 6/10 and sharp. She was seen in ED, then by hand specialist, and finally at Urgent care. Seemed to get benefit only from treatment provided at urgent care - topical voltaren, prednisone dose pack, and gabapentin. She has stopped using wrist brace noting her fingers are what hurt more but much better than they were - could barely touch these before UC visit. Radiographs of hand were negative for acute findings. Right handed.  8/30: Patient reports she feels improved from last visit. Prednisone helped her. Is still using voltaren gel and taking gabapentin. Pain can still get up to 6/10 and sharp but only in middle finger of right hand now. Feels swollen. No new injuries. No skin changes.  9/6: Patient reports pain has improved but 3rd digit > 4th digits still sensitive to the touch into the wrist. Has been wearing brace. Finished prednisone dose pack which helped. Pain currently 0/10 but has tightness sensation into these fingers above. Taking gabapentin. No skin changes.  10/11: Patient reports she feels about the same. She doesn't feel the carpal tunnel injection helped. She's using topical medications as well. Pain currently 0/10. Feels it's difficult to hold things with this hand or for things to touch tip of her middle finger - feels tender. Ring finger feels improved however. No skin changes.  Still with some numbness. Takes gabapentin.  Past Medical History:  Diagnosis Date  . Breast cancer ([place])   . Chronic kidney disease    CKD stage 3  . Chronic kidney disease, stage 3 (moderate) ([place]) 05/20/2017  . Diabetes mellitus without complication ([place])    . Endometrial hyperplasia 05/20/2017  . 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 Hyperlipemia   . Hypertension   . Hypertensive chronic kidney disease with stage 1 through stage 4 chronic kidney disease, or unspecified chronic kidney disease 05/20/2017  . Numbness and tingling of both feet   . Type 2 diabetes mellitus with diabetic chronic kidney disease ([place]) 05/20/2017    Current Outpatient Prescriptions on File Prior to Visit  Medication Sig Dispense Refill  . aspirin EC 81 MG tablet Take 81 mg by mouth daily after breakfast.     . Calcium Carb-Cholecalciferol (CALCIUM + D3 PO) Take 1 tablet by mouth daily.     . Cholecalciferol (VITAMIN D) 2000 units tablet Take 2,000 Units by mouth daily.    . diclofenac sodium (VOLTAREN) 1 % GEL Apply 2 g topically 4 (four) times daily. To affected fingers 100 g 3  . gabapentin (NEURONTIN) 300 MG capsule Take 1-2 capsules (300-600 mg total) by mouth 3 (three) times daily as needed (hand pain). Start at lowest dose 90 capsule 1  . hydrochlorothiazide (HYDRODIURIL) 25 MG tablet Take 25 mg by mouth daily after breakfast.     . HYDROcodone-acetaminophen (NORCO) 5-325 MG tablet Take 1 tablet by mouth every 6 (six) hours as needed (for pain). (Patient not taking: Reported on 05/20/2017) 20 tablet 0  . L-Methylfolate-B6-B12 (FOLTANX) 3-35-2 MG TABS Take 1 tablet by mouth daily.    Marland Kitchen losartan (COZAAR) 25 MG tablet Take 25 mg by mouth daily after breakfast.     . metFORMIN (GLUCOPHAGE)  500 MG tablet Take 500 mg by mouth daily after breakfast.     . Multiple Vitamins-Minerals (ONE-A-DAY WOMENS 50 PLUS PO) Take 1 tablet by mouth daily.    . predniSONE (DELTASONE) 10 MG tablet 6 tabs po day 1, 5 tabs po day 2, 4 tabs po day 3, 3 tabs po day 4, 2 tabs po day 5, 1 tab po day 6 21 tablet 0  . simvastatin (ZOCOR) 20 MG tablet Take 20 mg by mouth daily after breakfast.      No current facility-administered medications on file prior to visit.      Past Surgical History:  Procedure Laterality Date  . BACK SURGERY    . BREAST LUMPECTOMY Left   . COLONOSCOPY    . DILATATION & CURETTAGE/HYSTEROSCOPY WITH MYOSURE N/A 04/07/2017   Procedure: DILATATION & CURETTAGE/HYSTEROSCOPY WITH MYOSURE;  Surgeon: Servando Salina, MD;  Location: [facility];  Service: Gynecology;  Laterality: N/A;  . KNEE SURGERY      Allergies  Allergen Reactions  . Percocet [Oxycodone-Acetaminophen] Nausea And Vomiting  . Shellfish Allergy Other (See Comments)    Causes Gout    Social History   Social History  . Marital status: Married    Spouse name: N/A  . Number of children: N/A  . Years of education: N/A   Occupational History  . Not on file.   Social History Main Topics  . Smoking status: Current Every Day Smoker    Packs/day: 0.25    Years: 48.00    Types: Cigarettes  . Smokeless tobacco: Never Used  . Alcohol use No  . Drug use: No  . Sexual activity: Not on file   Other Topics Concern  . Not on file   Social History Narrative  . No narrative on file    Family History  Problem Relation Age of Onset  . Stroke Mother     BP 139/85   Pulse 77   Ht 5\' 6"  (1.676 m)   Wt 213 lb (96.6 kg)   BMI 34.38 kg/m   Review of Systems: See HPI above.     Objective:  Physical Exam:  Gen: NAD, comfortable in exam room.  Right hand/wrist: No gross deformity, swelling, bruising, atrophy. TTP mildly volar aspect 3rd digit over distal phalanx only now.  No other tenderness including 4th digit.  No TTP carpal tunnel. Collateral ligaments intact of 3rd digit.  Strength 5/5 with flexion and extension at MCP, PIP, DIP joints. Negative tinels. NVI distally currently.  Left hand/wrist: FROM without pain.   Assessment & Plan:  1. Right hand/wrist pain - 2/2 carpal tunnel syndrome.  She reports lack of improvement but exam suggests improvement with carpal tunnel injection, bracing, s/p prednisone.  Continue voltaren gel, gabapentin.  Can  try capsaicin, biofreeze, or aspercreme instead.  She will consider NCV/EMGs as next step.  Otherwise give this another 6-12 weeks to see if she gets continued improvement.

## 2017-08-04 NOTE — Assessment & Plan Note (Signed)
2/2 carpal tunnel syndrome.  She reports lack of improvement but exam suggests improvement with carpal tunnel injection, bracing, s/p prednisone.  Continue voltaren gel, gabapentin.  Can try capsaicin, biofreeze, or aspercreme instead.  She will consider NCV/EMGs as next step.  Otherwise give this another 6-12 weeks to see if she gets continued improvement.

## 2017-08-22 DIAGNOSIS — E119 Type 2 diabetes mellitus without complications: Secondary | ICD-10-CM | POA: Diagnosis not present

## 2017-08-22 DIAGNOSIS — B5801 Toxoplasma chorioretinitis: Secondary | ICD-10-CM | POA: Diagnosis not present

## 2017-10-11 DIAGNOSIS — R3121 Asymptomatic microscopic hematuria: Secondary | ICD-10-CM | POA: Diagnosis not present

## 2017-11-13 DIAGNOSIS — N183 Chronic kidney disease, stage 3 (moderate): Secondary | ICD-10-CM | POA: Diagnosis not present

## 2017-11-13 DIAGNOSIS — M79672 Pain in left foot: Secondary | ICD-10-CM | POA: Diagnosis not present

## 2017-11-13 DIAGNOSIS — I129 Hypertensive chronic kidney disease with stage 1 through stage 4 chronic kidney disease, or unspecified chronic kidney disease: Secondary | ICD-10-CM | POA: Diagnosis not present

## 2017-11-13 DIAGNOSIS — E1122 Type 2 diabetes mellitus with diabetic chronic kidney disease: Secondary | ICD-10-CM | POA: Diagnosis not present

## 2017-12-20 DIAGNOSIS — M659 Synovitis and tenosynovitis, unspecified: Secondary | ICD-10-CM | POA: Diagnosis not present

## 2017-12-20 DIAGNOSIS — M19072 Primary osteoarthritis, left ankle and foot: Secondary | ICD-10-CM | POA: Diagnosis not present

## 2017-12-20 DIAGNOSIS — M109 Gout, unspecified: Secondary | ICD-10-CM | POA: Diagnosis not present

## 2017-12-20 DIAGNOSIS — M7752 Other enthesopathy of left foot: Secondary | ICD-10-CM | POA: Diagnosis not present

## 2017-12-25 DIAGNOSIS — M7751 Other enthesopathy of right foot: Secondary | ICD-10-CM | POA: Diagnosis not present

## 2017-12-25 DIAGNOSIS — M659 Synovitis and tenosynovitis, unspecified: Secondary | ICD-10-CM | POA: Diagnosis not present

## 2017-12-25 DIAGNOSIS — M21621 Bunionette of right foot: Secondary | ICD-10-CM | POA: Diagnosis not present

## 2017-12-25 DIAGNOSIS — M7752 Other enthesopathy of left foot: Secondary | ICD-10-CM | POA: Diagnosis not present

## 2018-01-29 DIAGNOSIS — Z853 Personal history of malignant neoplasm of breast: Secondary | ICD-10-CM | POA: Diagnosis not present

## 2018-01-29 DIAGNOSIS — Z1231 Encounter for screening mammogram for malignant neoplasm of breast: Secondary | ICD-10-CM | POA: Diagnosis not present

## 2018-01-29 DIAGNOSIS — Z780 Asymptomatic menopausal state: Secondary | ICD-10-CM | POA: Diagnosis not present

## 2018-03-14 DIAGNOSIS — E790 Hyperuricemia without signs of inflammatory arthritis and tophaceous disease: Secondary | ICD-10-CM | POA: Diagnosis not present

## 2018-03-14 DIAGNOSIS — E1122 Type 2 diabetes mellitus with diabetic chronic kidney disease: Secondary | ICD-10-CM | POA: Diagnosis not present

## 2018-03-14 DIAGNOSIS — N08 Glomerular disorders in diseases classified elsewhere: Secondary | ICD-10-CM | POA: Diagnosis not present

## 2018-03-14 DIAGNOSIS — I129 Hypertensive chronic kidney disease with stage 1 through stage 4 chronic kidney disease, or unspecified chronic kidney disease: Secondary | ICD-10-CM | POA: Diagnosis not present

## 2018-03-14 DIAGNOSIS — Z1389 Encounter for screening for other disorder: Secondary | ICD-10-CM | POA: Diagnosis not present

## 2018-03-14 DIAGNOSIS — N183 Chronic kidney disease, stage 3 (moderate): Secondary | ICD-10-CM | POA: Diagnosis not present

## 2018-03-22 DIAGNOSIS — Z86010 Personal history of colonic polyps: Secondary | ICD-10-CM | POA: Diagnosis not present

## 2018-03-22 DIAGNOSIS — K5730 Diverticulosis of large intestine without perforation or abscess without bleeding: Secondary | ICD-10-CM | POA: Diagnosis not present

## 2018-03-22 DIAGNOSIS — Z1211 Encounter for screening for malignant neoplasm of colon: Secondary | ICD-10-CM | POA: Diagnosis not present

## 2018-03-26 DIAGNOSIS — N183 Chronic kidney disease, stage 3 (moderate): Secondary | ICD-10-CM | POA: Diagnosis not present

## 2018-03-26 DIAGNOSIS — Z79899 Other long term (current) drug therapy: Secondary | ICD-10-CM | POA: Diagnosis not present

## 2018-04-01 IMAGING — US US PELVIS COMPLETE
1 series · 14 of 25 positions shown · non-contrast
Comparison: None

CLINICAL DATA: Possible uterine fibroids.



[Series 1: us pelvis complete · 0.28mm/px · 14 of 35 slices shown]
[im 1/35]
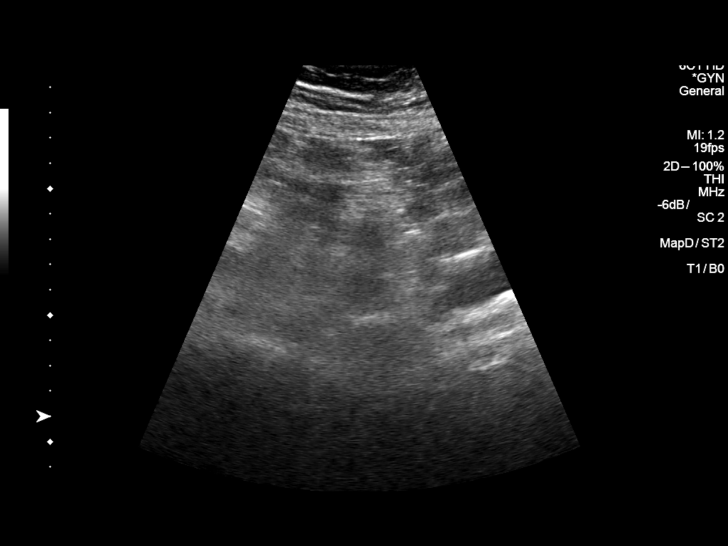
[im 3/35]
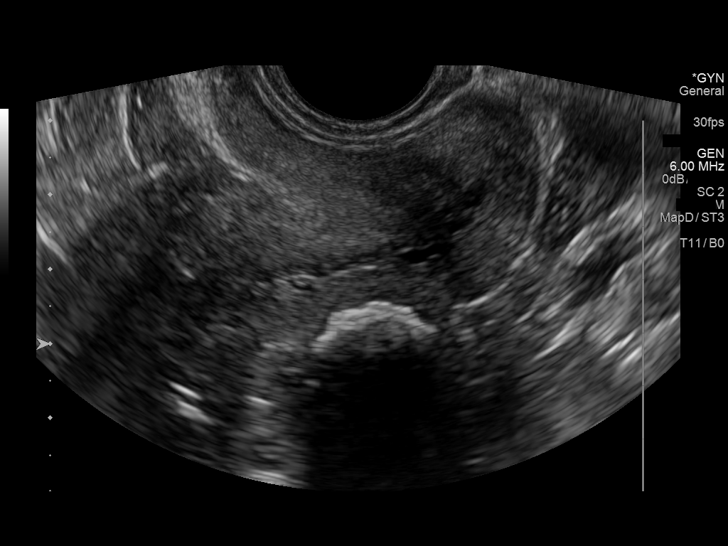
[im 6/35]
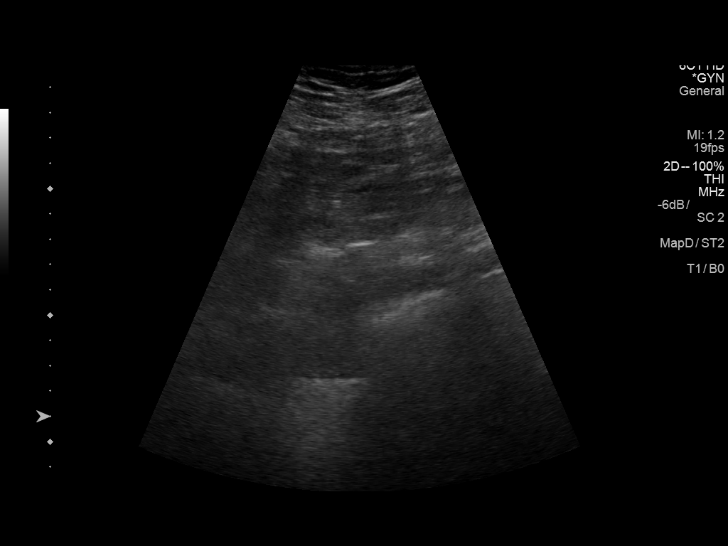
[im 9/35]
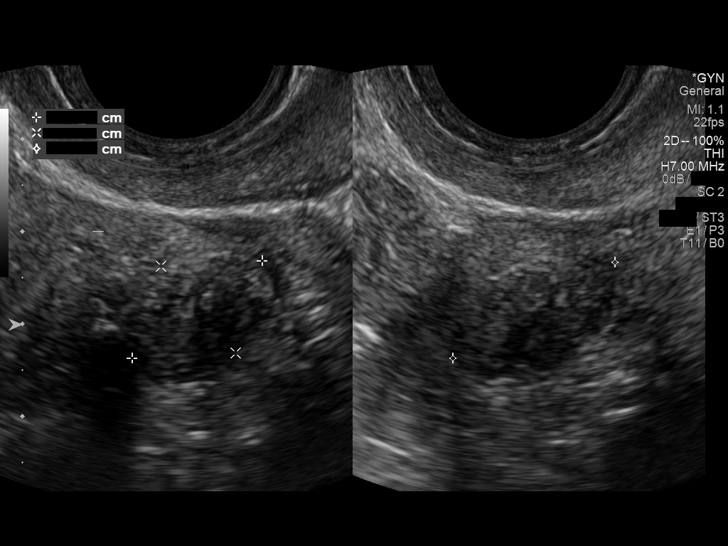
[im 12/35]
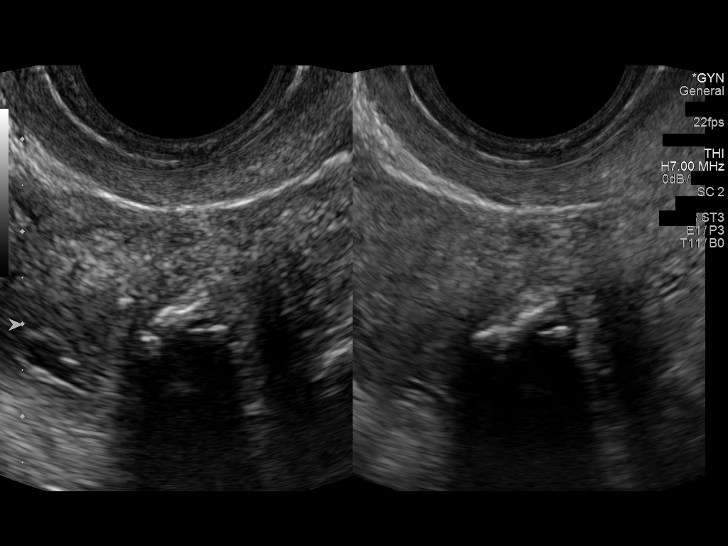
[im 13/35]
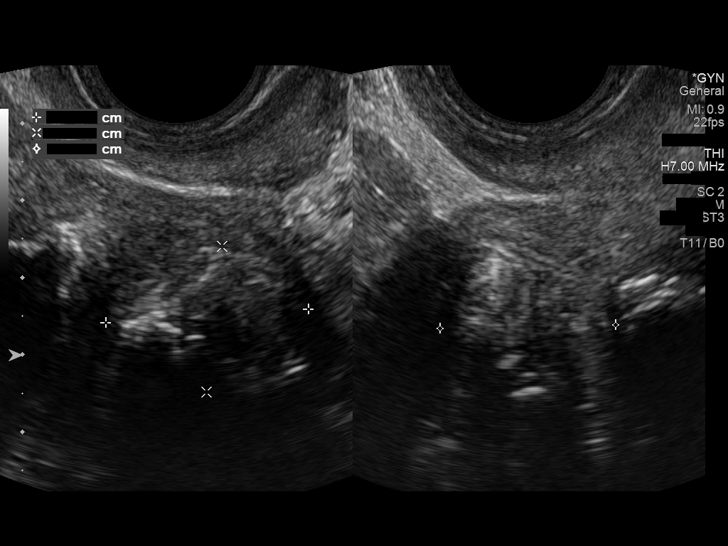
[im 16/35]
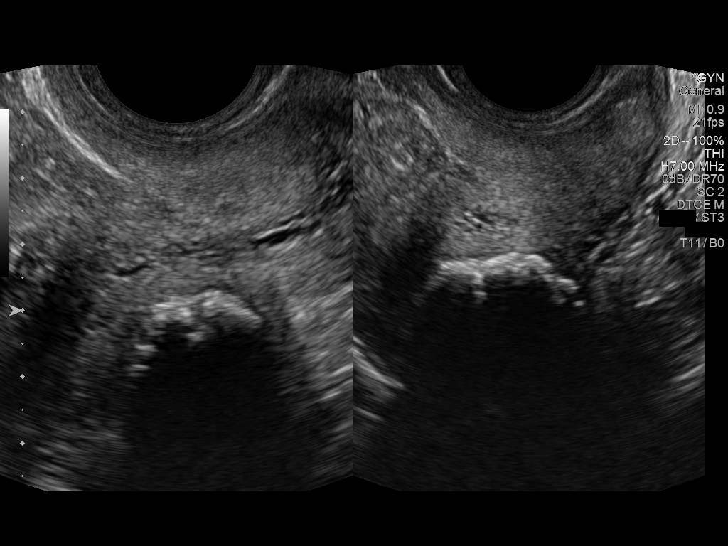
[im 19/35]
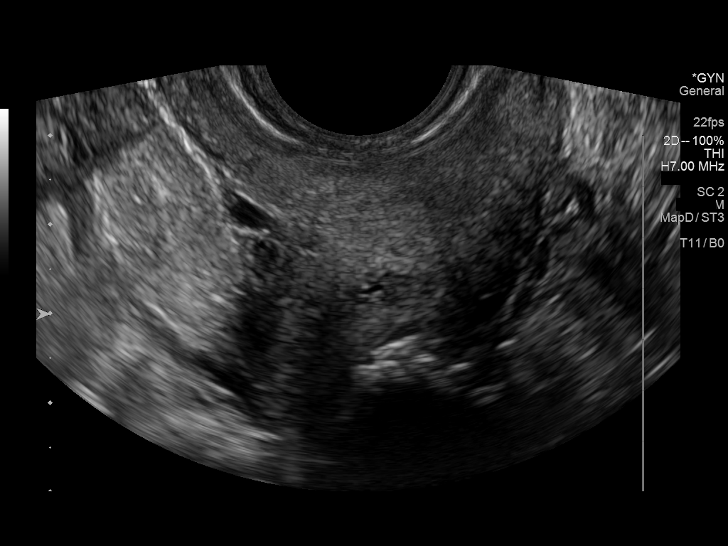
[im 22/35]
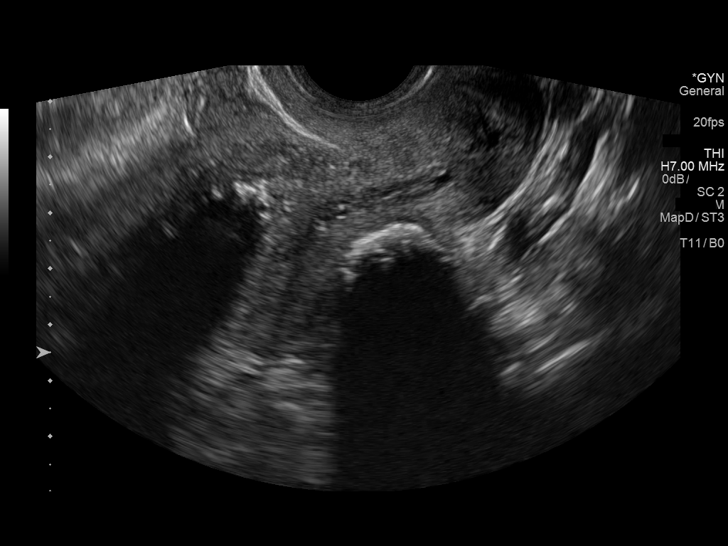
[im 23/35]
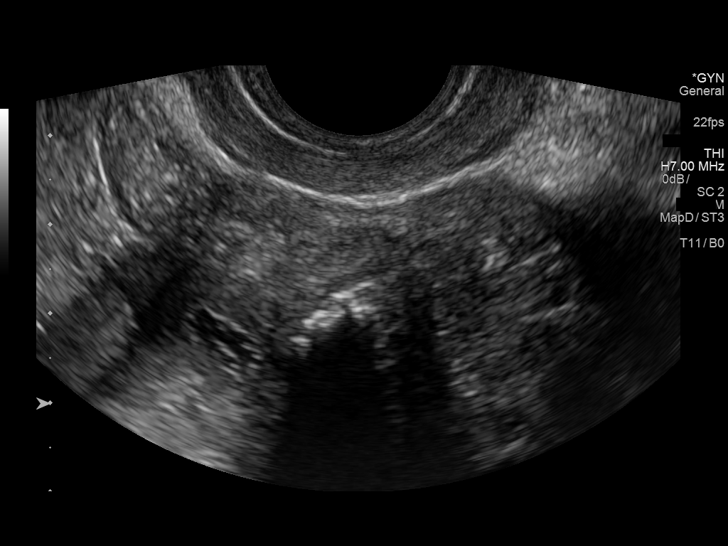
[im 26/35]
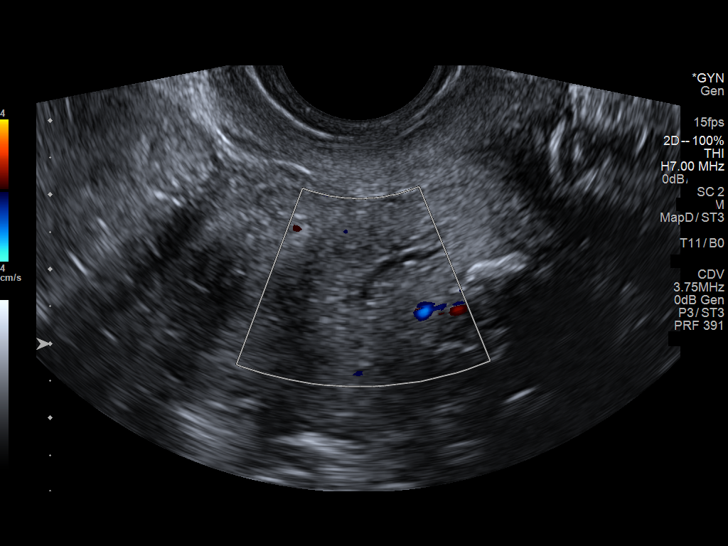
[im 29/35]
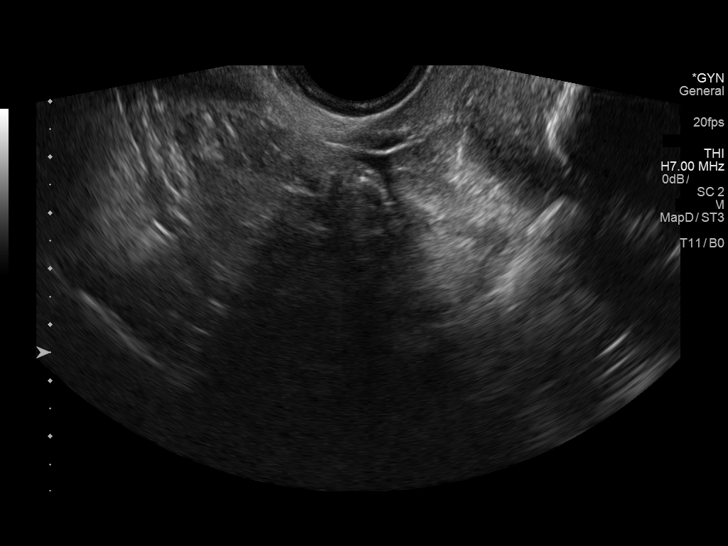
[im 32/35]
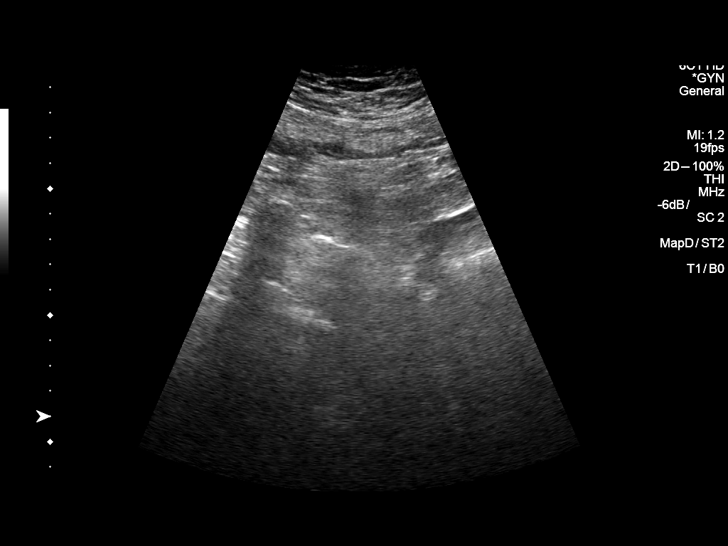
[im 35/35]
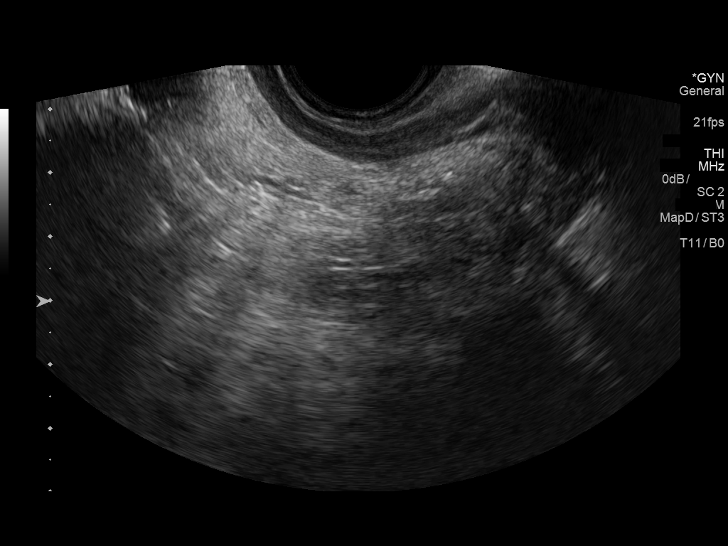

[14 of 25 positions shown; findings below may reference images not displayed]

FINDINGS: Uterus

Measurements: 7.7 x 4.7 x 4.3 cm. Multiple uterine fibroids are
noted, some of which are calcified. The largest is calcified fibroid
measuring 2.8 x 2.0 x 2.0 cm in the body of the uterus.

Endometrium

Thickness: 9.3 mm which is abnormally thickened for postmenopausal
patient. Complex fluid is present as well.

Right ovary

Not visualized.

Left ovary

Not visualized.

Other findings

No abnormal free fluid.
IMPRESSION: Multiple uterine fibroids are noted, some of which are calcified.
The largest measures 2.8 cm.

Endometrial thickness is considered abnormal for an asymptomatic
post-menopausal female. Endometrial sampling should be considered to
exclude carcinoma.

## 2018-05-02 DIAGNOSIS — Z86010 Personal history of colonic polyps: Secondary | ICD-10-CM | POA: Diagnosis not present

## 2018-05-02 DIAGNOSIS — D122 Benign neoplasm of ascending colon: Secondary | ICD-10-CM | POA: Diagnosis not present

## 2018-05-02 DIAGNOSIS — K635 Polyp of colon: Secondary | ICD-10-CM | POA: Diagnosis not present

## 2018-05-02 DIAGNOSIS — D124 Benign neoplasm of descending colon: Secondary | ICD-10-CM | POA: Diagnosis not present

## 2018-05-02 DIAGNOSIS — Z1211 Encounter for screening for malignant neoplasm of colon: Secondary | ICD-10-CM | POA: Diagnosis not present

## 2018-05-02 DIAGNOSIS — D123 Benign neoplasm of transverse colon: Secondary | ICD-10-CM | POA: Diagnosis not present

## 2018-07-12 DIAGNOSIS — E119 Type 2 diabetes mellitus without complications: Secondary | ICD-10-CM | POA: Diagnosis not present

## 2018-07-12 DIAGNOSIS — Z720 Tobacco use: Secondary | ICD-10-CM | POA: Diagnosis not present

## 2018-08-22 ENCOUNTER — Other Ambulatory Visit: Payer: Self-pay | Admitting: Internal Medicine

## 2018-08-23 ENCOUNTER — Ambulatory Visit (INDEPENDENT_AMBULATORY_CARE_PROVIDER_SITE_OTHER): Payer: Medicare Other | Admitting: Internal Medicine

## 2018-08-23 ENCOUNTER — Encounter: Payer: Self-pay | Admitting: Internal Medicine

## 2018-08-23 VITALS — BP 116/70 | HR 86 | Temp 97.7°F | Ht 66.0 in | Wt 210.4 lb

## 2018-08-23 DIAGNOSIS — N183 Chronic kidney disease, stage 3 unspecified: Secondary | ICD-10-CM

## 2018-08-23 DIAGNOSIS — I129 Hypertensive chronic kidney disease with stage 1 through stage 4 chronic kidney disease, or unspecified chronic kidney disease: Secondary | ICD-10-CM

## 2018-08-23 DIAGNOSIS — Z79899 Other long term (current) drug therapy: Secondary | ICD-10-CM | POA: Diagnosis not present

## 2018-08-23 DIAGNOSIS — F17210 Nicotine dependence, cigarettes, uncomplicated: Secondary | ICD-10-CM

## 2018-08-23 DIAGNOSIS — M1A3720 Chronic gout due to renal impairment, left ankle and foot, without tophus (tophi): Secondary | ICD-10-CM | POA: Diagnosis not present

## 2018-08-23 DIAGNOSIS — E1122 Type 2 diabetes mellitus with diabetic chronic kidney disease: Secondary | ICD-10-CM

## 2018-08-23 NOTE — Patient Instructions (Signed)

## 2018-08-23 NOTE — Progress Notes (Signed)
Subjective:     Patient ID: Tammy Preston , female    DOB: [birth year] , 72 y.o.   MRN: [id number]   Chief Complaint  Patient presents with  . Diabetes  . Hypertension    HPI  Diabetes  She presents for her follow-up diabetic visit. She has type 2 diabetes mellitus. Her disease course has been stable. Pertinent negatives for diabetes include no chest pain, no fatigue, no foot paresthesias, no polydipsia, no polyphagia and no polyuria. There are no hypoglycemic complications. There are no diabetic complications. Risk factors for coronary artery disease include diabetes mellitus, dyslipidemia, hypertension, post-menopausal and sedentary lifestyle. She is compliant with treatment all of the time. She is following a diabetic diet. Her breakfast blood glucose is taken between 8-9 am. Her breakfast blood glucose range is generally 110-130 mg/dl.  Hypertension  This is a chronic problem. The current episode started more than 1 year ago. The problem is controlled. Pertinent negatives include no chest pain, neck pain, orthopnea or palpitations. Risk factors for coronary artery disease include diabetes mellitus.     Past Medical History:  Diagnosis Date  . Breast cancer ([place])   . Chronic kidney disease    CKD stage 3  . Chronic kidney disease, stage 3 (moderate) ([place]) 05/20/2017  . Diabetes mellitus without complication ([place])   . Endometrial hyperplasia 05/20/2017  . 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 Hyperlipemia   . Hypertension   . Hypertensive chronic kidney disease with stage 1 through stage 4 chronic kidney disease, or unspecified chronic kidney disease 05/20/2017  . Numbness and tingling of both feet   . Type 2 diabetes mellitus with diabetic chronic kidney disease ([place]) 05/20/2017     Family History  Problem Relation Age of Onset  . Stroke Mother      Current Outpatient Medications:  .  aspirin EC 81 MG tablet, Take 81 mg by mouth daily after  breakfast. , Disp: , Rfl:  .  Calcium Carb-Cholecalciferol (CALCIUM + D3 PO), Take 1 tablet by mouth daily. , Disp: , Rfl:  .  Cholecalciferol (VITAMIN D) 2000 units tablet, Take 2,000 Units by mouth daily., Disp: , Rfl:  .  diclofenac sodium (VOLTAREN) 1 % GEL, Apply 2 g topically 4 (four) times daily. To affected fingers, Disp: 100 g, Rfl: 3 .  gabapentin (NEURONTIN) 300 MG capsule, Take 1-2 capsules (300-600 mg total) by mouth 3 (three) times daily as needed (hand pain). Start at lowest dose, Disp: 90 capsule, Rfl: 1 .  hydrochlorothiazide (HYDRODIURIL) 25 MG tablet, Take 25 mg by mouth daily after breakfast. , Disp: , Rfl:  .  HYDROcodone-acetaminophen (NORCO) 5-325 MG tablet, Take 1 tablet by mouth every 6 (six) hours as needed (for pain). (Patient not taking: Reported on 05/20/2017), Disp: 20 tablet, Rfl: 0 .  losartan (COZAAR) 25 MG tablet, TAKE 1 TABLET DAILY, Disp: 90 tablet, Rfl: 4 .  metFORMIN (GLUCOPHAGE) 500 MG tablet, Take 500 mg by mouth daily after breakfast. , Disp: , Rfl:  .  Multiple Vitamins-Minerals (ONE-A-DAY WOMENS 50 PLUS PO), Take 1 tablet by mouth daily., Disp: , Rfl:  .  predniSONE (DELTASONE) 10 MG tablet, 6 tabs po day 1, 5 tabs po day 2, 4 tabs po day 3, 3 tabs po day 4, 2 tabs po day 5, 1 tab po day 6, Disp: 21 tablet, Rfl: 0 .  simvastatin (ZOCOR) 20 MG tablet, Take 20 mg by mouth daily after breakfast. , Disp: , Rfl:  Allergies  Allergen Reactions  . Percocet [Oxycodone-Acetaminophen] Nausea And Vomiting  . Shellfish Allergy Other (See Comments)    Causes Gout     Review of Systems  Constitutional: Negative.  Negative for fatigue.  Respiratory: Negative.   Cardiovascular: Negative.  Negative for chest pain, palpitations and orthopnea.  Gastrointestinal: Negative.   Endocrine: Negative for polydipsia, polyphagia and polyuria.  Musculoskeletal: Negative for neck pain.  Psychiatric/Behavioral: Negative.      Today's Vitals   08/23/18 1021  BP: 116/70   Pulse: 86  Temp: 97.7 F (36.5 C)  TempSrc: Oral  Weight: 210 lb 6.4 oz (95.4 kg)  Height: 5' 6"  (1.676 m)  PainSc: 2   PainLoc: Toe   Body mass index is 33.96 kg/m.   Objective:  Physical Exam  Constitutional: She is oriented to person, place, and time. She appears well-developed and well-nourished.  HENT:  Head: Atraumatic.  Eyes: EOM are normal.  Cardiovascular: Normal rate, regular rhythm and normal heart sounds.  Pulmonary/Chest: Effort normal and breath sounds normal.  Neurological: She is alert and oriented to person, place, and time.  Psychiatric: She has a normal mood and affect.  Nursing note and vitals reviewed.       Assessment And Plan:     1. Diabetes mellitus with stage 3 chronic kidney disease ([place])  I will check labs as listed below. She is encouraged to avoid sugary beverages. Importance of regular exercise was discussed with the patient.    - Lipid Profile - Hemoglobin A1c - CMP14+EGFR  2. Chronic renal disease, stage III (H[facility])  Chronic. She is encouraged to stay well hydrated.  3. Parenchymal renal hypertension, stage 1 through stage 4 or unspecified chronic kidney disease  Well controlled. She will continue with current meds. She is encouraged to avoid adding salt to her foods.   4. 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 She has been followed by podiatry in the past. She is not currently taking any medications for this.   - Uric acid  5. Cigarette nicotine dependence without complication  We discussed importance of smoking cessation. She does not wish to quit at this time.   6. Drug therapy      [name], MD

## 2018-08-24 LAB — CMP14+EGFR
ALK PHOS: 81 IU/L (ref 39–117)
ALT: 22 IU/L (ref 0–32)
AST: 22 IU/L (ref 0–40)
Albumin/Globulin Ratio: 1.4 (ref 1.2–2.2)
Albumin: 4.3 g/dL (ref 3.5–4.8)
BUN/Creatinine Ratio: 16 (ref 12–28)
BUN: 18 mg/dL (ref 8–27)
Bilirubin Total: 0.4 mg/dL (ref 0.0–1.2)
CO2: 25 mmol/L (ref 20–29)
Calcium: 10.1 mg/dL (ref 8.7–10.3)
Chloride: 96 mmol/L (ref 96–106)
Creatinine, Ser: 1.16 mg/dL — ABNORMAL HIGH (ref 0.57–1.00)
GFR calc Af Amer: 55 mL/min/{1.73_m2} — ABNORMAL LOW (ref >59)
GFR, EST NON AFRICAN AMERICAN: 47 mL/min/{1.73_m2} — AB (ref >59)
Globulin, Total: 3 g/dL (ref 1.5–4.5)
Glucose: 104 mg/dL — ABNORMAL HIGH (ref 65–99)
POTASSIUM: 3.6 mmol/L (ref 3.5–5.2)
Sodium: 138 mmol/L (ref 134–144)
Total Protein: 7.3 g/dL (ref 6.0–8.5)

## 2018-08-24 LAB — URIC ACID: URIC ACID: 8 mg/dL — AB (ref 2.5–7.1)

## 2018-08-24 LAB — HEMOGLOBIN A1C
ESTIMATED AVERAGE GLUCOSE: 151 mg/dL
HEMOGLOBIN A1C: 6.9 % — AB (ref 4.8–5.6)

## 2018-08-24 LAB — LIPID PANEL
CHOL/HDL RATIO: 4.3 ratio (ref 0.0–4.4)
Cholesterol, Total: 138 mg/dL (ref 100–199)
HDL: 32 mg/dL — ABNORMAL LOW (ref >39)
LDL Calculated: 82 mg/dL (ref 0–99)
Triglycerides: 119 mg/dL (ref 0–149)
VLDL Cholesterol Cal: 24 mg/dL (ref 5–40)

## 2018-08-24 NOTE — Progress Notes (Signed)
Your chol is fairly good. Your LDL, bad chol is 82. Ideally, this should be less than 70.  Your hba1c is 6.9, this is great. Your liver and kidney fxn are stable. Be sure to stay well hydrated. Your uric acid level is elevated. Are you willing to start a gout medication? If yes, send rx allopurinol 100mg  once daily. She needs f/u in six weeks dx: gout.

## 2018-08-25 ENCOUNTER — Telehealth: Payer: Self-pay

## 2018-08-25 NOTE — Telephone Encounter (Signed)
Left a detailed message for the pt to call back.

## 2018-08-25 NOTE — Telephone Encounter (Signed)
-----   Message from Glendale Chard, MD sent at 08/24/2018  8:52 PM EDT ----- Your chol is fairly good. Your LDL, bad chol is 82. Ideally, this should be less than 70.  Your hba1c is 6.9, this is great. Your liver and kidney fxn are stable. Be sure to stay well hydrated. Your uric acid level is elevated. Are you willing to start a gout medication? If yes, send rx allopurinol 100mg  once daily. She needs f/u in six weeks dx: gout.

## 2018-08-27 ENCOUNTER — Other Ambulatory Visit: Payer: Self-pay

## 2018-08-27 MED ORDER — ALLOPURINOL 100 MG PO TABS: 100.0000 mg | ORAL_TABLET | Freq: Every day | ORAL | 1 refills | Status: DC

## 2018-08-28 DIAGNOSIS — B5801 Toxoplasma chorioretinitis: Secondary | ICD-10-CM | POA: Diagnosis not present

## 2018-08-28 DIAGNOSIS — H16223 Keratoconjunctivitis sicca, not specified as Sjogren's, bilateral: Secondary | ICD-10-CM | POA: Diagnosis not present

## 2018-08-28 DIAGNOSIS — H43813 Vitreous degeneration, bilateral: Secondary | ICD-10-CM | POA: Diagnosis not present

## 2018-08-28 DIAGNOSIS — E119 Type 2 diabetes mellitus without complications: Secondary | ICD-10-CM | POA: Diagnosis not present

## 2018-09-11 ENCOUNTER — Other Ambulatory Visit: Payer: Self-pay | Admitting: Internal Medicine

## 2018-09-11 ENCOUNTER — Other Ambulatory Visit: Payer: Self-pay

## 2018-09-14 ENCOUNTER — Other Ambulatory Visit: Payer: Self-pay | Admitting: Internal Medicine

## 2018-09-17 ENCOUNTER — Ambulatory Visit (INDEPENDENT_AMBULATORY_CARE_PROVIDER_SITE_OTHER): Payer: Medicare Other | Admitting: Internal Medicine

## 2018-09-17 ENCOUNTER — Encounter: Payer: Self-pay | Admitting: Internal Medicine

## 2018-09-17 VITALS — BP 114/68 | HR 79 | Temp 97.6°F | Ht 66.0 in | Wt 211.6 lb

## 2018-09-17 DIAGNOSIS — M1A3790 Chronic gout due to renal impairment, unspecified ankle and foot, without tophus (tophi): Secondary | ICD-10-CM

## 2018-09-17 DIAGNOSIS — Z79899 Other long term (current) drug therapy: Secondary | ICD-10-CM

## 2018-09-17 NOTE — Progress Notes (Signed)
Subjective:     Patient ID: Tammy Preston , female    DOB: January 29, 1946 , 72 y.o.   MRN: [id number]   Chief Complaint  Patient presents with  . Allopurinal f/u    HPI  She is here today for f/u gout. She was started on allopurinol 172m daily at her last visit. She has tolerated the medication without any issues.  She has noticed an improvement in her toe pain since starting the medication.     Past Medical History:  Diagnosis Date  . Breast cancer ([place]   . Chronic kidney disease    CKD stage 3  . Chronic kidney disease, stage 3 (moderate) ([place] 05/20/2017  . Diabetes mellitus without complication ([place]   . Endometrial hyperplasia 05/20/2017  . 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 Hyperlipemia   . Hypertension   . Hypertensive chronic kidney disease with stage 1 through stage 4 chronic kidney disease, or unspecified chronic kidney disease 05/20/2017  . Numbness and tingling of both feet   . Type 2 diabetes mellitus with diabetic chronic kidney disease ([place] 05/20/2017     Family History  Problem Relation Age of Onset  . Stroke Mother      Current Outpatient Medications:  .  allopurinol (ZYLOPRIM) 100 MG tablet, Take 1 tablet (100 mg total) by mouth daily., Disp: 100 tablet, Rfl: 1 .  aspirin EC 81 MG tablet, Take 81 mg by mouth daily after breakfast. , Disp: , Rfl:  .  Calcium Carb-Cholecalciferol (CALCIUM + D3 PO), Take 1 tablet by mouth daily. , Disp: , Rfl:  .  Cholecalciferol (VITAMIN D) 2000 units tablet, Take 2,000 Units by mouth daily., Disp: , Rfl:  .  hydrochlorothiazide (HYDRODIURIL) 25 MG tablet, Take 25 mg by mouth daily after breakfast. , Disp: , Rfl:  .  L-Methylfolate-B6-B12 (FOLTANX) 3-35-2 MG TABS, TAKE 1 TABLET BY MOUTH ONCE DAILY, Disp: 90 tablet, Rfl: 1 .  losartan (COZAAR) 25 MG tablet, TAKE 1 TABLET DAILY, Disp: 90 tablet, Rfl: 4 .  metFORMIN (GLUCOPHAGE) 500 MG tablet, Take 500 mg by mouth daily after breakfast. , Disp: ,  Rfl:  .  Multiple Vitamins-Minerals (ONE-A-DAY WOMENS 50 PLUS PO), Take 1 tablet by mouth daily., Disp: , Rfl:  .  simvastatin (ZOCOR) 20 MG tablet, TAKE 1 TABLET DAILY, Disp: 90 tablet, Rfl: 1   Allergies  Allergen Reactions  . Percocet [Oxycodone-Acetaminophen] Nausea And Vomiting  . Shellfish Allergy Other (See Comments)    Causes Gout     Review of Systems  Constitutional: Negative.   Respiratory: Negative.   Cardiovascular: Negative.   Gastrointestinal: Negative.   Neurological: Negative.   Psychiatric/Behavioral: Negative.      Today's Vitals   09/17/18 1036  BP: 114/68  Pulse: 79  Temp: 97.6 F (36.4 C)  TempSrc: Oral  Weight: 211 lb 9.6 oz (96 kg)  Height: 5' 6" (1.676 m)  PainSc: 0-No pain   Body mass index is 34.15 kg/m.   Objective:  Physical Exam  Constitutional: She appears well-developed and well-nourished.  HENT:  Head: Normocephalic and atraumatic.  Eyes: EOM are normal.  Cardiovascular: Normal rate, regular rhythm and normal heart sounds.  Pulmonary/Chest: Effort normal and breath sounds normal.  Skin: Skin is warm and dry.  Psychiatric: She has a normal mood and affect.  Nursing note and vitals reviewed.       Assessment And Plan:     1. Chronic gout due to renal impairment involving toe without  tophus, unspecified laterality  I will check a uric acid level today. She will continue with current meds. She is encouraged to stay well hydrated. She will rto in 3 months for f/u.  - Uric acid  2. Drug therapy  - BMP8+EGFR        [name], MD

## 2018-09-18 LAB — BMP8+EGFR
BUN / CREAT RATIO: 19 (ref 12–28)
BUN: 22 mg/dL (ref 8–27)
CHLORIDE: 100 mmol/L (ref 96–106)
CO2: 24 mmol/L (ref 20–29)
CREATININE: 1.13 mg/dL — AB (ref 0.57–1.00)
Calcium: 9.8 mg/dL (ref 8.7–10.3)
GFR calc Af Amer: 57 mL/min/{1.73_m2} — ABNORMAL LOW (ref >59)
GFR calc non Af Amer: 49 mL/min/{1.73_m2} — ABNORMAL LOW (ref >59)
GLUCOSE: 116 mg/dL — AB (ref 65–99)
Potassium: 3.6 mmol/L (ref 3.5–5.2)
Sodium: 142 mmol/L (ref 134–144)

## 2018-09-18 LAB — URIC ACID: URIC ACID: 7.1 mg/dL (ref 2.5–7.1)

## 2018-09-18 NOTE — Progress Notes (Signed)
Here are your lab results:  Your uric acid level has improved, but not yet where it should be.  Continue with allopurinol once daily. I will recheck at  Your next visit. Stay well hydrated. Happy holidays!

## 2018-11-05 DIAGNOSIS — G5793 Unspecified mononeuropathy of bilateral lower limbs: Secondary | ICD-10-CM | POA: Diagnosis not present

## 2018-11-05 DIAGNOSIS — M67372 Transient synovitis, left ankle and foot: Secondary | ICD-10-CM | POA: Diagnosis not present

## 2018-11-07 DIAGNOSIS — M7752 Other enthesopathy of left foot: Secondary | ICD-10-CM | POA: Diagnosis not present

## 2018-11-07 DIAGNOSIS — M67372 Transient synovitis, left ankle and foot: Secondary | ICD-10-CM | POA: Diagnosis not present

## 2018-11-08 DIAGNOSIS — R3121 Asymptomatic microscopic hematuria: Secondary | ICD-10-CM | POA: Diagnosis not present

## 2018-11-16 ENCOUNTER — Other Ambulatory Visit: Payer: Self-pay | Admitting: Internal Medicine

## 2018-12-20 ENCOUNTER — Ambulatory Visit (INDEPENDENT_AMBULATORY_CARE_PROVIDER_SITE_OTHER): Payer: Medicare Other | Admitting: Internal Medicine

## 2018-12-20 ENCOUNTER — Other Ambulatory Visit: Payer: Self-pay

## 2018-12-20 ENCOUNTER — Encounter: Payer: Self-pay | Admitting: Internal Medicine

## 2018-12-20 VITALS — BP 126/70 | HR 78 | Temp 97.8°F | Ht 66.4 in | Wt 214.2 lb

## 2018-12-20 DIAGNOSIS — N183 Chronic kidney disease, stage 3 unspecified: Secondary | ICD-10-CM

## 2018-12-20 DIAGNOSIS — Z79899 Other long term (current) drug therapy: Secondary | ICD-10-CM | POA: Diagnosis not present

## 2018-12-20 DIAGNOSIS — I129 Hypertensive chronic kidney disease with stage 1 through stage 4 chronic kidney disease, or unspecified chronic kidney disease: Secondary | ICD-10-CM

## 2018-12-20 DIAGNOSIS — E6609 Other obesity due to excess calories: Secondary | ICD-10-CM

## 2018-12-20 DIAGNOSIS — E1122 Type 2 diabetes mellitus with diabetic chronic kidney disease: Secondary | ICD-10-CM | POA: Diagnosis not present

## 2018-12-20 DIAGNOSIS — M1A3790 Chronic gout due to renal impairment, unspecified ankle and foot, without tophus (tophi): Secondary | ICD-10-CM | POA: Diagnosis not present

## 2018-12-20 DIAGNOSIS — Z6834 Body mass index (BMI) 34.0-34.9, adult: Secondary | ICD-10-CM

## 2018-12-20 DIAGNOSIS — R202 Paresthesia of skin: Secondary | ICD-10-CM | POA: Diagnosis not present

## 2018-12-20 LAB — POCT URINALYSIS DIPSTICK
Bilirubin, UA: NEGATIVE
GLUCOSE UA: NEGATIVE
Ketones, UA: NEGATIVE
Leukocytes, UA: NEGATIVE
Nitrite, UA: NEGATIVE
Protein, UA: NEGATIVE
Spec Grav, UA: 1.02 (ref 1.010–1.025)
Urobilinogen, UA: 0.2 E.U./dL
pH, UA: 6.5 (ref 5.0–8.0)

## 2018-12-20 LAB — POCT UA - MICROALBUMIN
Albumin/Creatinine Ratio, Urine, POC: <30
CREATININE, POC: 200 mg/dL
Microalbumin Ur, POC: 10 mg/L

## 2018-12-20 MED ORDER — DICLOFENAC SODIUM 1 % TD GEL: 2.0000 g | Freq: Four times a day (QID) | TRANSDERMAL | 1 refills | Status: DC

## 2018-12-20 MED ORDER — METFORMIN HCL 500 MG PO TABS: 500.0000 mg | ORAL_TABLET | Freq: Every day | ORAL | 2 refills | Status: DC

## 2018-12-20 NOTE — Progress Notes (Signed)
Subjective:     Patient ID: Tammy Preston , female    DOB: 03/18/1946 , 73 y.o.   MRN: [id number]   Chief Complaint  Patient presents with  . Gout  . Diabetes  . Hypertension    HPI  She is here today for f/u gout. She was started on allopurinol 140m once daily at her last visit. She has not had any issues since starting the medication. She does report having some lingering foot pain.   Diabetes  She presents for her follow-up diabetic visit. She has type 2 diabetes mellitus. Her disease course has been stable. There are no hypoglycemic associated symptoms. Pertinent negatives for diabetes include no blurred vision and no chest pain. There are no hypoglycemic complications. Diabetic complications include nephropathy. Risk factors for coronary artery disease include diabetes mellitus, dyslipidemia, hypertension, post-menopausal, sedentary lifestyle and obesity. Her breakfast blood glucose is taken between 8-9 am. Her breakfast blood glucose range is generally 110-130 mg/dl. An ACE inhibitor/angiotensin II receptor blocker is being taken.  Hypertension  Pertinent negatives include no blurred vision or chest pain.   She reports compliance with medications.   Past Medical History:  Diagnosis Date  . Breast cancer ([place]   . Chronic kidney disease    CKD stage 3  . Chronic kidney disease, stage 3 (moderate) ([place] 05/20/2017  . Diabetes mellitus without complication ([place]   . Endometrial hyperplasia 05/20/2017  . 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 Hyperlipemia   . Hypertension   . Hypertensive chronic kidney disease with stage 1 through stage 4 chronic kidney disease, or unspecified chronic kidney disease 05/20/2017  . Numbness and tingling of both feet   . Type 2 diabetes mellitus with diabetic chronic kidney disease ([place] 05/20/2017     Family History  Problem Relation Age of Onset  . Stroke Mother      Current Outpatient Medications:  .   allopurinol (ZYLOPRIM) 100 MG tablet, Take 1 tablet (100 mg total) by mouth daily., Disp: 100 tablet, Rfl: 1 .  aspirin EC 81 MG tablet, Take 81 mg by mouth daily after breakfast. , Disp: , Rfl:  .  Calcium Carb-Cholecalciferol (CALCIUM + D3 PO), Take 1 tablet by mouth daily. , Disp: , Rfl:  .  Cholecalciferol (VITAMIN D) 2000 units tablet, Take 2,000 Units by mouth daily., Disp: , Rfl:  .  L-Methylfolate-B6-B12 (FOLTANX) 3-35-2 MG TABS, TAKE 1 TABLET BY MOUTH ONCE DAILY, Disp: 90 tablet, Rfl: 1 .  losartan (COZAAR) 25 MG tablet, TAKE 1 TABLET DAILY, Disp: 90 tablet, Rfl: 4 .  metFORMIN (GLUCOPHAGE) 500 MG tablet, Take 1 tablet (500 mg total) by mouth daily after breakfast., Disp: 90 tablet, Rfl: 2 .  Multiple Vitamins-Minerals (ONE-A-DAY WOMENS 50 PLUS PO), Take 1 tablet by mouth daily., Disp: , Rfl:  .  simvastatin (ZOCOR) 20 MG tablet, TAKE 1 TABLET DAILY, Disp: 90 tablet, Rfl: 1 .  diclofenac sodium (VOLTAREN) 1 % GEL, Apply 2 g topically 4 (four) times daily. As needed, Disp: 100 g, Rfl: 1   Allergies  Allergen Reactions  . Percocet [Oxycodone-Acetaminophen] Nausea And Vomiting  . Shellfish Allergy Other (See Comments)    Causes Gout     Review of Systems  Constitutional: Negative.   Eyes: Negative for blurred vision.  Respiratory: Negative.   Cardiovascular: Negative.  Negative for chest pain.  Gastrointestinal: Negative.   Neurological: Negative.   Psychiatric/Behavioral: Negative.      Today's Vitals   12/20/18 0955  BP: 126/70  Pulse: 78  Temp: 97.8 F (36.6 C)  TempSrc: Oral  Weight: 214 lb 3.2 oz (97.2 kg)  Height: 5' 6.4" (1.687 m)  PainSc: 0-No pain   Body mass index is 34.16 kg/m.   Objective:  Physical Exam Vitals signs and nursing note reviewed.  Constitutional:      Appearance: Normal appearance.  HENT:     Head: Normocephalic and atraumatic.  Cardiovascular:     Rate and Rhythm: Normal rate and regular rhythm.     Heart sounds: Normal heart sounds.   Pulmonary:     Effort: Pulmonary effort is normal.     Breath sounds: Normal breath sounds.  Skin:    General: Skin is warm.  Neurological:     General: No focal deficit present.     Mental Status: She is alert.  Psychiatric:        Mood and Affect: Mood normal.        Behavior: Behavior normal.         Assessment And Plan:     1. 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 She is advised that I may need to increase the dose of the allopurinol. I will make further recommendations once her labs are available for review.   - Uric acid  2. Diabetes mellitus with stage 3 chronic kidney disease ([place])  I will check labs as listed below. She is encouraged to follow dietary recommendations.   - CMP14+EGFR - Hemoglobin A1c - Phosphorus  3. Parenchymal renal hypertension, stage 1 through stage 4 or unspecified chronic kidney disease  Well controlled.  She is encouraged to avoid adding salt to her foods. I will STOP HCTZ (due to gout) and INCREASE Losartan to 40m 2 tabs daily. She will rto in four weeks for re-evaluation.   4. Paresthesia of both lower extremities  Patient is advised that her symptoms could be due to diabetic neuropathy vs. B12 deficiency. I will check a vit b12 level and make further recommendations once her labs are available for review.   5. Class 1 obesity due to excess calories with serious comorbidity and body mass index (BMI) of 34.0 to 34.9 in adult  Importance of achieving optimal weight to decrease risk of cardiovascular disease and cancers was discussed with the patient in full detail. She is encouraged to start slowly - start with 10 minutes twice daily at least three to four days per week and to gradually build to 30 minutes five days weekly. She was given tips to incorporate more activity into her daily routine - take stairs when possible, park farther away from her job,  grocery stores, etc.   6. Drug therapy  - Vitamin B12        RMaximino Greenland MD

## 2018-12-20 NOTE — Patient Instructions (Addendum)
STOP HYDROCHLOROTHIAZIDE - COULD BE CAUSING GOUT  START TAKING TWO LOSARTAN 25MG  TABLETS    Chronic Kidney Disease, Adult Chronic kidney disease (CKD) happens when the kidneys are damaged over a long period of time. The kidneys are two organs that help with:  Getting rid of waste and extra fluid from the blood.  Making hormones that maintain the amount of fluid in your tissues and blood vessels.  Making sure that the body has the right amount of fluids and chemicals. Most of the time, CKD does not go away, but it can usually be controlled. Steps must be taken to slow down the kidney damage or to stop it from getting worse. If this is not done, the kidneys may stop working. Follow these instructions at home: Medicines  Take over-the-counter and prescription medicines only as told by your doctor. You may need to change the amount of medicines you take.  Do not take any new medicines unless your doctor says it is okay. Many medicines can make your kidney damage worse.  Do not take any vitamin and supplements unless your doctor says it is okay. Many vitamins and supplements can make your kidney damage worse. General instructions  Follow a diet as told by your doctor. You may need to stay away from: ? Alcohol. ? Salty foods. ? Foods that are high in:  Potassium.  Calcium.  Protein.  Do not use any products that contain nicotine or tobacco, such as cigarettes and e-cigarettes. If you need help quitting, ask your doctor.  Keep track of your blood pressure at home. Tell your doctor about any changes.  If you have diabetes, keep track of your blood sugar as told by your doctor.  Try to stay at a healthy weight. If you need help, ask your doctor.  Exercise at least 30 minutes a day, 5 days a week.  Stay up-to-date with your shots (immunizations) as told by your doctor.  Keep all follow-up visits as told by your doctor. This is important. Contact a doctor if:  Your symptoms  get worse.  You have new symptoms. Get help right away if:  You have symptoms of end-stage kidney disease. These may include: ? Headaches. ? Numbness in your hands or feet. ? Easy bruising. ? Having hiccups often. ? Chest pain. ? Shortness of breath. ? Stopping of menstrual periods in women.  You have a fever.  You have very little pee (urine).  You have pain or bleeding when you pee. Summary  Chronic kidney disease (CKD) happens when the kidneys are damaged over a long period of time.  Most of the time, this condition does not go away, but it can usually be controlled. Steps must be taken to slow down the kidney damage or to stop it from getting worse.  Treatment may include a combination of medicines and lifestyle changes. This information is not intended to replace advice given to you by your health care provider. Make sure you discuss any questions you have with your health care provider. Document Released: 01/04/2010 Document Revised: 11/14/2016 Document Reviewed: 11/14/2016 Elsevier Interactive Patient Education  2019 [employer].

## 2018-12-21 ENCOUNTER — Telehealth: Payer: Self-pay

## 2018-12-21 LAB — CMP14+EGFR
A/G RATIO: 1.5 (ref 1.2–2.2)
ALBUMIN: 4.4 g/dL (ref 3.7–4.7)
ALT: 24 IU/L (ref 0–32)
AST: 22 IU/L (ref 0–40)
Alkaline Phosphatase: 84 IU/L (ref 39–117)
BILIRUBIN TOTAL: 0.3 mg/dL (ref 0.0–1.2)
BUN / CREAT RATIO: 18 (ref 12–28)
BUN: 21 mg/dL (ref 8–27)
CHLORIDE: 97 mmol/L (ref 96–106)
CO2: 25 mmol/L (ref 20–29)
Calcium: 10.1 mg/dL (ref 8.7–10.3)
Creatinine, Ser: 1.17 mg/dL — ABNORMAL HIGH (ref 0.57–1.00)
GFR, EST AFRICAN AMERICAN: 54 mL/min/{1.73_m2} — AB (ref >59)
GFR, EST NON AFRICAN AMERICAN: 47 mL/min/{1.73_m2} — AB (ref >59)
Globulin, Total: 2.9 g/dL (ref 1.5–4.5)
Glucose: 126 mg/dL — ABNORMAL HIGH (ref 65–99)
POTASSIUM: 4 mmol/L (ref 3.5–5.2)
Sodium: 139 mmol/L (ref 134–144)
TOTAL PROTEIN: 7.3 g/dL (ref 6.0–8.5)

## 2018-12-21 LAB — URIC ACID: Uric Acid: 6.9 mg/dL (ref 2.5–7.1)

## 2018-12-21 LAB — PHOSPHORUS: Phosphorus: 3.9 mg/dL (ref 3.0–4.3)

## 2018-12-21 LAB — HEMOGLOBIN A1C
Est. average glucose Bld gHb Est-mCnc: 154 mg/dL
Hgb A1c MFr Bld: 7 % — ABNORMAL HIGH (ref 4.8–5.6)

## 2018-12-21 LAB — VITAMIN B12: Vitamin B-12: >2000 pg/mL — ABNORMAL HIGH (ref 232–1245)

## 2018-12-21 NOTE — Telephone Encounter (Signed)
Left the pt a message to call back for her lab results. 

## 2018-12-21 NOTE — Telephone Encounter (Signed)
-----   Message from Glendale Chard, MD sent at 12/21/2018  9:48 AM EST ----- Your uric acid level is still elevated.  I would like to increase the allopurinol. Your vit b12 level is elevated. This is likely due to a sluggish liver. OR you are taking too much as a supplement. Your liver and kidney fxn are stable. Your hba1c is 7.0.

## 2019-01-21 ENCOUNTER — Ambulatory Visit (INDEPENDENT_AMBULATORY_CARE_PROVIDER_SITE_OTHER): Payer: Medicare Other | Admitting: Internal Medicine

## 2019-01-21 ENCOUNTER — Other Ambulatory Visit: Payer: Self-pay

## 2019-01-21 ENCOUNTER — Encounter: Payer: Self-pay | Admitting: Internal Medicine

## 2019-01-21 VITALS — BP 136/78 | HR 88 | Temp 98.5°F | Ht 66.4 in | Wt 214.4 lb

## 2019-01-21 DIAGNOSIS — M1A3790 Chronic gout due to renal impairment, unspecified ankle and foot, without tophus (tophi): Secondary | ICD-10-CM

## 2019-01-21 DIAGNOSIS — F17200 Nicotine dependence, unspecified, uncomplicated: Secondary | ICD-10-CM

## 2019-01-21 DIAGNOSIS — N183 Chronic kidney disease, stage 3 (moderate): Secondary | ICD-10-CM

## 2019-01-21 DIAGNOSIS — I129 Hypertensive chronic kidney disease with stage 1 through stage 4 chronic kidney disease, or unspecified chronic kidney disease: Secondary | ICD-10-CM

## 2019-01-21 MED ORDER — LOSARTAN POTASSIUM 50 MG PO TABS: ORAL_TABLET | ORAL | 2 refills | Status: DC

## 2019-01-21 MED ORDER — LOSARTAN POTASSIUM 25 MG PO TABS: ORAL_TABLET | ORAL | 1 refills | Status: DC

## 2019-01-21 NOTE — Progress Notes (Signed)
Subjective:     Patient ID: Tammy Preston , female    DOB: 12/08/1945 , 73 y.o.   MRN: [id number]   Chief Complaint  Patient presents with  . Hypertension    HPI  She is here today for a bp check. Hydrochlorothiazide was discontinued at her last visit since it was thought to contribute to her gout flares. She is now taking two tablets of losartan 22m daily. She has not had any issues with her new regimen.   Hypertension  This is a chronic problem. The current episode started more than 1 year ago. The problem has been gradually improving since onset. The problem is controlled. Pertinent negatives include no anxiety.     Past Medical History:  Diagnosis Date  . Breast cancer ([facility]   . Chronic kidney disease    CKD stage 3  . Chronic kidney disease, stage 3 (moderate) ([facility] 05/20/2017  . Diabetes mellitus without complication ([facility]   . Endometrial hyperplasia 05/20/2017  . 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 Hyperlipemia   . Hypertension   . Hypertensive chronic kidney disease with stage 1 through stage 4 chronic kidney disease, or unspecified chronic kidney disease 05/20/2017  . Numbness and tingling of both feet   . Type 2 diabetes mellitus with diabetic chronic kidney disease ([facility] 05/20/2017     Family History  Problem Relation Age of Onset  . Stroke Mother      Current Outpatient Medications:  .  allopurinol (ZYLOPRIM) 100 MG tablet, Take 1 tablet (100 mg total) by mouth daily., Disp: 100 tablet, Rfl: 1 .  aspirin EC 81 MG tablet, Take 81 mg by mouth daily after breakfast. , Disp: , Rfl:  .  Calcium Carb-Cholecalciferol (CALCIUM + D3 PO), Take 1 tablet by mouth daily. , Disp: , Rfl:  .  Cholecalciferol (VITAMIN D) 2000 units tablet, Take 2,000 Units by mouth daily., Disp: , Rfl:  .  diclofenac sodium (VOLTAREN) 1 % GEL, Apply 2 g topically 4 (four) times daily. As needed, Disp: 100 g, Rfl: 1 .  metFORMIN (GLUCOPHAGE) 500 MG tablet, Take 1  tablet (500 mg total) by mouth daily after breakfast., Disp: 90 tablet, Rfl: 2 .  Multiple Vitamins-Minerals (ONE-A-DAY WOMENS 50 PLUS PO), Take 1 tablet by mouth daily., Disp: , Rfl:  .  simvastatin (ZOCOR) 20 MG tablet, TAKE 1 TABLET DAILY, Disp: 90 tablet, Rfl: 1 .  losartan (COZAAR) 50 MG tablet, One tab po qd, Disp: 90 tablet, Rfl: 2   Allergies  Allergen Reactions  . Percocet [Oxycodone-Acetaminophen] Nausea And Vomiting  . Shellfish Allergy Other (See Comments)    Causes Gout     Review of Systems  Constitutional: Negative.   Respiratory: Negative.   Cardiovascular: Negative.   Gastrointestinal: Negative.   Neurological: Negative.   Psychiatric/Behavioral: Negative.      Today's Vitals   01/21/19 1157  BP: 136/78  Pulse: 88  Temp: 98.5 F (36.9 C)  TempSrc: Oral  Weight: 214 lb 6.4 oz (97.3 kg)  Height: 5' 6.4" (1.687 m)  PainSc: 0-No pain   Body mass index is 34.19 kg/m.   Objective:  Physical Exam Vitals signs and nursing note reviewed.  Constitutional:      Appearance: Normal appearance.  HENT:     Head: Normocephalic and atraumatic.  Cardiovascular:     Rate and Rhythm: Normal rate and regular rhythm.     Heart sounds: Normal heart sounds.  Pulmonary:     Effort:  Pulmonary effort is normal.     Breath sounds: Normal breath sounds.  Skin:    General: Skin is warm.  Neurological:     General: No focal deficit present.     Mental Status: She is alert.  Psychiatric:        Mood and Affect: Mood normal.        Behavior: Behavior normal.         Assessment And Plan:     1. Parenchymal renal hypertension, stage 1 through stage 4 or unspecified chronic kidney disease  Fair control. She will continue with losartan 36m once daily. She is encouraged to incorporate more exercise into her daily routine. She is advised to aim for 30 minutes five days weekly. She will rto in June 2020 for her next dm f/u.   - BMP8+EGFR  2. 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 Her HCTZ was discontinued at last visit, I am hoping her uric acid level is lower today.   - Uric acid  3. Tobacco use disorder  Importance of smoking cessation was discussed with the patient for greater than 3 minutes. She does not have any desire to quit at this time. She is encouraged to try to decrease number of cigs smoked per day.   RMaximino Greenland MD

## 2019-01-21 NOTE — Patient Instructions (Addendum)
Gout  Gout is painful swelling of your joints. Gout is a type of arthritis. It is caused by having too much uric acid in your body. Uric acid is a chemical that is made when your body breaks down substances called purines. If your body has too much uric acid, sharp crystals can form and build up in your joints. This causes pain and swelling. Gout attacks can happen quickly and be very painful (acute gout). Over time, the attacks can affect more joints and happen more often (chronic gout). What are the causes?  Too much uric acid in your blood. This can happen because: ? Your kidneys do not remove enough uric acid from your blood. ? Your body makes too much uric acid. ? You eat too many foods that are high in purines. These foods include organ meats, some seafood, and beer.  Trauma or stress. What increases the risk?  Having a family history of gout.  Being female and middle-aged.  Being female and having gone through menopause.  Being very overweight (obese).  Drinking alcohol, especially beer.  Not having enough water in the body (being dehydrated).  Losing weight too quickly.  Having an organ transplant.  Having lead poisoning.  Taking certain medicines.  Having kidney disease.  Having a skin condition called psoriasis. What are the signs or symptoms? An attack of acute gout usually happens in just one joint. The most common place is the big toe. Attacks often start at night. Other joints that may be affected include joints of the feet, ankle, knee, fingers, wrist, or elbow. Symptoms of an attack may include:  Very bad pain.  Warmth.  Swelling.  Stiffness.  Shiny, red, or purple skin.  Tenderness. The affected joint may be very painful to touch.  Chills and fever. Chronic gout may cause symptoms more often. More joints may be involved. You may also have white or yellow lumps (tophi) on your hands or feet or in other areas near your joints. How is this treated?   Treatment for this condition has two phases: treating an acute attack and preventing future attacks.  Acute gout treatment may include: ? NSAIDs. ? Steroids. These are taken by mouth or injected into a joint. ? Colchicine. This medicine relieves pain and swelling. It can be given by mouth or through an IV tube.  Preventive treatment may include: ? Taking small doses of NSAIDs or colchicine daily. ? Using a medicine that reduces uric acid levels in your blood. ? Making changes to your diet. You may need to see a food expert (dietitian) about what to eat and drink to prevent gout. Follow these instructions at home: During a gout attack   If told, put ice on the painful area: ? Put ice in a plastic bag. ? Place a towel between your skin and the bag. ? Leave the ice on for 20 minutes, 2-3 times a day.  Raise (elevate) the painful joint above the level of your heart as often as you can.  Rest the joint as much as possible. If the joint is in your leg, you may be given crutches.  Follow instructions from your doctor about what you cannot eat or drink. Avoiding future gout attacks  Eat a low-purine diet. Avoid foods and drinks such as: ? Liver. ? Kidney. ? Anchovies. ? Asparagus. ? Herring. ? Mushrooms. ? Mussels. ? Beer.  Stay at a healthy weight. If you want to lose weight, talk with your doctor. Do not lose weight  too fast.  Start or continue an exercise plan as told by your doctor. Eating and drinking  Drink enough fluids to keep your pee (urine) pale yellow.  If you drink alcohol: ? Limit how much you use to:  0-1 drink a day for women.  0-2 drinks a day for men. ? Be aware of how much alcohol is in your drink. In the [place] one drink equals one 12 oz bottle of beer (355 mL), one 5 oz glass of wine (148 mL), or one 1 oz glass of hard liquor (44 mL). General instructions  Take over-the-counter and prescription medicines only as told by your doctor.  Do not drive or  use heavy machinery while taking prescription pain medicine.  Return to your normal activities as told by your doctor. Ask your doctor what activities are safe for you.  Keep all follow-up visits as told by your doctor. This is important. Contact a doctor if:  You have another gout attack.  You still have symptoms of a gout attack after 10 days of treatment.  You have problems (side effects) because of your medicines.  You have chills or a fever.  You have burning pain when you pee (urinate).  You have pain in your lower back or belly. Get help right away if:  You have very bad pain.  Your pain cannot be controlled.  You cannot pee. Summary  Gout is painful swelling of the joints.  The most common site of pain is the big toe, but it can affect other joints.  Medicines and avoiding some foods can help to prevent and treat gout attacks. This information is not intended to replace advice given to you by your health care provider. Make sure you discuss any questions you have with your health care provider. Document Released: 07/19/2008 Document Revised: 05/02/2018 Document Reviewed: 05/02/2018 Elsevier Interactive Patient Education  2019 [employer] with Quitting Smoking  Quitting smoking is a physical and mental challenge. You will face cravings, withdrawal symptoms, and temptation. Before quitting, work with your health care provider to make a plan that can help you cope. Preparation can help you quit and keep you from giving in. How can I cope with cravings? Cravings usually last for 5-10 minutes. If you get through it, the craving will pass. Consider taking the following actions to help you cope with cravings:  Keep your mouth busy: ? Chew sugar-free gum. ? Suck on hard candies or a straw. ? Brush your teeth.  Keep your hands and body busy: ? Immediately change to a different activity when you feel a craving. ? Squeeze or play with a ball. ? Do an  activity or a hobby, like making bead jewelry, practicing needlepoint, or working with wood. ? Mix up your normal routine. ? Take a short exercise break. Go for a quick walk or run up and down stairs. ? Spend time in public places where smoking is not allowed.  Focus on doing something kind or helpful for someone else.  Call a friend or family member to talk during a craving.  Join a support group.  Call a quit line, such as 1-800-QUIT-NOW.  Talk with your health care provider about medicines that might help you cope with cravings and make quitting easier for you. How can I deal with withdrawal symptoms? Your body may experience negative effects as it tries to get used to not having nicotine in the system. These effects are called withdrawal symptoms. They may include:  Feeling hungrier than normal.  Trouble concentrating.  Irritability.  Trouble sleeping.  Feeling depressed.  Restlessness and agitation.  Craving a cigarette. To manage withdrawal symptoms:  Avoid places, people, and activities that trigger your cravings.  Remember why you want to quit.  Get plenty of sleep.  Avoid coffee and other caffeinated drinks. These may worsen some of your symptoms. How can I handle social situations? Social situations can be difficult when you are quitting smoking, especially in the first few weeks. To manage this, you can:  Avoid parties, bars, and other social situations where people might be smoking.  Avoid alcohol.  Leave right away if you have the urge to smoke.  Explain to your family and friends that you are quitting smoking. Ask for understanding and support.  Plan activities with friends or family where smoking is not an option. What are some ways I can cope with stress? Wanting to smoke may cause stress, and stress can make you want to smoke. Find ways to manage your stress. Relaxation techniques can help. For example:  Breathe slowly and deeply, in through your  nose and out through your mouth.  Listen to soothing, relaxing music.  Talk with a family member or friend about your stress.  Light a candle.  Soak in a bath or take a shower.  Think about a peaceful place. What are some ways I can prevent weight gain? Be aware that many people gain weight after they quit smoking. However, not everyone does. To keep from gaining weight, have a plan in place before you quit and stick to the plan after you quit. Your plan should include:  Having healthy snacks. When you have a craving, it may help to: ? Eat plain popcorn, crunchy carrots, celery, or other cut vegetables. ? Chew sugar-free gum.  Changing how you eat: ? Eat small portion sizes at meals. ? Eat 4-6 small meals throughout the day instead of 1-2 large meals a day. ? Be mindful when you eat. Do not watch television or do other things that might distract you as you eat.  Exercising regularly: ? Make time to exercise each day. If you do not have time for a long workout, do short bouts of exercise for 5-10 minutes several times a day. ? Do some form of strengthening exercise, like weight lifting, and some form of aerobic exercise, like running or swimming.  Drinking plenty of water or other low-calorie or no-calorie drinks. Drink 6-8 glasses of water daily, or as much as instructed by your health care provider. Summary  Quitting smoking is a physical and mental challenge. You will face cravings, withdrawal symptoms, and temptation to smoke again. Preparation can help you as you go through these challenges.  You can cope with cravings by keeping your mouth busy (such as by chewing gum), keeping your body and hands busy, and making calls to family, friends, or a helpline for people who want to quit smoking.  You can cope with withdrawal symptoms by avoiding places where people smoke, avoiding drinks with caffeine, and getting plenty of rest.  Ask your health care provider about the different  ways to prevent weight gain, avoid stress, and handle social situations. This information is not intended to replace advice given to you by your health care provider. Make sure you discuss any questions you have with your health care provider. Document Released: 10/07/2016 Document Revised: 10/07/2016 Document Reviewed: 10/07/2016 Elsevier Interactive Patient Education  2019 [employer].

## 2019-01-22 LAB — BMP8+EGFR
BUN/Creatinine Ratio: 15 (ref 12–28)
BUN: 18 mg/dL (ref 8–27)
CO2: 22 mmol/L (ref 20–29)
CREATININE: 1.24 mg/dL — AB (ref 0.57–1.00)
Calcium: 9.9 mg/dL (ref 8.7–10.3)
Chloride: 103 mmol/L (ref 96–106)
GFR calc Af Amer: 50 mL/min/{1.73_m2} — ABNORMAL LOW (ref >59)
GFR calc non Af Amer: 44 mL/min/{1.73_m2} — ABNORMAL LOW (ref >59)
Glucose: 110 mg/dL — ABNORMAL HIGH (ref 65–99)
Potassium: 4.3 mmol/L (ref 3.5–5.2)
Sodium: 146 mmol/L — ABNORMAL HIGH (ref 134–144)

## 2019-01-22 LAB — URIC ACID: Uric Acid: 5.8 mg/dL (ref 2.5–7.1)

## 2019-01-23 ENCOUNTER — Other Ambulatory Visit: Payer: Self-pay

## 2019-02-14 ENCOUNTER — Other Ambulatory Visit: Payer: Self-pay

## 2019-02-14 MED ORDER — ALLOPURINOL 100 MG PO TABS: 100.0000 mg | ORAL_TABLET | Freq: Every day | ORAL | 1 refills | Status: DC

## 2019-03-13 ENCOUNTER — Other Ambulatory Visit: Payer: Self-pay | Admitting: Internal Medicine

## 2019-04-11 ENCOUNTER — Other Ambulatory Visit: Payer: Self-pay

## 2019-04-11 ENCOUNTER — Ambulatory Visit (INDEPENDENT_AMBULATORY_CARE_PROVIDER_SITE_OTHER): Payer: Medicare Other | Admitting: Podiatry

## 2019-04-11 ENCOUNTER — Ambulatory Visit (INDEPENDENT_AMBULATORY_CARE_PROVIDER_SITE_OTHER): Payer: Medicare Other

## 2019-04-11 DIAGNOSIS — M779 Enthesopathy, unspecified: Secondary | ICD-10-CM

## 2019-04-11 DIAGNOSIS — E1149 Type 2 diabetes mellitus with other diabetic neurological complication: Secondary | ICD-10-CM | POA: Diagnosis not present

## 2019-04-11 DIAGNOSIS — Q828 Other specified congenital malformations of skin: Secondary | ICD-10-CM

## 2019-04-11 DIAGNOSIS — R52 Pain, unspecified: Secondary | ICD-10-CM

## 2019-04-11 DIAGNOSIS — M79675 Pain in left toe(s): Secondary | ICD-10-CM | POA: Diagnosis not present

## 2019-04-11 DIAGNOSIS — M79674 Pain in right toe(s): Secondary | ICD-10-CM

## 2019-04-11 DIAGNOSIS — L600 Ingrowing nail: Secondary | ICD-10-CM | POA: Diagnosis not present

## 2019-04-11 DIAGNOSIS — B351 Tinea unguium: Secondary | ICD-10-CM | POA: Diagnosis not present

## 2019-04-11 DIAGNOSIS — M25471 Effusion, right ankle: Secondary | ICD-10-CM

## 2019-04-11 MED ORDER — GABAPENTIN 100 MG PO CAPS: 100.0000 mg | ORAL_CAPSULE | Freq: Every day | ORAL | 0 refills | Status: DC

## 2019-04-11 MED ORDER — DICLOFENAC SODIUM 1 % TD GEL: 2.0000 g | Freq: Four times a day (QID) | TRANSDERMAL | 1 refills | Status: DC

## 2019-04-11 NOTE — Patient Instructions (Signed)
If was nice to meet you today. If you have any questions or any further concerns, please feel fee to give me a call. You can call our office at [phone] or please feel fee to send me a message through [place].    Diabetes Mellitus and Foot Care Foot care is an important part of your health, especially when you have diabetes. Diabetes may cause you to have problems because of poor blood flow (circulation) to your feet and legs, which can cause your skin to:  Become thinner and drier.  Break more easily.  Heal more slowly.  Peel and crack. You may also have nerve damage (neuropathy) in your legs and feet, causing decreased feeling in them. This means that you may not notice minor injuries to your feet that could lead to more serious problems. Noticing and addressing any potential problems early is the best way to prevent future foot problems. How to care for your feet Foot hygiene  Wash your feet daily with warm water and mild soap. Do not use hot water. Then, pat your feet and the areas between your toes until they are completely dry. Do not soak your feet as this can dry your skin.  Trim your toenails straight across. Do not dig under them or around the cuticle. File the edges of your nails with an emery board or nail file.  Apply a moisturizing lotion or petroleum jelly to the skin on your feet and to dry, brittle toenails. Use lotion that does not contain alcohol and is unscented. Do not apply lotion between your toes. Shoes and socks  Wear clean socks or stockings every day. Make sure they are not too tight. Do not wear knee-high stockings since they may decrease blood flow to your legs.  Wear shoes that fit properly and have enough cushioning. Always look in your shoes before you put them on to be sure there are no objects inside.  To break in new shoes, wear them for just a few hours a day. This prevents injuries on your feet. Wounds, scrapes, corns, and calluses  Check your  feet daily for blisters, cuts, bruises, sores, and redness. If you cannot see the bottom of your feet, use a mirror or ask someone for help.  Do not cut corns or calluses or try to remove them with medicine.  If you find a minor scrape, cut, or break in the skin on your feet, keep it and the skin around it clean and dry. You may clean these areas with mild soap and water. Do not clean the area with peroxide, alcohol, or iodine.  If you have a wound, scrape, corn, or callus on your foot, look at it several times a day to make sure it is healing and not infected. Check for: ? Redness, swelling, or pain. ? Fluid or blood. ? Warmth. ? Pus or a bad smell. General instructions  Do not cross your legs. This may decrease blood flow to your feet.  Do not use heating pads or hot water bottles on your feet. They may burn your skin. If you have lost feeling in your feet or legs, you may not know this is happening until it is too late.  Protect your feet from hot and cold by wearing shoes, such as at the beach or on hot pavement.  Schedule a complete foot exam at least once a year (annually) or more often if you have foot problems. If you have foot problems, report any cuts, sores,  or bruises to your health care provider immediately. Contact a health care provider if:  You have a medical condition that increases your risk of infection and you have any cuts, sores, or bruises on your feet.  You have an injury that is not healing.  You have redness on your legs or feet.  You feel burning or tingling in your legs or feet.  You have pain or cramps in your legs and feet.  Your legs or feet are numb.  Your feet always feel cold.  You have pain around a toenail. Get help right away if:  You have a wound, scrape, corn, or callus on your foot and: ? You have pain, swelling, or redness that gets worse. ? You have fluid or blood coming from the wound, scrape, corn, or callus. ? Your wound, scrape,  corn, or callus feels warm to the touch. ? You have pus or a bad smell coming from the wound, scrape, corn, or callus. ? You have a fever. ? You have a red line going up your leg. Summary  Check your feet every day for cuts, sores, red spots, swelling, and blisters.  Moisturize feet and legs daily.  Wear shoes that fit properly and have enough cushioning.  If you have foot problems, report any cuts, sores, or bruises to your health care provider immediately.  Schedule a complete foot exam at least once a year (annually) or more often if you have foot problems. This information is not intended to replace advice given to you by your health care provider. Make sure you discuss any questions you have with your health care provider. Document Released: 10/07/2000 Document Revised: 11/22/2017 Document Reviewed: 11/11/2016 Elsevier Interactive Patient Education  2019 [employer]. Gabapentin capsules or tablets What is this medicine? GABAPENTIN (GA ba pen tin) is used to control seizures in certain types of epilepsy. It is also used to treat certain types of nerve pain. This medicine may be used for other purposes; ask your health care provider or pharmacist if you have questions. COMMON BRAND NAME(S): Active-PAC with Gabapentin, Gabarone, Neurontin What should I tell my health care provider before I take this medicine? They need to know if you have any of these conditions: -kidney disease -suicidal thoughts, plans, or attempt; a previous suicide attempt by you or a family member -an unusual or allergic reaction to gabapentin, other medicines, foods, dyes, or preservatives -pregnant or trying to get pregnant -breast-feeding How should I use this medicine? Take this medicine by mouth with a glass of water. Follow the directions on the prescription label. You can take it with or without food. If it upsets your stomach, take it with food. Take your medicine at regular intervals. Do not take it  more often than directed. Do not stop taking except on your doctor's advice. If you are directed to break the 600 or 800 mg tablets in half as part of your dose, the extra half tablet should be used for the next dose. If you have not used the extra half tablet within 28 days, it should be thrown away. A special MedGuide will be given to you by the pharmacist with each prescription and refill. Be sure to read this information carefully each time. Talk to your pediatrician regarding the use of this medicine in children. While this drug may be prescribed for children as young as 3 years for selected conditions, precautions do apply. Overdosage: If you think you have taken too much of this medicine contact  a poison control center or emergency room at once. NOTE: This medicine is only for you. Do not share this medicine with others. What if I miss a dose? If you miss a dose, take it as soon as you can. If it is almost time for your next dose, take only that dose. Do not take double or extra doses. What may interact with this medicine? Do not take this medicine with any of the following medications: -other gabapentin products This medicine may also interact with the following medications: -alcohol -antacids -antihistamines for allergy, cough and cold -certain medicines for anxiety or sleep -certain medicines for depression or psychotic disturbances -homatropine; hydrocodone -naproxen -narcotic medicines (opiates) for pain -phenothiazines like chlorpromazine, mesoridazine, prochlorperazine, thioridazine This list may not describe all possible interactions. Give your health care provider a list of all the medicines, herbs, non-prescription drugs, or dietary supplements you use. Also tell them if you smoke, drink alcohol, or use illegal drugs. Some items may interact with your medicine. What should I watch for while using this medicine? Visit your doctor or health care professional for regular checks  on your progress. You may want to keep a record at home of how you feel your condition is responding to treatment. You may want to share this information with your doctor or health care professional at each visit. You should contact your doctor or health care professional if your seizures get worse or if you have any new types of seizures. Do not stop taking this medicine or any of your seizure medicines unless instructed by your doctor or health care professional. Stopping your medicine suddenly can increase your seizures or their severity. Wear a medical identification bracelet or chain if you are taking this medicine for seizures, and carry a card that lists all your medications. You may get drowsy, dizzy, or have blurred vision. Do not drive, use machinery, or do anything that needs mental alertness until you know how this medicine affects you. To reduce dizzy or fainting spells, do not sit or stand up quickly, especially if you are an older patient. Alcohol can increase drowsiness and dizziness. Avoid alcoholic drinks. Your mouth may get dry. Chewing sugarless gum or sucking hard candy, and drinking plenty of water will help. The use of this medicine may increase the chance of suicidal thoughts or actions. Pay special attention to how you are responding while on this medicine. Any worsening of mood, or thoughts of suicide or dying should be reported to your health care professional right away. Women who become pregnant while using this medicine may enroll in the [place] Pregnancy Registry by calling [phone]. This registry collects information about the safety of antiepileptic drug use during pregnancy. What side effects may I notice from receiving this medicine? Side effects that you should report to your doctor or health care professional as soon as possible: -allergic reactions like skin rash, itching or hives, swelling of the face, lips, or tongue -worsening of mood,  thoughts or actions of suicide or dying Side effects that usually do not require medical attention (report to your doctor or health care professional if they continue or are bothersome): -constipation -difficulty walking or controlling muscle movements -dizziness -nausea -slurred speech -tiredness -tremors -weight gain This list may not describe all possible side effects. Call your doctor for medical advice about side effects. You may report side effects to FDA at 1-800-FDA-1088. Where should I keep my medicine? Keep out of reach of children. This medicine may cause  accidental overdose and death if it taken by other adults, children, or pets. Mix any unused medicine with a substance like cat litter or coffee grounds. Then throw the medicine away in a sealed container like a sealed bag or a coffee can with a lid. Do not use the medicine after the expiration date. Store at room temperature between 15 and 30 degrees C (59 and 86 degrees F). NOTE: This sheet is a summary. It may not cover all possible information. If you have questions about this medicine, talk to your doctor, pharmacist, or health care provider.  2019 Elsevier/Gold Standard (2018-03-15 13:21:44)

## 2019-04-12 NOTE — Progress Notes (Signed)
Subjective:   Patient ID: Tammy Preston, female   DOB: 73 y.o.   MRN: [id number]   HPI 73 year old female presents the office today for diabetic foot exam but also because she has been swelling to her right ankle.  She also notices some discomfort to the right big toe area which is been on for the last 2 to 3 months.  She has no pain to her ankle.  She states that she has a history of gout to her left foot.  She also complains of burning at nighttime.  She has not been diagnosed with neuropathy.  Also describing itching sensation to her feet at times.  She is diabetic and her last A1c was 7.0.  She has no history of ulceration.  Denies any claudication symptoms.   Review of Systems  All other systems reviewed and are negative.  Past Medical History:  Diagnosis Date  . Breast cancer ([place])   . Chronic kidney disease    CKD stage 3  . Chronic kidney disease, stage 3 (moderate) ([place]) 05/20/2017  . Diabetes mellitus without complication ([place])   . Endometrial hyperplasia 05/20/2017  . 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 Hyperlipemia   . Hypertension   . Hypertensive chronic kidney disease with stage 1 through stage 4 chronic kidney disease, or unspecified chronic kidney disease 05/20/2017  . Numbness and tingling of both feet   . Type 2 diabetes mellitus with diabetic chronic kidney disease ([place]) 05/20/2017    Past Surgical History:  Procedure Laterality Date  . BACK SURGERY    . BREAST LUMPECTOMY Left   . COLONOSCOPY    . DILATATION & CURETTAGE/HYSTEROSCOPY WITH MYOSURE N/A 04/07/2017   Procedure: DILATATION & CURETTAGE/HYSTEROSCOPY WITH MYOSURE;  Surgeon: Servando Salina, MD;  Location: [facility];  Service: Gynecology;  Laterality: N/A;  . KNEE SURGERY       Current Outpatient Medications:  .  allopurinol (ZYLOPRIM) 100 MG tablet, Take 1 tablet (100 mg total) by mouth daily., Disp: 90 tablet, Rfl: 1 .  aspirin EC 81 MG tablet, Take 81 mg by mouth daily  after breakfast. , Disp: , Rfl:  .  Calcium Carb-Cholecalciferol (CALCIUM + D3 PO), Take 1 tablet by mouth daily. , Disp: , Rfl:  .  Cholecalciferol (VITAMIN D) 2000 units tablet, Take 2,000 Units by mouth daily., Disp: , Rfl:  .  diclofenac sodium (VOLTAREN) 1 % GEL, Apply 2 g topically 4 (four) times daily. As needed, Disp: 100 g, Rfl: 1 .  gabapentin (NEURONTIN) 100 MG capsule, Take 1 capsule (100 mg total) by mouth at bedtime., Disp: 90 capsule, Rfl: 0 .  losartan (COZAAR) 50 MG tablet, One tab po qd, Disp: 90 tablet, Rfl: 2 .  metFORMIN (GLUCOPHAGE) 500 MG tablet, Take 1 tablet (500 mg total) by mouth daily after breakfast., Disp: 90 tablet, Rfl: 2 .  Multiple Vitamins-Minerals (ONE-A-DAY WOMENS 50 PLUS PO), Take 1 tablet by mouth daily., Disp: , Rfl:  .  simvastatin (ZOCOR) 20 MG tablet, TAKE 1 TABLET DAILY, Disp: 90 tablet, Rfl: 3  Allergies  Allergen Reactions  . Percocet [Oxycodone-Acetaminophen] Nausea And Vomiting  . Shellfish Allergy Other (See Comments)    Causes Gout         Objective:  Physical Exam  General: AAO x3, NAD  Dermatological: Nails appear to be hypertrophic, dystrophic, discolored.  Subjectively there is tenderness nails 1-5 bilaterally mostly with shoes.  The right hallux toenail is mildly incurvated on the medial aspect cause  discomfort at the distal aspect there is no edema, erythema, increased warmth.  No open lesions are identified.  Hyperkeratotic lesion right foot submetatarsal 5.  No ongoing ulceration, drainage or any signs of infection.  Vascular: Dorsalis Pedis artery and Posterior Tibial artery pedal pulses are 2/4 bilateral with immedate capillary fill time. There is no pain with calf compression, swelling, warmth, erythema.   Neruologic: Sensation mildly decreased with Semmes Weinstein monofilament.  Musculoskeletal: There is no tenderness identified to the right ankle and there is no erythema.  There is localized edema mostly the lateral aspect  of foot more along the sinus tarsi inferior to the ankle.  Ankle, subtalar joint range of motion intact.  Muscular strength 5/5 in all groups tested bilateral.  Gait: Unassisted, Nonantalgic.      Assessment:   73 year old female with type 2 diabetes with neuropathy symptoms, symptomatic hyperkeratotic lesion/onychomycosis/ingrown toenail; swelling     Plan:  -Treatment options discussed including all alternatives, risks, and complications -Etiology of symptoms were discussed -X-rays were obtained and reviewed with the patient. There is no evidence of acute fracture or stress fracture identified today.  Swelling present to the lateral aspect of the foot. -She has no pain to the ankle.  Compression anklet was dispensed.  Elevation. -Debrided the nails x10 without any complications or bleeding.  After debridement there is resolution of pain with ingrown toenail -Hyperkeratotic lesion sharply debrided x1 without any complications or bleeding discussed moisturizer daily. -Discussed importance of daily foot inspection -In regards to neuropathy she will start treatment.  I prescribed gabapentin 100 mg at nighttime. -Refill Voltaren gel as needed.  Discussed to use sparingly and only as needed  Return in about 6 weeks (around 05/23/2019).  Neuropathy check  Trula Slade DPM

## 2019-04-15 ENCOUNTER — Other Ambulatory Visit: Payer: Self-pay | Admitting: Podiatry

## 2019-04-15 DIAGNOSIS — M779 Enthesopathy, unspecified: Secondary | ICD-10-CM

## 2019-04-16 ENCOUNTER — Other Ambulatory Visit: Payer: Self-pay | Admitting: *Deleted

## 2019-04-16 DIAGNOSIS — Z20822 Contact with and (suspected) exposure to covid-19: Secondary | ICD-10-CM

## 2019-04-16 DIAGNOSIS — R6889 Other general symptoms and signs: Secondary | ICD-10-CM | POA: Diagnosis not present

## 2019-04-19 LAB — NOVEL CORONAVIRUS, NAA: SARS-CoV-2, NAA: NOT DETECTED

## 2019-04-23 ENCOUNTER — Ambulatory Visit (INDEPENDENT_AMBULATORY_CARE_PROVIDER_SITE_OTHER): Payer: Medicare Other | Admitting: Internal Medicine

## 2019-04-23 ENCOUNTER — Encounter: Payer: Self-pay | Admitting: Internal Medicine

## 2019-04-23 ENCOUNTER — Other Ambulatory Visit: Payer: Self-pay

## 2019-04-23 VITALS — BP 130/72 | HR 85 | Temp 98.3°F | Ht 66.4 in | Wt 215.0 lb

## 2019-04-23 DIAGNOSIS — E6609 Other obesity due to excess calories: Secondary | ICD-10-CM

## 2019-04-23 DIAGNOSIS — I129 Hypertensive chronic kidney disease with stage 1 through stage 4 chronic kidney disease, or unspecified chronic kidney disease: Secondary | ICD-10-CM | POA: Diagnosis not present

## 2019-04-23 DIAGNOSIS — M1A3790 Chronic gout due to renal impairment, unspecified ankle and foot, without tophus (tophi): Secondary | ICD-10-CM

## 2019-04-23 DIAGNOSIS — N183 Chronic kidney disease, stage 3 unspecified: Secondary | ICD-10-CM

## 2019-04-23 DIAGNOSIS — E1122 Type 2 diabetes mellitus with diabetic chronic kidney disease: Secondary | ICD-10-CM

## 2019-04-23 DIAGNOSIS — E66811 Obesity, class 1: Secondary | ICD-10-CM

## 2019-04-23 DIAGNOSIS — Z6834 Body mass index (BMI) 34.0-34.9, adult: Secondary | ICD-10-CM | POA: Diagnosis not present

## 2019-04-23 NOTE — Progress Notes (Signed)
Subjective:     Patient ID: Tammy Preston , female    DOB: 04-17-1946 , 73 y.o.   MRN: [id number]   Chief Complaint  Patient presents with  . Hypertension  . Diabetes    HPI     Hypertension This is a chronic problem. The current episode started more than 1 year ago. The problem has been gradually improving since onset. The problem is controlled. Pertinent negatives include no anxiety, blurred vision or chest pain. Risk factors for coronary artery disease include diabetes mellitus, dyslipidemia, post-menopausal state and sedentary lifestyle. Past treatments include angiotensin blockers. The current treatment provides moderate improvement. Compliance problems include exercise.  Identifiable causes of hypertension include chronic renal disease.  Diabetes She presents for her follow-up diabetic visit. She has type 2 diabetes mellitus. There are no hypoglycemic associated symptoms. There are no diabetic associated symptoms. Pertinent negatives for diabetes include no blurred vision and no chest pain. There are no hypoglycemic complications. Diabetic complications include nephropathy. She is compliant with treatment most of the time. She participates in exercise three times a week. An ACE inhibitor/angiotensin II receptor blocker is being taken.     Past Medical History:  Diagnosis Date  . Breast cancer ([place])   . Chronic kidney disease    CKD stage 3  . Chronic kidney disease, stage 3 (moderate) ([place]) 05/20/2017  . Diabetes mellitus without complication ([place])   . Endometrial hyperplasia 05/20/2017  . 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 Hyperlipemia   . Hypertension   . Hypertensive chronic kidney disease with stage 1 through stage 4 chronic kidney disease, or unspecified chronic kidney disease 05/20/2017  . Numbness and tingling of both feet   . Type 2 diabetes mellitus with diabetic chronic kidney disease ([place]) 05/20/2017     Family History  Problem  Relation Age of Onset  . Stroke Mother   . Congestive Heart Failure Father      Current Outpatient Medications:  .  allopurinol (ZYLOPRIM) 100 MG tablet, Take 1 tablet (100 mg total) by mouth daily., Disp: 90 tablet, Rfl: 1 .  aspirin EC 81 MG tablet, Take 81 mg by mouth daily after breakfast. , Disp: , Rfl:  .  Calcium Carb-Cholecalciferol (CALCIUM + D3 PO), Take 1 tablet by mouth daily. , Disp: , Rfl:  .  Cholecalciferol (VITAMIN D) 2000 units tablet, Take 2,000 Units by mouth daily., Disp: , Rfl:  .  diclofenac sodium (VOLTAREN) 1 % GEL, Apply 2 g topically 4 (four) times daily. As needed, Disp: 100 g, Rfl: 1 .  gabapentin (NEURONTIN) 100 MG capsule, Take 1 capsule (100 mg total) by mouth at bedtime., Disp: 90 capsule, Rfl: 0 .  losartan (COZAAR) 50 MG tablet, One tab po qd, Disp: 90 tablet, Rfl: 2 .  metFORMIN (GLUCOPHAGE) 500 MG tablet, Take 1 tablet (500 mg total) by mouth daily after breakfast., Disp: 90 tablet, Rfl: 2 .  Multiple Vitamins-Minerals (ONE-A-DAY WOMENS 50 PLUS PO), Take 1 tablet by mouth daily., Disp: , Rfl:  .  simvastatin (ZOCOR) 20 MG tablet, TAKE 1 TABLET DAILY, Disp: 90 tablet, Rfl: 3   Allergies  Allergen Reactions  . Percocet [Oxycodone-Acetaminophen] Nausea And Vomiting  . Shellfish Allergy Other (See Comments)    Causes Gout     Review of Systems  Constitutional: Negative.   Eyes: Negative for blurred vision.  Respiratory: Negative.   Cardiovascular: Negative.  Negative for chest pain.  Gastrointestinal: Negative.   Neurological: Negative.   Psychiatric/Behavioral:  Negative.      Today's Vitals   04/23/19 1123  BP: 130/72  Pulse: 85  Temp: 98.3 F (36.8 C)  TempSrc: Oral  Weight: 215 lb (97.5 kg)  Height: 5' 6.4" (1.687 m)  PainSc: 0-No pain   Body mass index is 34.29 kg/m.   Objective:  Physical Exam Vitals signs and nursing note reviewed.  Constitutional:      Appearance: Normal appearance.  HENT:     Head: Normocephalic and  atraumatic.  Cardiovascular:     Rate and Rhythm: Normal rate and regular rhythm.     Heart sounds: Normal heart sounds.  Pulmonary:     Effort: Pulmonary effort is normal.     Breath sounds: Normal breath sounds.  Skin:    General: Skin is warm.  Neurological:     General: No focal deficit present.     Mental Status: She is alert.  Psychiatric:        Mood and Affect: Mood normal.        Behavior: Behavior normal.         Assessment And Plan:     1. Parenchymal renal hypertension, stage 1 through stage 4 or unspecified chronic kidney disease  Well controlled. She will continue with current meds. She is encouraged to avoid adding salt to her foods.  I will check labs as listed below.   - Lipid panel - CMP14+EGFR  2. Diabetes mellitus with stage 3 chronic kidney disease ([place])  I will check an a1c today. Importance of dietary AND exercise compliance was discussed with the patient.   - Hemoglobin A1c  3. 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   4. Class 1 obesity due to excess calories with serious comorbidity and body mass index (BMI) of 34.0 to 34.9 in adult  She is encouraged to strive for BMI less than 30 to decrease cardiac risk. Importance of achieving optimal weight to decrease risk of cardiovascular disease and cancers was discussed with the patient in full detail. She is encouraged to start slowly - start with 10 minutes twice daily at least three to four days per week and to gradually build to 30 minutes five days weekly. She was given tips to incorporate more activity into her daily routine - take stairs when possible, park farther away from her job, grocery stores, etc.    [name], MD    THE PATIENT IS ENCOURAGED TO PRACTICE SOCIAL DISTANCING DUE TO THE COVID-19 PANDEMIC.

## 2019-04-23 NOTE — Patient Instructions (Signed)
Exercising to Stay Healthy To become healthy and stay healthy, it is recommended that you do moderate-intensity and vigorous-intensity exercise. You can tell that you are exercising at a moderate intensity if your heart starts beating faster and you start breathing faster but can still hold a conversation. You can tell that you are exercising at a vigorous intensity if you are breathing much harder and faster and cannot hold a conversation while exercising. Exercising regularly is important. It has many health benefits, such as:  Improving overall fitness, flexibility, and endurance.  Increasing bone density.  Helping with weight control.  Decreasing body fat.  Increasing muscle strength.  Reducing stress and tension.  Improving overall health. How often should I exercise? Choose an activity that you enjoy, and set realistic goals. Your health care provider can help you make an activity plan that works for you. Exercise regularly as told by your health care provider. This may include:  Doing strength training two times a week, such as: ? Lifting weights. ? Using resistance bands. ? Push-ups. ? Sit-ups. ? Yoga.  Doing a certain intensity of exercise for a given amount of time. Choose from these options: ? A total of 150 minutes of moderate-intensity exercise every week. ? A total of 75 minutes of vigorous-intensity exercise every week. ? A mix of moderate-intensity and vigorous-intensity exercise every week. Children, pregnant women, people who have not exercised regularly, people who are overweight, and older adults may need to talk with a health care provider about what activities are safe to do. If you have a medical condition, be sure to talk with your health care provider before you start a new exercise program. What are some exercise ideas? Moderate-intensity exercise ideas include:  Walking 1 mile (1.6 km) in about 15 minutes.  Biking.  Hiking.  Golfing.  Dancing.   Water aerobics. Vigorous-intensity exercise ideas include:  Walking 4.5 miles (7.2 km) or more in about 1 hour.  Jogging or running 5 miles (8 km) in about 1 hour.  Biking 10 miles (16.1 km) or more in about 1 hour.  Lap swimming.  Roller-skating or in-line skating.  Cross-country skiing.  Vigorous competitive sports, such as football, basketball, and soccer.  Jumping rope.  Aerobic dancing. What are some everyday activities that can help me to get exercise?  Yard work, such as: ? Pushing a lawn mower. ? Raking and bagging leaves.  Washing your car.  Pushing a stroller.  Shoveling snow.  Gardening.  Washing windows or floors. How can I be more active in my day-to-day activities?  Use stairs instead of an elevator.  Take a walk during your lunch break.  If you drive, park your car farther away from your work or school.  If you take public transportation, get off one stop early and walk the rest of the way.  Stand up or walk around during all of your indoor phone calls.  Get up, stretch, and walk around every 30 minutes throughout the day.  Enjoy exercise with a friend. Support to continue exercising will help you keep a regular routine of activity. What guidelines can I follow while exercising?  Before you start a new exercise program, talk with your health care provider.  Do not exercise so much that you hurt yourself, feel dizzy, or get very short of breath.  Wear comfortable clothes and wear shoes with good support.  Drink plenty of water while you exercise to prevent dehydration or heat stroke.  Work out until your breathing   and your heartbeat get faster. Where to find more information  U.S[place] Department of Health and Human Services: www.hhs.gov  Centers for Disease Control and Prevention (CDC): www.cdc.gov Summary  Exercising regularly is important. It will improve your overall fitness, flexibility, and endurance.  Regular exercise also will  improve your overall health. It can help you control your weight, reduce stress, and improve your bone density.  Do not exercise so much that you hurt yourself, feel dizzy, or get very short of breath.  Before you start a new exercise program, talk with your health care provider. This information is not intended to replace advice given to you by your health care provider. Make sure you discuss any questions you have with your health care provider. Document Released: 11/12/2010 Document Revised: 09/22/2017 Document Reviewed: 08/31/2017 Elsevier Patient Education  2020 Elsevier Inc.  

## 2019-04-24 LAB — CMP14+EGFR
ALT: 20 IU/L (ref 0–32)
AST: 16 IU/L (ref 0–40)
Albumin/Globulin Ratio: 1.6 (ref 1.2–2.2)
Albumin: 4.2 g/dL (ref 3.7–4.7)
Alkaline Phosphatase: 88 IU/L (ref 39–117)
BUN/Creatinine Ratio: 15 (ref 12–28)
BUN: 17 mg/dL (ref 8–27)
Bilirubin Total: 0.2 mg/dL (ref 0.0–1.2)
CO2: 23 mmol/L (ref 20–29)
Calcium: 9.8 mg/dL (ref 8.7–10.3)
Chloride: 101 mmol/L (ref 96–106)
Creatinine, Ser: 1.17 mg/dL — ABNORMAL HIGH (ref 0.57–1.00)
GFR calc Af Amer: 54 mL/min/{1.73_m2} — ABNORMAL LOW (ref >59)
GFR calc non Af Amer: 47 mL/min/{1.73_m2} — ABNORMAL LOW (ref >59)
Globulin, Total: 2.7 g/dL (ref 1.5–4.5)
Glucose: 103 mg/dL — ABNORMAL HIGH (ref 65–99)
Potassium: 4.3 mmol/L (ref 3.5–5.2)
Sodium: 139 mmol/L (ref 134–144)
Total Protein: 6.9 g/dL (ref 6.0–8.5)

## 2019-04-24 LAB — LIPID PANEL
Chol/HDL Ratio: 5 ratio — ABNORMAL HIGH (ref 0.0–4.4)
Cholesterol, Total: 146 mg/dL (ref 100–199)
HDL: 29 mg/dL — ABNORMAL LOW (ref >39)
LDL Calculated: 77 mg/dL (ref 0–99)
Triglycerides: 198 mg/dL — ABNORMAL HIGH (ref 0–149)
VLDL Cholesterol Cal: 40 mg/dL (ref 5–40)

## 2019-04-24 LAB — HEMOGLOBIN A1C
Est. average glucose Bld gHb Est-mCnc: 154 mg/dL
Hgb A1c MFr Bld: 7 % — ABNORMAL HIGH (ref 4.8–5.6)

## 2019-04-25 ENCOUNTER — Encounter: Payer: Self-pay | Admitting: Internal Medicine

## 2019-05-01 ENCOUNTER — Telehealth: Payer: Self-pay

## 2019-05-01 NOTE — Telephone Encounter (Signed)
error 

## 2019-05-01 NOTE — Telephone Encounter (Deleted)
-----   Message from Glendale Chard, MD sent at 04/25/2019  7:09 AM EDT ----- Your triglycerides are elevated - were you fasting yesterday? Also, your HDL - good cholesterol is low. This increases your risk of heart disease. I suggest you exercise 30 minutes five days per week (minimum). I also suggest that you eat fish like salmon,sardines twice weekly. Liver and kidney fxn are stable. hba1c is stable at 7.0.

## 2019-05-10 DIAGNOSIS — Z1231 Encounter for screening mammogram for malignant neoplasm of breast: Secondary | ICD-10-CM | POA: Diagnosis not present

## 2019-05-10 DIAGNOSIS — Z853 Personal history of malignant neoplasm of breast: Secondary | ICD-10-CM | POA: Diagnosis not present

## 2019-05-10 LAB — HM MAMMOGRAPHY: HM Mammogram: NORMAL (ref 0–4)

## 2019-05-17 ENCOUNTER — Encounter: Payer: Self-pay | Admitting: Internal Medicine

## 2019-05-23 ENCOUNTER — Ambulatory Visit (INDEPENDENT_AMBULATORY_CARE_PROVIDER_SITE_OTHER): Payer: Medicare Other | Admitting: Podiatry

## 2019-05-23 ENCOUNTER — Encounter: Payer: Self-pay | Admitting: Podiatry

## 2019-05-23 ENCOUNTER — Other Ambulatory Visit: Payer: Self-pay

## 2019-05-23 VITALS — Temp 97.3°F

## 2019-05-23 DIAGNOSIS — E1149 Type 2 diabetes mellitus with other diabetic neurological complication: Secondary | ICD-10-CM

## 2019-05-23 DIAGNOSIS — M109 Gout, unspecified: Secondary | ICD-10-CM | POA: Diagnosis not present

## 2019-05-23 NOTE — Progress Notes (Signed)
Subjective: 73 year old female presents the office today for Evaluation of neuropathy.  She has been taking 100 mg of gabapentin at nighttime and this is been very helpful for her.  She gets an occasional warm sensations at nighttime but overall she is doing much better.  She denies recent injury or any changes otherwise.  She does have a history of gout she states that she had gout on her left foot couple weeks ago but this is resolved.  Denies any systemic complaints such as fevers, chills, nausea, vomiting. No acute changes since last appointment, and no other complaints at this time.   Objective: AAO x3, NAD DP/PT pulses palpable bilaterally, CRT less than 3 seconds Overall neuropathy symptoms are improving.  There is minimal swelling to left present.  There is no erythema or warmth.  She states that it was swollen and red and warm previously.  This is resolved.  There is no tenderness palpation.  No open lesions or pre-ulcerative lesions.  No pain with calf compression, swelling, warmth, erythema  Assessment: Neuropathy, gout  Plan: -All treatment options discussed with the patient including all alternatives, risks, complications.  -Plan continue the current dose of gabapentin.  Continue to monitor for side effects. -We will check a uric acid level.  She is currently on allopurinol. -Patient encouraged to call the office with any questions, concerns, change in symptoms.   Trula Slade DPM

## 2019-05-24 LAB — URIC ACID: Uric Acid, Serum: 6.2 mg/dL (ref 2.5–7.0)

## 2019-05-27 ENCOUNTER — Telehealth: Payer: Self-pay | Admitting: *Deleted

## 2019-05-27 NOTE — Telephone Encounter (Signed)
-----   Message from Trula Slade, DPM sent at 05/27/2019  7:15 AM EDT ----- Val- please let her know that her uric acid level is stable. Will monitor for any flare ups. If she should experience any to let us know.

## 2019-05-27 NOTE — Telephone Encounter (Signed)
I informed pt of Dr. Wagoner's review of results. 

## 2019-07-02 ENCOUNTER — Ambulatory Visit (INDEPENDENT_AMBULATORY_CARE_PROVIDER_SITE_OTHER): Payer: Medicare Other | Admitting: Podiatry

## 2019-07-02 ENCOUNTER — Other Ambulatory Visit: Payer: Self-pay

## 2019-07-02 DIAGNOSIS — M109 Gout, unspecified: Secondary | ICD-10-CM | POA: Diagnosis not present

## 2019-07-02 DIAGNOSIS — M79675 Pain in left toe(s): Secondary | ICD-10-CM

## 2019-07-02 DIAGNOSIS — B351 Tinea unguium: Secondary | ICD-10-CM

## 2019-07-02 DIAGNOSIS — E1149 Type 2 diabetes mellitus with other diabetic neurological complication: Secondary | ICD-10-CM

## 2019-07-02 DIAGNOSIS — M79674 Pain in right toe(s): Secondary | ICD-10-CM | POA: Diagnosis not present

## 2019-07-02 MED ORDER — GABAPENTIN 100 MG PO CAPS: 100.0000 mg | ORAL_CAPSULE | Freq: Every day | ORAL | 0 refills | Status: DC

## 2019-07-02 MED ORDER — DICLOFENAC SODIUM 1 % TD GEL: 2.0000 g | Freq: Four times a day (QID) | TRANSDERMAL | 1 refills | Status: DC

## 2019-07-14 NOTE — Progress Notes (Signed)
Subjective: 73 y.o. returns the office today for painful, elongated, thickened toenails which she cannot trim herself. Denies any redness or drainage around the nails.  She also presents today for further evaluation neuropathy.  She states that overall she is about 80% better she is asking for a refill.  she does feel that this is been helpful with the gabapentin.  she also states that she had a gout flare over the weekend.  she said her symptoms have improved but she wanted me to just check her foot.  she is currently on allopurinol.  Denies any acute changes since last appointment and no new complaints today. Denies any systemic complaints such as fevers, chills, nausea, vomiting.   PCP: Glendale Chard, MD  Objective: AAO 3, NAD DP/PT pulses palpable, CRT less than 3 seconds Nails hypertrophic, dystrophic, elongated, brittle, discolored 10. There is tenderness overlying the nails 1-5 bilaterally. There is no surrounding erythema or drainage along the nail sites. No open lesions or pre-ulcerative lesions are identified. No evidence of acute gout flare identified today other than minimal swelling to the MPJ today but she states that overall she is doing much better. No other areas of tenderness bilateral lower extremities. No overlying edema, erythema, increased warmth. No pain with calf compression, swelling, warmth, erythema.  Assessment: Patient presents with symptomatic onychomycosis  Plan: -Treatment options including alternatives, risks, complications were discussed -Nails sharply debrided 10 without complication/bleeding. -Refill gabapentin spelt Voltaren gel.  If continues to get repeat current gout flares discussed increasing allopurinol dose.  Still to minimal swelling but overall much improved.  Wants to hold off on steroid injection or other medications as she is doing better. -Discussed daily foot inspection. If there are any changes, to call the office immediately.  -Follow-up  in 3 months or sooner if any problems are to arise. In the meantime, encouraged to call the office with any questions, concerns, changes symptoms.  Celesta Gentile, DPM

## 2019-07-18 ENCOUNTER — Encounter: Payer: Self-pay | Admitting: Internal Medicine

## 2019-07-18 ENCOUNTER — Ambulatory Visit (INDEPENDENT_AMBULATORY_CARE_PROVIDER_SITE_OTHER): Payer: Medicare Other

## 2019-07-18 ENCOUNTER — Ambulatory Visit: Payer: Medicare Other | Admitting: Internal Medicine

## 2019-07-18 ENCOUNTER — Other Ambulatory Visit: Payer: Self-pay

## 2019-07-18 ENCOUNTER — Telehealth: Payer: Self-pay | Admitting: Internal Medicine

## 2019-07-18 ENCOUNTER — Ambulatory Visit (INDEPENDENT_AMBULATORY_CARE_PROVIDER_SITE_OTHER): Payer: Medicare Other | Admitting: Internal Medicine

## 2019-07-18 VITALS — BP 128/76 | HR 88 | Temp 98.0°F | Ht 66.0 in | Wt 214.0 lb

## 2019-07-18 DIAGNOSIS — N183 Chronic kidney disease, stage 3 unspecified: Secondary | ICD-10-CM

## 2019-07-18 DIAGNOSIS — Z716 Tobacco abuse counseling: Secondary | ICD-10-CM

## 2019-07-18 DIAGNOSIS — M1A3790 Chronic gout due to renal impairment, unspecified ankle and foot, without tophus (tophi): Secondary | ICD-10-CM | POA: Diagnosis not present

## 2019-07-18 DIAGNOSIS — E6609 Other obesity due to excess calories: Secondary | ICD-10-CM

## 2019-07-18 DIAGNOSIS — E1122 Type 2 diabetes mellitus with diabetic chronic kidney disease: Secondary | ICD-10-CM | POA: Diagnosis not present

## 2019-07-18 DIAGNOSIS — Z6834 Body mass index (BMI) 34.0-34.9, adult: Secondary | ICD-10-CM | POA: Diagnosis not present

## 2019-07-18 DIAGNOSIS — Z0000 Encounter for general adult medical examination without abnormal findings: Secondary | ICD-10-CM

## 2019-07-18 DIAGNOSIS — I129 Hypertensive chronic kidney disease with stage 1 through stage 4 chronic kidney disease, or unspecified chronic kidney disease: Secondary | ICD-10-CM

## 2019-07-18 LAB — POCT UA - MICROALBUMIN
Albumin/Creatinine Ratio, Urine, POC: <30
Creatinine, POC: 300 mg/dL
Microalbumin Ur, POC: 10 mg/L

## 2019-07-18 MED ORDER — ALLOPURINOL 100 MG PO TABS: ORAL_TABLET | ORAL | 2 refills | Status: DC

## 2019-07-18 MED ORDER — LOSARTAN POTASSIUM 50 MG PO TABS: ORAL_TABLET | ORAL | 2 refills | Status: DC

## 2019-07-18 NOTE — Progress Notes (Signed)
Subjective:   Tammy Preston is a 73 y.o. female who presents for Medicare Annual (Subsequent) preventive examination.  Review of Systems:  n/a Cardiac Risk Factors include: advanced age (>70men, >36 women);diabetes mellitus;hypertension;obesity (BMI >30kg/m2)     Objective:     Vitals: BP 128/76 (BP Location: Right Arm, Patient Position: Sitting, Cuff Size: Large)   Pulse 88   Temp 98 F (36.7 C) (Oral)   Ht 5\' 6"  (1.676 m)   Wt 214 lb (97.1 kg)   SpO2 99%   BMI 34.54 kg/m   Body mass index is 34.54 kg/m.  Advanced Directives 07/18/2019 05/07/2017 03/27/2017  Does Patient Have a Medical Advance Directive? No No No  Would patient like information on creating a medical advance directive? No - Patient declined - Yes (MAU/Ambulatory/Procedural Areas - Information given)    Tobacco Social History   Tobacco Use  Smoking Status Current Every Day Smoker  . Packs/day: 0.25  . Years: 48.00  . Pack years: 12.00  . Types: Cigarettes  Smokeless Tobacco Never Used     Ready to quit: No Counseling given: Not Answered   Clinical Intake:  Pre-visit preparation completed: Yes  Pain : No/denies pain     Nutritional Status: BMI > 30  Obese Nutritional Risks: None Diabetes: Yes CBG done?: No Did pt. bring in CBG monitor from home?: No  How often do you need to have someone help you when you read instructions, pamphlets, or other written materials from your doctor or pharmacy?: 1 - Never What is the last grade level you completed in school?: 12th grade  Interpreter Needed?: No  Information entered by :: NAllen LPN  Past Medical History:  Diagnosis Date  . Breast cancer ([place])   . Chronic kidney disease    CKD stage 3  . Chronic kidney disease, stage 3 (moderate) ([place]) 05/20/2017  . Diabetes mellitus without complication ([place])   . Endometrial hyperplasia 05/20/2017  . 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 Hyperlipemia   . Hypertension   .  Hypertensive chronic kidney disease with stage 1 through stage 4 chronic kidney disease, or unspecified chronic kidney disease 05/20/2017  . Numbness and tingling of both feet   . 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 BACK SURGERY    . BREAST LUMPECTOMY Left   . COLONOSCOPY    . DILATATION & CURETTAGE/HYSTEROSCOPY WITH MYOSURE N/A 04/07/2017   Procedure: DILATATION & CURETTAGE/HYSTEROSCOPY WITH MYOSURE;  Surgeon: Servando Salina, MD;  Location: [facility];  Service: Gynecology;  Laterality: N/A;  . KNEE SURGERY     Family History  Problem Relation Age of Onset  . Stroke Mother   . Congestive Heart Failure Father    Social History   Socioeconomic History  . Marital status: Married    Spouse name: Not on file  . Number of children: Not on file  . Years of education: Not on file  . Highest education level: Not on file  Occupational History  . Occupation: retired  Scientific laboratory technician  . Financial resource strain: Not hard at all  . Food insecurity    Worry: Never true    Inability: Never true  . Transportation needs    Medical: No    Non-medical: No  Tobacco Use  . Smoking status: Current Every Day Smoker    Packs/day: 0.25    Years: 48.00    Pack years:  12.00    Types: Cigarettes  . Smokeless tobacco: Never Used  Substance and Sexual Activity  . Alcohol use: No    Alcohol/week: 0.0 standard drinks  . Drug use: No  . Sexual activity: Not Currently  Lifestyle  . Physical activity    Days per week: 5 days    Minutes per session: 30 min  . Stress: Not at all  Relationships  . Social Herbalist on phone: Not on file    Gets together: Not on file    Attends religious service: Not on file    Active member of club or organization: Not on file    Attends meetings of clubs or organizations: Not on file    Relationship status: Not on file  Other Topics Concern  . Not on file   Social History Narrative  . Not on file    Outpatient Encounter Medications as of 07/18/2019  Medication Sig  . allopurinol (ZYLOPRIM) 100 MG tablet Take 1 tablet (100 mg total) by mouth daily.  Marland Kitchen aspirin EC 81 MG tablet Take 81 mg by mouth daily after breakfast.   . Calcium Carb-Cholecalciferol (CALCIUM + D3 PO) Take 1 tablet by mouth daily.   . Cholecalciferol (VITAMIN D) 2000 units tablet Take 2,000 Units by mouth daily.  . diclofenac sodium (VOLTAREN) 1 % GEL Apply 2 g topically 4 (four) times daily. As needed  . gabapentin (NEURONTIN) 100 MG capsule Take 1 capsule (100 mg total) by mouth at bedtime.  Marland Kitchen losartan (COZAAR) 25 MG tablet Take 50 mg by mouth daily.  . metFORMIN (GLUCOPHAGE) 500 MG tablet Take 1 tablet (500 mg total) by mouth daily after breakfast.  . Multiple Vitamins-Minerals (ONE-A-DAY WOMENS 50 PLUS PO) Take 1 tablet by mouth daily.  . RESTASIS 0.05 % ophthalmic emulsion   . simvastatin (ZOCOR) 20 MG tablet TAKE 1 TABLET DAILY  . losartan (COZAAR) 50 MG tablet One tab po qd (Patient not taking: Reported on 07/18/2019)   No facility-administered encounter medications on file as of 07/18/2019.     Activities of Daily Living In your present state of health, do you have any difficulty performing the following activities: 07/18/2019  Hearing? N  Vision? N  Difficulty concentrating or making decisions? N  Walking or climbing stairs? N  Dressing or bathing? N  Doing errands, shopping? N  Preparing Food and eating ? N  Using the Toilet? N  In the past six months, have you accidently leaked urine? N  Do you have problems with loss of bowel control? N  Managing your Medications? N  Managing your Finances? N  Housekeeping or managing your Housekeeping? N  Some recent data might be hidden    Patient Care Team: Glendale Chard, MD as PCP - General (Internal Medicine)    Assessment:   This is a routine wellness examination for Tammy Preston.  Exercise Activities and Dietary  recommendations Current Exercise Habits: Home exercise routine, Time (Minutes): 30, Frequency (Times/Week): 5, Weekly Exercise (Minutes/Week): 150  Goals    . Patient Stated     07/18/2019, no goals       Fall Risk Fall Risk  07/18/2019 04/23/2019 12/20/2018 09/17/2018 09/11/2018  Falls in the past year? 0 0 0 0 0  Comment - - - - Emmi Telephone Survey: data to providers prior to load  Number falls in past yr: 0 - - - -  Risk for fall due to : Medication side effect - - - -  Follow  up Falls evaluation completed;Education provided;Falls prevention discussed - - - -   Is the patient's home free of loose throw rugs in walkways, pet beds, electrical cords, etc?   yes      Grab bars in the bathroom? yes      Handrails on the stairs?   yes      Adequate lighting?   yes  Timed Get Up and Go performed: n/a  Depression Screen PHQ 2/9 Scores 07/18/2019 04/23/2019 12/20/2018 09/17/2018  PHQ - 2 Score 0 0 0 0  PHQ- 9 Score 0 - - -     Cognitive Function     6CIT Screen 07/18/2019  What Year? 0 points  What month? 0 points  What time? 0 points  Count back from 20 0 points  Months in reverse 0 points  Repeat phrase 0 points  Total Score 0    Immunization History  Administered Date(s) Administered  . DTaP 01/22/2014  . Influenza-Unspecified 07/24/2013, 07/11/2018  . Pneumococcal-Unspecified 07/10/2017  . Zoster Recombinat (Shingrix) 12/22/2017, 04/05/2018    Qualifies for Shingles Vaccine?yes  Screening Tests Health Maintenance  Topic Date Due  . PNA vac Low Risk Adult (2 of 2 - PCV13) 07/10/2018  . INFLUENZA VACCINE  05/25/2019  . OPHTHALMOLOGY EXAM  08/26/2019  . HEMOGLOBIN A1C  10/23/2019  . FOOT EXAM  04/10/2020  . MAMMOGRAM  05/09/2021  . TETANUS/TDAP  01/23/2024  . COLONOSCOPY  05/02/2028  . DEXA SCAN  Completed  . Hepatitis C Screening  Completed    Cancer Screenings: Lung: Low Dose CT Chest recommended if Age 63-80 years, 30 pack-year currently smoking OR have  quit w/in 15years. Patient does not qualify. Breast:  Up to date on Mammogram? Yes   Up to date of Bone Density/Dexa? Yes Colorectal: up to date  Additional Screenings: : Hepatitis C Screening: 02/2018     Plan:    Patient has no goals set at this time.   I have personally reviewed and noted the following in the patient's chart:   . Medical and social history . Use of alcohol, tobacco or illicit drugs  . Current medications and supplements . Functional ability and status . Nutritional status . Physical activity . Advanced directives . List of other physicians . Hospitalizations, surgeries, and ER visits in previous 12 months . Vitals . Screenings to include cognitive, depression, and falls . Referrals and appointments  In addition, I have reviewed and discussed with patient certain preventive protocols, quality metrics, and best practice recommendations. A written personalized care plan for preventive services as well as general preventive health recommendations were provided to patient.     Kellie Simmering, LPN  [id number]

## 2019-07-18 NOTE — Patient Instructions (Signed)
Gout  Gout is painful swelling of your joints. Gout is a type of arthritis. It is caused by having too much uric acid in your body. Uric acid is a chemical that is made when your body breaks down substances called purines. If your body has too much uric acid, sharp crystals can form and build up in your joints. This causes pain and swelling. Gout attacks can happen quickly and be very painful (acute gout). Over time, the attacks can affect more joints and happen more often (chronic gout). What are the causes?  Too much uric acid in your blood. This can happen because: ? Your kidneys do not remove enough uric acid from your blood. ? Your body makes too much uric acid. ? You eat too many foods that are high in purines. These foods include organ meats, some seafood, and beer.  Trauma or stress. What increases the risk?  Having a family history of gout.  Being female and middle-aged.  Being female and having gone through menopause.  Being very overweight (obese).  Drinking alcohol, especially beer.  Not having enough water in the body (being dehydrated).  Losing weight too quickly.  Having an organ transplant.  Having lead poisoning.  Taking certain medicines.  Having kidney disease.  Having a skin condition called psoriasis. What are the signs or symptoms? An attack of acute gout usually happens in just one joint. The most common place is the big toe. Attacks often start at night. Other joints that may be affected include joints of the feet, ankle, knee, fingers, wrist, or elbow. Symptoms of an attack may include:  Very bad pain.  Warmth.  Swelling.  Stiffness.  Shiny, red, or purple skin.  Tenderness. The affected joint may be very painful to touch.  Chills and fever. Chronic gout may cause symptoms more often. More joints may be involved. You may also have white or yellow lumps (tophi) on your hands or feet or in other areas near your joints. How is this  treated?  Treatment for this condition has two phases: treating an acute attack and preventing future attacks.  Acute gout treatment may include: ? NSAIDs. ? Steroids. These are taken by mouth or injected into a joint. ? Colchicine. This medicine relieves pain and swelling. It can be given by mouth or through an IV tube.  Preventive treatment may include: ? Taking small doses of NSAIDs or colchicine daily. ? Using a medicine that reduces uric acid levels in your blood. ? Making changes to your diet. You may need to see a food expert (dietitian) about what to eat and drink to prevent gout. Follow these instructions at home: During a gout attack   If told, put ice on the painful area: ? Put ice in a plastic bag. ? Place a towel between your skin and the bag. ? Leave the ice on for 20 minutes, 2-3 times a day.  Raise (elevate) the painful joint above the level of your heart as often as you can.  Rest the joint as much as possible. If the joint is in your leg, you may be given crutches.  Follow instructions from your doctor about what you cannot eat or drink. Avoiding future gout attacks  Eat a low-purine diet. Avoid foods and drinks such as: ? Liver. ? Kidney. ? Anchovies. ? Asparagus. ? Herring. ? Mushrooms. ? Mussels. ? Beer.  Stay at a healthy weight. If you want to lose weight, talk with your doctor. Do not lose weight   too fast.  Start or continue an exercise plan as told by your doctor. Eating and drinking  Drink enough fluids to keep your pee (urine) pale yellow.  If you drink alcohol: ? Limit how much you use to:  0-1 drink a day for women.  0-2 drinks a day for men. ? Be aware of how much alcohol is in your drink. In the [place] one drink equals one 12 oz bottle of beer (355 mL), one 5 oz glass of wine (148 mL), or one 1 oz glass of hard liquor (44 mL). General instructions  Take over-the-counter and prescription medicines only as told by your doctor.  Do  not drive or use heavy machinery while taking prescription pain medicine.  Return to your normal activities as told by your doctor. Ask your doctor what activities are safe for you.  Keep all follow-up visits as told by your doctor. This is important. Contact a doctor if:  You have another gout attack.  You still have symptoms of a gout attack after 10 days of treatment.  You have problems (side effects) because of your medicines.  You have chills or a fever.  You have burning pain when you pee (urinate).  You have pain in your lower back or belly. Get help right away if:  You have very bad pain.  Your pain cannot be controlled.  You cannot pee. Summary  Gout is painful swelling of the joints.  The most common site of pain is the big toe, but it can affect other joints.  Medicines and avoiding some foods can help to prevent and treat gout attacks. This information is not intended to replace advice given to you by your health care provider. Make sure you discuss any questions you have with your health care provider. Document Released: 07/19/2008 Document Revised: 05/02/2018 Document Reviewed: 05/02/2018 Elsevier Patient Education  2020 Elsevier Inc.  

## 2019-07-18 NOTE — Chronic Care Management (AMB) (Signed)
°  Chronic Care Management   Outreach Note  07/18/2019 Name: Tammy Preston MRN: [id number] DOB: 1946/02/26  Referred by: Glendale Chard, MD Reason for referral : Chronic Care Management (Initial CCM outreach was unsuccessful. )   An unsuccessful telephone outreach was attempted today. The patient was referred to the case management team by for assistance with chronic care management and care coordination.   Follow Up Plan: A HIPPA compliant phone message was left for the patient providing contact information and requesting a return call.  The care management team will reach out to the patient again over the next 7 days.  If patient returns call to provider office, please advise to call [place] [facility] [place]  ??[email or web address]   ??[phone]

## 2019-07-18 NOTE — Progress Notes (Signed)
Subjective:     Patient ID: Tammy Preston , female    DOB: [birth year] , 73 y.o.   MRN: [id number]   Chief Complaint  Patient presents with  . Diabetes  . Hypertension    HPI  Diabetes She presents for her follow-up diabetic visit. She has type 2 diabetes mellitus. Her disease course has been stable. Pertinent negatives for diabetes include no chest pain, no fatigue, no foot paresthesias, no polydipsia, no polyphagia and no polyuria. There are no hypoglycemic complications. There are no diabetic complications. Risk factors for coronary artery disease include diabetes mellitus, dyslipidemia, hypertension, post-menopausal and sedentary lifestyle. She is compliant with treatment all of the time. She is following a diabetic diet. She participates in exercise intermittently. Her breakfast blood glucose is taken between 8-9 am. Her breakfast blood glucose range is generally 110-130 mg/dl.  Hypertension This is a chronic problem. The current episode started more than 1 year ago. The problem is controlled. Pertinent negatives include no chest pain, neck pain, orthopnea or palpitations. Risk factors for coronary artery disease include diabetes mellitus. The current treatment provides moderate improvement.     Past Medical History:  Diagnosis Date  . Breast cancer ([place])   . Chronic kidney disease    CKD stage 3  . Chronic kidney disease, stage 3 (moderate) ([place]) 05/20/2017  . Diabetes mellitus without complication ([place])   . Endometrial hyperplasia 05/20/2017  . 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 Hyperlipemia   . Hypertension   . Hypertensive chronic kidney disease with stage 1 through stage 4 chronic kidney disease, or unspecified chronic kidney disease 05/20/2017  . Numbness and tingling of both feet   . Type 2 diabetes mellitus with diabetic chronic kidney disease ([place]) 05/20/2017     Family History  Problem Relation Age of Onset  . Stroke Mother   .  Congestive Heart Failure Father      Current Outpatient Medications:  .  allopurinol (ZYLOPRIM) 100 MG tablet, Take 1 tablet (100 mg total) by mouth daily., Disp: 90 tablet, Rfl: 1 .  aspirin EC 81 MG tablet, Take 81 mg by mouth daily after breakfast. , Disp: , Rfl:  .  Calcium Carb-Cholecalciferol (CALCIUM + D3 PO), Take 1 tablet by mouth daily. , Disp: , Rfl:  .  Cholecalciferol (VITAMIN D) 2000 units tablet, Take 2,000 Units by mouth daily., Disp: , Rfl:  .  diclofenac sodium (VOLTAREN) 1 % GEL, Apply 2 g topically 4 (four) times daily. As needed, Disp: 100 g, Rfl: 1 .  gabapentin (NEURONTIN) 100 MG capsule, Take 1 capsule (100 mg total) by mouth at bedtime., Disp: 90 capsule, Rfl: 0 .  losartan (COZAAR) 50 MG tablet, One tab po qd, Disp: 90 tablet, Rfl: 2 .  metFORMIN (GLUCOPHAGE) 500 MG tablet, Take 1 tablet (500 mg total) by mouth daily after breakfast., Disp: 90 tablet, Rfl: 2 .  Multiple Vitamins-Minerals (ONE-A-DAY WOMENS 50 PLUS PO), Take 1 tablet by mouth daily., Disp: , Rfl:  .  RESTASIS 0.05 % ophthalmic emulsion, , Disp: , Rfl:  .  simvastatin (ZOCOR) 20 MG tablet, TAKE 1 TABLET DAILY, Disp: 90 tablet, Rfl: 3   Allergies  Allergen Reactions  . Percocet [Oxycodone-Acetaminophen] Nausea And Vomiting  . Shellfish Allergy Other (See Comments)    Causes Gout     Review of Systems  Constitutional: Negative.  Negative for fatigue.  Respiratory: Negative.   Cardiovascular: Negative.  Negative for chest pain, palpitations and orthopnea.  Gastrointestinal:  Negative.   Endocrine: Negative for polydipsia, polyphagia and polyuria.  Musculoskeletal: Negative for neck pain.  Neurological: Negative.   Psychiatric/Behavioral: Negative.      Today's Vitals   07/18/19 0937  BP: 128/76  Pulse: 88  Temp: 98 F (36.7 C)  TempSrc: Oral  SpO2: 99%  Weight: 214 lb (97.1 kg)  Height: _0  (1.676 m)   Body mass index is 34.54 kg/m.   Objective:  Physical Exam Vitals signs and  nursing note reviewed.  Constitutional:      Appearance: Normal appearance.  HENT:     Head: Normocephalic and atraumatic.  Cardiovascular:     Rate and Rhythm: Normal rate and regular rhythm.     Heart sounds: Normal heart sounds.  Pulmonary:     Effort: Pulmonary effort is normal.     Breath sounds: Normal breath sounds.  Skin:    General: Skin is warm.  Neurological:     General: No focal deficit present.     Mental Status: She is alert.  Psychiatric:        Mood and Affect: Mood normal.        Behavior: Behavior normal.         Assessment And Plan:     1. Diabetes mellitus with stage 3 chronic kidney disease ([place])  I will check labs as listed below. She was commended on incorporating cycling into her exercise regimen.   - BMP8+EGFR - Hemoglobin A1c  2. Parenchymal renal hypertension, stage 1 through stage 4 or unspecified chronic kidney disease  Chronic, well controlled. She will continue with current meds.   3. 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 She will rto in four weeks for re-evaluation.   4. Tobacco abuse counseling  Smoking cessation instruction/counseling given:  counseled patient on the dangers of tobacco use, advised patient to stop smoking, and reviewed strategies to maximize success  5. Class 1 obesity due to excess calories without serious comorbidity with body mass index (BMI) of 34.0 to 34.9 in adult  Importance of achieving optimal weight to decrease risk of cardiovascular disease and cancers was discussed with the patient in full detail. Importance of regular exercise was discussed with the patient.  She is encouraged to start slowly - start with 10 minutes twice daily at least three to four days per week and to gradually build to 30 minutes five days weekly. She was given tips to incorporate more activity into her daily routine - take stairs when  possible, park farther away from her job, grocery stores, etc.    RMaximino Greenland MD    THE PATIENT IS ENCOURAGED TO PRACTICE SOCIAL DISTANCING DUE TO THE COVID-19 PANDEMIC.

## 2019-07-18 NOTE — Patient Instructions (Signed)
[name] , Thank you for taking time to come for your Medicare Wellness Visit. I appreciate your ongoing commitment to your health goals. Please review the following plan we discussed and let me know if I can assist you in the future.   Screening recommendations/referrals: Colonoscopy: 04/2018 Mammogram: 04/2019 Bone Density: 01/2018 Recommended yearly ophthalmology/optometry visit for glaucoma screening and checkup Recommended yearly dental visit for hygiene and checkup  Vaccinations: Influenza vaccine: 06/2018 Pneumococcal vaccine: 06/2018 Tdap vaccine: 01/2014 Shingles vaccine: completed    Advanced directives: Advance directive discussed with you today. Even though you declined this today please call our office should you change your mind and we can give you the proper paperwork for you to fill out.   Conditions/risks identified: obesity  Next appointment: 07/21/2020 at 11:00   Preventive Care 72 Years and Older, Female Preventive care refers to lifestyle choices and visits with your health care provider that can promote health and wellness. What does preventive care include?  A yearly physical exam. This is also called an annual well check.  Dental exams once or twice a year.  Routine eye exams. Ask your health care provider how often you should have your eyes checked.  Personal lifestyle choices, including:  Daily care of your teeth and gums.  Regular physical activity.  Eating a healthy diet.  Avoiding tobacco and drug use.  Limiting alcohol use.  Practicing safe sex.  Taking low-dose aspirin every day.  Taking vitamin and mineral supplements as recommended by your health care provider. What happens during an annual well check? The services and screenings done by your health care provider during your annual well check will depend on your age, overall health, lifestyle risk factors, and family history of disease. Counseling  Your health care provider may ask you  questions about your:  Alcohol use.  Tobacco use.  Drug use.  Emotional well-being.  Home and relationship well-being.  Sexual activity.  Eating habits.  History of falls.  Memory and ability to understand (cognition).  Work and work Statistician.  Reproductive health. Screening  You may have the following tests or measurements:  Height, weight, and BMI.  Blood pressure.  Lipid and cholesterol levels. These may be checked every 5 years, or more frequently if you are over 72 years old.  Skin check.  Lung cancer screening. You may have this screening every year starting at age 72 if you have a 30-pack-year history of smoking and currently smoke or have quit within the past 15 years.  Fecal occult blood test (FOBT) of the stool. You may have this test every year starting at age 72.  Flexible sigmoidoscopy or colonoscopy. You may have a sigmoidoscopy every 5 years or a colonoscopy every 10 years starting at age 72.  Hepatitis C blood test.  Hepatitis B blood test.  Sexually transmitted disease (STD) testing.  Diabetes screening. This is done by checking your blood sugar (glucose) after you have not eaten for a while (fasting). You may have this done every 1-3 years.  Bone density scan. This is done to screen for osteoporosis. You may have this done starting at age 72.  Mammogram. This may be done every 1-2 years. Talk to your health care provider about how often you should have regular mammograms. Talk with your health care provider about your test results, treatment options, and if necessary, the need for more tests. Vaccines  Your health care provider may recommend certain vaccines, such as:  Influenza vaccine. This is recommended every  year.  Tetanus, diphtheria, and acellular pertussis (Tdap, Td) vaccine. You may need a Td booster every 10 years.  Zoster vaccine. You may need this after age 72.  Pneumococcal 13-valent conjugate (PCV13) vaccine. One dose is  recommended after age 72.  Pneumococcal polysaccharide (PPSV23) vaccine. One dose is recommended after age 72. Talk to your health care provider about which screenings and vaccines you need and how often you need them. This information is not intended to replace advice given to you by your health care provider. Make sure you discuss any questions you have with your health care provider. Document Released: 11/06/2015 Document Revised: 06/29/2016 Document Reviewed: 08/11/2015 Elsevier Interactive Patient Education  2017 [place] Prevention in the Home Falls can cause injuries. They can happen to people of all ages. There are many things you can do to make your home safe and to help prevent falls. What can I do on the outside of my home?  Regularly fix the edges of walkways and driveways and fix any cracks.  Remove anything that might make you trip as you walk through a door, such as a raised step or threshold.  Trim any bushes or trees on the path to your home.  Use bright outdoor lighting.  Clear any walking paths of anything that might make someone trip, such as rocks or tools.  Regularly check to see if handrails are loose or broken. Make sure that both sides of any steps have handrails.  Any raised decks and porches should have guardrails on the edges.  Have any leaves, snow, or ice cleared regularly.  Use sand or salt on walking paths during winter.  Clean up any spills in your garage right away. This includes oil or grease spills. What can I do in the bathroom?  Use night lights.  Install grab bars by the toilet and in the tub and shower. Do not use towel bars as grab bars.  Use non-skid mats or decals in the tub or shower.  If you need to sit down in the shower, use a plastic, non-slip stool.  Keep the floor dry. Clean up any water that spills on the floor as soon as it happens.  Remove soap buildup in the tub or shower regularly.  Attach bath mats  securely with double-sided non-slip rug tape.  Do not have throw rugs and other things on the floor that can make you trip. What can I do in the bedroom?  Use night lights.  Make sure that you have a light by your bed that is easy to reach.  Do not use any sheets or blankets that are too big for your bed. They should not hang down onto the floor.  Have a firm chair that has side arms. You can use this for support while you get dressed.  Do not have throw rugs and other things on the floor that can make you trip. What can I do in the kitchen?  Clean up any spills right away.  Avoid walking on wet floors.  Keep items that you use a lot in easy-to-reach places.  If you need to reach something above you, use a strong step stool that has a grab bar.  Keep electrical cords out of the way.  Do not use floor polish or wax that makes floors slippery. If you must use wax, use non-skid floor wax.  Do not have throw rugs and other things on the floor that can make you trip. What can  I do with my stairs?  Do not leave any items on the stairs.  Make sure that there are handrails on both sides of the stairs and use them. Fix handrails that are broken or loose. Make sure that handrails are as long as the stairways.  Check any carpeting to make sure that it is firmly attached to the stairs. Fix any carpet that is loose or worn.  Avoid having throw rugs at the top or bottom of the stairs. If you do have throw rugs, attach them to the floor with carpet tape.  Make sure that you have a light switch at the top of the stairs and the bottom of the stairs. If you do not have them, ask someone to add them for you. What else can I do to help prevent falls?  Wear shoes that:  Do not have high heels.  Have rubber bottoms.  Are comfortable and fit you well.  Are closed at the toe. Do not wear sandals.  If you use a stepladder:  Make sure that it is fully opened. Do not climb a closed  stepladder.  Make sure that both sides of the stepladder are locked into place.  Ask someone to hold it for you, if possible.  Clearly mark and make sure that you can see:  Any grab bars or handrails.  First and last steps.  Where the edge of each step is.  Use tools that help you move around (mobility aids) if they are needed. These include:  Canes.  Walkers.  Scooters.  Crutches.  Turn on the lights when you go into a dark area. Replace any light bulbs as soon as they burn out.  Set up your furniture so you have a clear path. Avoid moving your furniture around.  If any of your floors are uneven, fix them.  If there are any pets around you, be aware of where they are.  Review your medicines with your doctor. Some medicines can make you feel dizzy. This can increase your chance of falling. Ask your doctor what other things that you can do to help prevent falls. This information is not intended to replace advice given to you by your health care provider. Make sure you discuss any questions you have with your health care provider. Document Released: 08/06/2009 Document Revised: 03/17/2016 Document Reviewed: 11/14/2014 Elsevier Interactive Patient Education  2017 [employer].

## 2019-07-19 LAB — BMP8+EGFR
BUN/Creatinine Ratio: 15 (ref 12–28)
BUN: 17 mg/dL (ref 8–27)
CO2: 23 mmol/L (ref 20–29)
Calcium: 9.9 mg/dL (ref 8.7–10.3)
Chloride: 101 mmol/L (ref 96–106)
Creatinine, Ser: 1.13 mg/dL — ABNORMAL HIGH (ref 0.57–1.00)
GFR calc Af Amer: 56 mL/min/{1.73_m2} — ABNORMAL LOW (ref >59)
GFR calc non Af Amer: 49 mL/min/{1.73_m2} — ABNORMAL LOW (ref >59)
Glucose: 113 mg/dL — ABNORMAL HIGH (ref 65–99)
Potassium: 4.4 mmol/L (ref 3.5–5.2)
Sodium: 140 mmol/L (ref 134–144)

## 2019-07-19 LAB — HEMOGLOBIN A1C
Est. average glucose Bld gHb Est-mCnc: 148 mg/dL
Hgb A1c MFr Bld: 6.8 % — ABNORMAL HIGH (ref 4.8–5.6)

## 2019-07-22 NOTE — Chronic Care Management (AMB) (Signed)
Chronic Care Management   Note  07/22/2019 Name: Tammy Preston MRN: [id number] DOB: Mar 12, 1946  Tammy Preston is a 73 y.o. year old female who is a primary care patient of Glendale Chard, MD. I reached out to Retia Passe by phone today in response to a referral sent by [name]'s patient's health plan.     [name] was given information about Chronic Care Management services today including:  1. CCM service includes personalized support from designated clinical staff supervised by her physician, including individualized plan of care and coordination with other care providers 2. 24/7 contact phone numbers for assistance for urgent and routine care needs. 3. Service will only be billed when office clinical staff spend 20 minutes or more in a month to coordinate care. 4. Only one practitioner may furnish and bill the service in a calendar month. 5. The patient may stop CCM services at any time (effective at the end of the month) by phone call to the office staff. 6. The patient will be responsible for cost sharing (co-pay) of up to 20% of the service fee (after annual deductible is met).  Patient did not agree to enrollment in care management services and does not wish to consider at this time.  Follow up plan: The patient has been provided with contact information for the chronic care management team and has been advised to call with any health related questions or concerns.   [place]  [email or web address]   ??[phone]

## 2019-08-29 LAB — HM DIABETES EYE EXAM

## 2019-08-31 ENCOUNTER — Telehealth: Payer: Self-pay

## 2019-08-31 NOTE — Telephone Encounter (Signed)
Left vm for pt to call and reschedule appt

## 2019-09-02 ENCOUNTER — Ambulatory Visit: Payer: Medicare Other | Admitting: Internal Medicine

## 2019-09-04 ENCOUNTER — Encounter: Payer: Self-pay | Admitting: Internal Medicine

## 2019-09-04 ENCOUNTER — Ambulatory Visit (INDEPENDENT_AMBULATORY_CARE_PROVIDER_SITE_OTHER): Payer: Medicare Other | Admitting: Internal Medicine

## 2019-09-04 ENCOUNTER — Other Ambulatory Visit: Payer: Self-pay

## 2019-09-04 VITALS — BP 142/82 | HR 80 | Temp 98.3°F | Ht 66.0 in | Wt 216.6 lb

## 2019-09-04 DIAGNOSIS — Z79899 Other long term (current) drug therapy: Secondary | ICD-10-CM | POA: Diagnosis not present

## 2019-09-04 DIAGNOSIS — M1A3790 Chronic gout due to renal impairment, unspecified ankle and foot, without tophus (tophi): Secondary | ICD-10-CM

## 2019-09-04 DIAGNOSIS — Z636 Dependent relative needing care at home: Secondary | ICD-10-CM

## 2019-09-04 DIAGNOSIS — I129 Hypertensive chronic kidney disease with stage 1 through stage 4 chronic kidney disease, or unspecified chronic kidney disease: Secondary | ICD-10-CM | POA: Diagnosis not present

## 2019-09-04 DIAGNOSIS — N183 Chronic kidney disease, stage 3 unspecified: Secondary | ICD-10-CM | POA: Diagnosis not present

## 2019-09-04 NOTE — Progress Notes (Signed)
Subjective:     Patient ID: Tammy Preston , female    DOB: 07/11/1946 , 73 y.o.   MRN: [id number]   Chief Complaint  Patient presents with  . Gout    fu     HPI  She is here today for f/u gout. The dose of allopurinol was increased to 257m at her last visit. She has not had any issues with this dose of medication. She denies having any new gout symptoms since the dose change. She has no new concerns at this time.     Past Medical History:  Diagnosis Date  . Breast cancer ([place]   . Chronic kidney disease    CKD stage 3  . Chronic kidney disease, stage 3 (moderate) 05/20/2017  . Diabetes mellitus without complication ([place]   . Endometrial hyperplasia 05/20/2017  . 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 Hyperlipemia   . Hypertension   . Hypertensive chronic kidney disease with stage 1 through stage 4 chronic kidney disease, or unspecified chronic kidney disease 05/20/2017  . Numbness and tingling of both feet   . Type 2 diabetes mellitus with diabetic chronic kidney disease ([place] 05/20/2017     Family History  Problem Relation Age of Onset  . Stroke Mother   . Congestive Heart Failure Father      Current Outpatient Medications:  .  allopurinol (ZYLOPRIM) 100 MG tablet, Take 2 tabs po qd, Disp: 180 tablet, Rfl: 2 .  aspirin EC 81 MG tablet, Take 81 mg by mouth daily after breakfast. , Disp: , Rfl:  .  Calcium Carb-Cholecalciferol (CALCIUM + D3 PO), Take 1 tablet by mouth daily. , Disp: , Rfl:  .  Cholecalciferol (VITAMIN D) 2000 units tablet, Take 2,000 Units by mouth daily., Disp: , Rfl:  .  diclofenac sodium (VOLTAREN) 1 % GEL, Apply 2 g topically 4 (four) times daily. As needed, Disp: 100 g, Rfl: 1 .  gabapentin (NEURONTIN) 100 MG capsule, Take 1 capsule (100 mg total) by mouth at bedtime., Disp: 90 capsule, Rfl: 0 .  losartan (COZAAR) 50 MG tablet, One tab po qd, Disp: 90 tablet, Rfl: 2 .  metFORMIN (GLUCOPHAGE) 500 MG tablet, Take 1 tablet  (500 mg total) by mouth daily after breakfast., Disp: 90 tablet, Rfl: 2 .  Multiple Vitamins-Minerals (ONE-A-DAY WOMENS 50 PLUS PO), Take 1 tablet by mouth daily., Disp: , Rfl:  .  RESTASIS 0.05 % ophthalmic emulsion, , Disp: , Rfl:  .  simvastatin (ZOCOR) 20 MG tablet, TAKE 1 TABLET DAILY, Disp: 90 tablet, Rfl: 3   Allergies  Allergen Reactions  . Percocet [Oxycodone-Acetaminophen] Nausea And Vomiting  . Shellfish Allergy Other (See Comments)    Causes Gout     Review of Systems  Constitutional: Negative.   Respiratory: Negative.   Cardiovascular: Negative.   Gastrointestinal: Negative.   Neurological: Negative.   Psychiatric/Behavioral: Negative.        She reports she is overwhelmed with care of her ill father. She recently found out he has metastatic CA. She reports he now needs 24 hour care.      Today's Vitals   09/04/19 1418  BP: (!) 142/82  Pulse: 80  Temp: 98.3 F (36.8 C)  TempSrc: Oral  Weight: 216 lb 9.6 oz (98.2 kg)  Height: 5' 6" (1.676 m)   Body mass index is 34.96 kg/m.   Objective:  Physical Exam Vitals signs and nursing note reviewed.  Constitutional:      Appearance: Normal  appearance.  HENT:     Head: Normocephalic and atraumatic.  Cardiovascular:     Rate and Rhythm: Normal rate and regular rhythm.     Heart sounds: Normal heart sounds.  Pulmonary:     Effort: Pulmonary effort is normal.     Breath sounds: Normal breath sounds.  Skin:    General: Skin is warm.  Neurological:     General: No focal deficit present.     Mental Status: She is alert.  Psychiatric:        Mood and Affect: Mood normal.        Behavior: Behavior normal.         Assessment And Plan:     1. 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 I will also assess her renal function. She is encouraged to follow low purine diet and to stay well hydrated. I will make further recommendations  once her labs are available for review.   - Uric acid  2. Parenchymal renal hypertension, stage 1 through stage 4 or unspecified chronic kidney disease  Chronic, uncontrolled. She will continue with current meds for now. 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   3. Stage 3 chronic kidney disease, unspecified whether stage 3a or 3b CKD  Chronic, yet stable. She is encouraged to stay well hydrated.   4. Drug therapy  - BMP8+EGFR        [name], MD    THE PATIENT IS ENCOURAGED TO PRACTICE SOCIAL DISTANCING DUE TO THE COVID-19 PANDEMIC.

## 2019-09-04 NOTE — Patient Instructions (Signed)

## 2019-09-05 LAB — BMP8+EGFR
BUN/Creatinine Ratio: 14 (ref 12–28)
BUN: 16 mg/dL (ref 8–27)
CO2: 21 mmol/L (ref 20–29)
Calcium: 9.7 mg/dL (ref 8.7–10.3)
Chloride: 101 mmol/L (ref 96–106)
Creatinine, Ser: 1.17 mg/dL — ABNORMAL HIGH (ref 0.57–1.00)
GFR calc Af Amer: 54 mL/min/{1.73_m2} — ABNORMAL LOW (ref >59)
GFR calc non Af Amer: 47 mL/min/{1.73_m2} — ABNORMAL LOW (ref >59)
Glucose: 83 mg/dL (ref 65–99)
Potassium: 4.4 mmol/L (ref 3.5–5.2)
Sodium: 138 mmol/L (ref 134–144)

## 2019-09-05 LAB — URIC ACID: Uric Acid: 4.9 mg/dL (ref 2.5–7.1)

## 2019-09-06 ENCOUNTER — Other Ambulatory Visit: Payer: Self-pay

## 2019-09-06 ENCOUNTER — Telehealth: Payer: Self-pay

## 2019-09-06 NOTE — Telephone Encounter (Signed)
-----   Message from Glendale Chard, MD sent at 09/05/2019  8:55 AM EST ----- Your uric acid level has improved. Your kidney fxn is stable. Continue with current dose of allopurinol. Do you need refill? If yes, send 100mg  allopurinol 2 tabs daily #180/1 refill

## 2019-09-06 NOTE — Telephone Encounter (Signed)
The patient was notified of her recent lab results and the pt said no she doesn't need any refills at this time.

## 2019-09-16 ENCOUNTER — Other Ambulatory Visit: Payer: Self-pay | Admitting: Internal Medicine

## 2019-10-01 ENCOUNTER — Encounter: Payer: Self-pay | Admitting: Podiatry

## 2019-10-01 ENCOUNTER — Ambulatory Visit (INDEPENDENT_AMBULATORY_CARE_PROVIDER_SITE_OTHER): Payer: Medicare Other | Admitting: Podiatry

## 2019-10-01 ENCOUNTER — Other Ambulatory Visit: Payer: Self-pay

## 2019-10-01 DIAGNOSIS — B351 Tinea unguium: Secondary | ICD-10-CM

## 2019-10-01 DIAGNOSIS — M79675 Pain in left toe(s): Secondary | ICD-10-CM | POA: Diagnosis not present

## 2019-10-01 DIAGNOSIS — E1149 Type 2 diabetes mellitus with other diabetic neurological complication: Secondary | ICD-10-CM | POA: Diagnosis not present

## 2019-10-01 DIAGNOSIS — M79674 Pain in right toe(s): Secondary | ICD-10-CM

## 2019-10-01 MED ORDER — GABAPENTIN 100 MG PO CAPS: 100.0000 mg | ORAL_CAPSULE | Freq: Every day | ORAL | 0 refills | Status: DC

## 2019-10-01 MED ORDER — DICLOFENAC SODIUM 1 % EX GEL: 2.0000 g | Freq: Four times a day (QID) | CUTANEOUS | 0 refills | Status: DC

## 2019-10-01 NOTE — Progress Notes (Signed)
Subjective: 73 y.o. returns the office today for painful, elongated, thickened toenails which she cannot trim herself. Denies any redness or drainage around the nails. Denies any acute changes since last appointment and no new complaints today. Denies any systemic complaints such as fevers, chills, nausea, vomiting.   Asking for a refill of gabapentin and Voltaren gel. Gout has been stable as she stopped eating seafood.   PCP: Glendale Chard, MD  Objective: AAO 3, NAD DP/PT pulses palpable, CRT less than 3 seconds Nails hypertrophic, dystrophic, elongated, brittle, discolored 10. There is tenderness overlying the nails 1-5 bilaterally. There is no surrounding erythema or drainage along the nail sites. No open lesions or pre-ulcerative lesions are identified. No other areas of tenderness bilateral lower extremities. No overlying edema, erythema, increased warmth. No pain with calf compression, swelling, warmth, erythema.  Assessment: Patient presents with symptomatic onychomycosis  Plan: -Treatment options including alternatives, risks, complications were discussed -Nails sharply debrided 10 without complication/bleeding. -Refilled gabapentin and Voltaren gel.  -Discussed daily foot inspection. If there are any changes, to call the office immediately.  -Follow-up in 3 months or sooner if any problems are to arise. In the meantime, encouraged to call the office with any questions, concerns, changes symptoms.  Celesta Gentile, DPM

## 2019-10-02 ENCOUNTER — Other Ambulatory Visit: Payer: Medicare Other

## 2019-10-02 DIAGNOSIS — E1122 Type 2 diabetes mellitus with diabetic chronic kidney disease: Secondary | ICD-10-CM | POA: Diagnosis not present

## 2019-10-02 DIAGNOSIS — N183 Chronic kidney disease, stage 3 unspecified: Secondary | ICD-10-CM | POA: Diagnosis not present

## 2019-10-02 LAB — HEMOGLOBIN A1C
Est. average glucose Bld gHb Est-mCnc: 157 mg/dL
Hgb A1c MFr Bld: 7.1 % — ABNORMAL HIGH (ref 4.8–5.6)

## 2019-11-18 DIAGNOSIS — R3121 Asymptomatic microscopic hematuria: Secondary | ICD-10-CM | POA: Diagnosis not present

## 2019-11-18 DIAGNOSIS — N183 Chronic kidney disease, stage 3 unspecified: Secondary | ICD-10-CM | POA: Diagnosis not present

## 2019-12-03 ENCOUNTER — Ambulatory Visit (INDEPENDENT_AMBULATORY_CARE_PROVIDER_SITE_OTHER): Payer: Medicare Other | Admitting: Podiatry

## 2019-12-03 ENCOUNTER — Other Ambulatory Visit: Payer: Self-pay

## 2019-12-03 DIAGNOSIS — B351 Tinea unguium: Secondary | ICD-10-CM | POA: Diagnosis not present

## 2019-12-03 DIAGNOSIS — M79675 Pain in left toe(s): Secondary | ICD-10-CM

## 2019-12-03 DIAGNOSIS — M79674 Pain in right toe(s): Secondary | ICD-10-CM

## 2019-12-03 DIAGNOSIS — E1149 Type 2 diabetes mellitus with other diabetic neurological complication: Secondary | ICD-10-CM

## 2019-12-03 DIAGNOSIS — Q828 Other specified congenital malformations of skin: Secondary | ICD-10-CM

## 2019-12-03 MED ORDER — AMMONIUM LACTATE 12 % EX CREA: TOPICAL_CREAM | CUTANEOUS | 0 refills | Status: DC | PRN

## 2019-12-03 NOTE — Progress Notes (Signed)
Subjective: 74 y.o. returns the office today for painful, elongated, thickened toenails which she cannot trim herself. Denies any redness or drainage around the nails.  Still gets some calluses the bottoms of her feet she was discussed moisturizers that she can use.  Denies any acute changes since last appointment and no new complaints today. Denies any systemic complaints such as fevers, chills, nausea, vomiting.   PCP: Glendale Chard, MD  Objective: AAO 3, NAD DP/PT pulses palpable, CRT less than 3 seconds Nails hypertrophic, dystrophic, elongated, brittle, discolored 10. There is tenderness overlying the nails 1-5 bilaterally. There is no surrounding erythema or drainage along the nail sites. Hyperkeratotic tissue submetatarsal bilaterally x2.  No ongoing ulceration, drainage or signs of infection.  Mild dry skin is present there is no skin fissures or open sores. No open lesions or pre-ulcerative lesions are identified. No other areas of tenderness bilateral lower extremities. No overlying edema, erythema, increased warmth. No pain with calf compression, swelling, warmth, erythema.  Assessment: Patient presents with symptomatic onychomycosis; hyperkeratotic lesions; dry skin  Plan: -Treatment options including alternatives, risks, complications were discussed -Nails sharply debrided 10 without complication/bleeding. -Hyperkeratotic lesion sharply debrided x2 without any complications or bleeding -Prescribed AmLactin. -Discussed daily foot inspection. If there are any changes, to call the office immediately.  -Follow-up in 3 months or sooner if any problems are to arise. In the meantime, encouraged to call the office with any questions, concerns, changes symptoms.  Celesta Gentile, DPM

## 2019-12-20 ENCOUNTER — Other Ambulatory Visit: Payer: Self-pay | Admitting: Internal Medicine

## 2020-01-01 ENCOUNTER — Other Ambulatory Visit: Payer: Self-pay | Admitting: Podiatry

## 2020-01-04 ENCOUNTER — Ambulatory Visit: Payer: Medicare Other | Attending: Internal Medicine

## 2020-01-04 DIAGNOSIS — Z23 Encounter for immunization: Secondary | ICD-10-CM

## 2020-01-04 NOTE — Progress Notes (Signed)
   Covid-19 Vaccination Clinic  Name:  Tammy Preston    MRN: [id number] DOB: 01-26-1946  01/04/2020  [name] was observed post Covid-19 immunization for 15 minutes without incident. She was provided with Vaccine Information Sheet and instruction to access the V-Safe system.   [name] was instructed to call 911 with any severe reactions post vaccine: Marland Kitchen Difficulty breathing  . Swelling of face and throat  . A fast heartbeat  . A bad rash all over body  . Dizziness and weakness   Immunizations Administered    Name Date Dose VIS Date Route   Pfizer COVID-19 Vaccine 01/04/2020 12:40 PM 0.3 mL 10/04/2019 Intramuscular   Manufacturer: [place]   Lot: [id number]   [place]: [id number]

## 2020-01-16 ENCOUNTER — Encounter: Payer: Self-pay | Admitting: Internal Medicine

## 2020-01-16 ENCOUNTER — Other Ambulatory Visit: Payer: Self-pay

## 2020-01-16 ENCOUNTER — Ambulatory Visit (INDEPENDENT_AMBULATORY_CARE_PROVIDER_SITE_OTHER): Payer: Medicare Other | Admitting: Internal Medicine

## 2020-01-16 VITALS — BP 138/76 | HR 78 | Temp 98.2°F | Ht 66.0 in | Wt 220.8 lb

## 2020-01-16 DIAGNOSIS — E1122 Type 2 diabetes mellitus with diabetic chronic kidney disease: Secondary | ICD-10-CM | POA: Diagnosis not present

## 2020-01-16 DIAGNOSIS — Z6835 Body mass index (BMI) 35.0-35.9, adult: Secondary | ICD-10-CM

## 2020-01-16 DIAGNOSIS — M1A3790 Chronic gout due to renal impairment, unspecified ankle and foot, without tophus (tophi): Secondary | ICD-10-CM | POA: Diagnosis not present

## 2020-01-16 DIAGNOSIS — I129 Hypertensive chronic kidney disease with stage 1 through stage 4 chronic kidney disease, or unspecified chronic kidney disease: Secondary | ICD-10-CM | POA: Diagnosis not present

## 2020-01-16 DIAGNOSIS — N183 Chronic kidney disease, stage 3 unspecified: Secondary | ICD-10-CM

## 2020-01-16 MED ORDER — FREESTYLE LITE TEST VI STRP: ORAL_STRIP | 2 refills | Status: DC

## 2020-01-16 MED ORDER — FREESTYLE LANCETS MISC: 2 refills | Status: DC

## 2020-01-16 NOTE — Patient Instructions (Signed)
Diabetes Mellitus and Foot Care Foot care is an important part of your health, especially when you have diabetes. Diabetes may cause you to have problems because of poor blood flow (circulation) to your feet and legs, which can cause your skin to:  Become thinner and drier.  Break more easily.  Heal more slowly.  Peel and crack. You may also have nerve damage (neuropathy) in your legs and feet, causing decreased feeling in them. This means that you may not notice minor injuries to your feet that could lead to more serious problems. Noticing and addressing any potential problems early is the best way to prevent future foot problems. How to care for your feet Foot hygiene  Wash your feet daily with warm water and mild soap. Do not use hot water. Then, pat your feet and the areas between your toes until they are completely dry. Do not soak your feet as this can dry your skin.  Trim your toenails straight across. Do not dig under them or around the cuticle. File the edges of your nails with an emery board or nail file.  Apply a moisturizing lotion or petroleum jelly to the skin on your feet and to dry, brittle toenails. Use lotion that does not contain alcohol and is unscented. Do not apply lotion between your toes. Shoes and socks  Wear clean socks or stockings every day. Make sure they are not too tight. Do not wear knee-high stockings since they may decrease blood flow to your legs.  Wear shoes that fit properly and have enough cushioning. Always look in your shoes before you put them on to be sure there are no objects inside.  To break in new shoes, wear them for just a few hours a day. This prevents injuries on your feet. Wounds, scrapes, corns, and calluses  Check your feet daily for blisters, cuts, bruises, sores, and redness. If you cannot see the bottom of your feet, use a mirror or ask someone for help.  Do not cut corns or calluses or try to remove them with medicine.  If you  find a minor scrape, cut, or break in the skin on your feet, keep it and the skin around it clean and dry. You may clean these areas with mild soap and water. Do not clean the area with peroxide, alcohol, or iodine.  If you have a wound, scrape, corn, or callus on your foot, look at it several times a day to make sure it is healing and not infected. Check for: ? Redness, swelling, or pain. ? Fluid or blood. ? Warmth. ? Pus or a bad smell. General instructions  Do not cross your legs. This may decrease blood flow to your feet.  Do not use heating pads or hot water bottles on your feet. They may burn your skin. If you have lost feeling in your feet or legs, you may not know this is happening until it is too late.  Protect your feet from hot and cold by wearing shoes, such as at the beach or on hot pavement.  Schedule a complete foot exam at least once a year (annually) or more often if you have foot problems. If you have foot problems, report any cuts, sores, or bruises to your health care provider immediately. Contact a health care provider if:  You have a medical condition that increases your risk of infection and you have any cuts, sores, or bruises on your feet.  You have an injury that is not   healing.  You have redness on your legs or feet.  You feel burning or tingling in your legs or feet.  You have pain or cramps in your legs and feet.  Your legs or feet are numb.  Your feet always feel cold.  You have pain around a toenail. Get help right away if:  You have a wound, scrape, corn, or callus on your foot and: ? You have pain, swelling, or redness that gets worse. ? You have fluid or blood coming from the wound, scrape, corn, or callus. ? Your wound, scrape, corn, or callus feels warm to the touch. ? You have pus or a bad smell coming from the wound, scrape, corn, or callus. ? You have a fever. ? You have a red line going up your leg. Summary  Check your feet every day  for cuts, sores, red spots, swelling, and blisters.  Moisturize feet and legs daily.  Wear shoes that fit properly and have enough cushioning.  If you have foot problems, report any cuts, sores, or bruises to your health care provider immediately.  Schedule a complete foot exam at least once a year (annually) or more often if you have foot problems. This information is not intended to replace advice given to you by your health care provider. Make sure you discuss any questions you have with your health care provider. Document Revised: 07/03/2019 Document Reviewed: 11/11/2016 Elsevier Patient Education  2020 Elsevier Inc.  

## 2020-01-17 NOTE — Progress Notes (Signed)
This visit occurred during the SARS-CoV-2 public health emergency.  Safety protocols were in place, including screening questions prior to the visit, additional usage of staff PPE, and extensive cleaning of exam room while observing appropriate contact time as indicated for disinfecting solutions.  Subjective:     Patient ID: Tammy Preston , female    DOB: [birth year] , 74 y.o.   MRN: [id number]   Chief Complaint  Patient presents with  . Hypertension  . Diabetes    HPI  She presents today for diabetes check.  She reports compliance with meds. She has no specific concerns or complaints at this time.   Hypertension This is a chronic problem. The current episode started more than 1 year ago. The problem is controlled. Pertinent negatives include no chest pain, neck pain, orthopnea or palpitations. Risk factors for coronary artery disease include diabetes mellitus.  Diabetes She presents for her follow-up diabetic visit. She has type 2 diabetes mellitus. Her disease course has been stable. Pertinent negatives for diabetes include no chest pain, no fatigue, no foot paresthesias, no polydipsia, no polyphagia and no polyuria. There are no hypoglycemic complications. There are no diabetic complications. Risk factors for coronary artery disease include diabetes mellitus, dyslipidemia, hypertension, post-menopausal and sedentary lifestyle. She is compliant with treatment all of the time. She is following a diabetic diet. Her breakfast blood glucose is taken between 8-9 am. Her breakfast blood glucose range is generally 110-130 mg/dl.     Past Medical History:  Diagnosis Date  . Breast cancer ([place])   . Chronic kidney disease    CKD stage 3  . Chronic kidney disease, stage 3 (moderate) 05/20/2017  . Diabetes mellitus without complication ([place])   . Endometrial hyperplasia 05/20/2017  . 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 Hyperlipemia   . Hypertension   .  Hypertensive chronic kidney disease with stage 1 through stage 4 chronic kidney disease, or unspecified chronic kidney disease 05/20/2017  . Numbness and tingling of both feet   . Type 2 diabetes mellitus with diabetic chronic kidney disease ([place]) 05/20/2017     Family History  Problem Relation Age of Onset  . Stroke Mother   . Congestive Heart Failure Father      Current Outpatient Medications:  .  allopurinol (ZYLOPRIM) 100 MG tablet, Take 2 tabs po qd, Disp: 180 tablet, Rfl: 2 .  ammonium lactate (AMLACTIN) 12 % cream, Apply topically as needed for dry skin., Disp: 385 g, Rfl: 0 .  aspirin EC 81 MG tablet, Take 81 mg by mouth daily after breakfast. , Disp: , Rfl:  .  Calcium Carb-Cholecalciferol (CALCIUM + D3 PO), Take 1 tablet by mouth daily. , Disp: , Rfl:  .  Cholecalciferol (VITAMIN D) 2000 units tablet, Take 2,000 Units by mouth daily., Disp: , Rfl:  .  gabapentin (NEURONTIN) 100 MG capsule, Take 1 capsule by mouth once daily at bedtime, Disp: 90 capsule, Rfl: 0 .  losartan (COZAAR) 50 MG tablet, One tab po qd, Disp: 90 tablet, Rfl: 2 .  metFORMIN (GLUCOPHAGE) 500 MG tablet, TAKE 1 TABLET BY MOUTH ONCE DAILY AFTER BREAKFAST, Disp: 90 tablet, Rfl: 0 .  Multiple Vitamins-Minerals (ONE-A-DAY WOMENS 50 PLUS PO), Take 1 tablet by mouth daily., Disp: , Rfl:  .  RESTASIS 0.05 % ophthalmic emulsion, , Disp: , Rfl:  .  simvastatin (ZOCOR) 20 MG tablet, TAKE 1 TABLET DAILY, Disp: 90 tablet, Rfl: 3 .  diclofenac Sodium (VOLTAREN) 1 % GEL, Apply 2  g topically 4 (four) times daily. Rub into affected area of foot 2 to 4 times daily (Patient not taking: Reported on 01/16/2020), Disp: 100 g, Rfl: 0 .  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  Lancets (FREESTYLE) lancets, Use as instructed to check blood sugars 2 times per day dx: e11.22, Disp: 300 each, Rfl: 2   Allergies  Allergen Reactions  . Percocet  [Oxycodone-Acetaminophen] Nausea And Vomiting  . Shellfish Allergy Other (See Comments)    Causes Gout     Review of Systems  Constitutional: Negative.  Negative for fatigue.  HENT: Negative.   Respiratory: Negative.   Cardiovascular: Negative.  Negative for chest pain, palpitations and orthopnea.  Gastrointestinal: Negative.   Endocrine: Negative for polydipsia, polyphagia and polyuria.  Musculoskeletal: Negative for neck pain.  Neurological: Negative.   Psychiatric/Behavioral: Negative.      Today's Vitals   01/16/20 0909  BP: 138/76  Pulse: 78  Temp: 98.2 F (36.8 C)  TempSrc: Oral  Weight: 220 lb 12.8 oz (100.2 kg)  Height: 5' 6"  (1.676 m)  PainSc: 0-No pain   Body mass index is 35.64 kg/m.   Objective:  Physical Exam Vitals and nursing note reviewed.  Constitutional:      Appearance: Normal appearance.  HENT:     Head: Normocephalic and atraumatic.  Cardiovascular:     Rate and Rhythm: Normal rate and regular rhythm.     Heart sounds: Normal heart sounds.  Pulmonary:     Effort: Pulmonary effort is normal.     Breath sounds: Normal breath sounds.  Skin:    General: Skin is warm.  Neurological:     General: No focal deficit present.     Mental Status: She is alert.  Psychiatric:        Mood and Affect: Mood normal.        Behavior: Behavior normal.         Assessment And Plan:     1. Parenchymal renal hypertension, stage 1 through stage 4 or unspecified chronic kidney disease  Chronic,fair control. She is aware optimal BP is less than 130/80. Importance of regular exercise was discussed with the patient. Encouraged to avoid adding salt to her foods.  Will continue with current meds for now.   - CMP14+EGFR - Lipid panel  2. Stage 3 chronic kidney disease, unspecified whether stage 3a or 3b CKD  Chronic, yet stable. Importance of optimal BP/BS control is needed to prevent further progression of CKD.   - Protein electrophoresis, serum -  Phosphorus  3. Diabetes mellitus with stage 3 chronic kidney disease (HCC)  Chronic, I will check labs as listed below. I will adjust meds as needed, once her labs are available for review.   - CMP14+EGFR - Hemoglobin A1c - Lipid panel  4. 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 I will check a uric acid level today.   - Uric acid  5. Class 2 severe obesity due to excess calories with serious comorbidity and body mass index (BMI) of 35.0 to 35.9 in adult [facility])  She is encouraged to strive for BMI less than 30 to decrease cardiac risk. She is encouraged to exercise at least 30 minutes five days per week.  [name], MD    THE PATIENT IS ENCOURAGED TO PRACTICE SOCIAL DISTANCING DUE TO THE COVID-19 PANDEMIC.

## 2020-01-20 LAB — CMP14+EGFR
ALT: 25 IU/L (ref 0–32)
AST: 17 IU/L (ref 0–40)
Albumin/Globulin Ratio: 1.5 (ref 1.2–2.2)
Albumin: 4.1 g/dL (ref 3.7–4.7)
Alkaline Phosphatase: 94 IU/L (ref 39–117)
BUN/Creatinine Ratio: 16 (ref 12–28)
BUN: 18 mg/dL (ref 8–27)
Bilirubin Total: 0.4 mg/dL (ref 0.0–1.2)
CO2: 23 mmol/L (ref 20–29)
Calcium: 10 mg/dL (ref 8.7–10.3)
Chloride: 103 mmol/L (ref 96–106)
Creatinine, Ser: 1.1 mg/dL — ABNORMAL HIGH (ref 0.57–1.00)
GFR calc Af Amer: 58 mL/min/{1.73_m2} — ABNORMAL LOW (ref >59)
GFR calc non Af Amer: 50 mL/min/{1.73_m2} — ABNORMAL LOW (ref >59)
Globulin, Total: 2.7 g/dL (ref 1.5–4.5)
Glucose: 115 mg/dL — ABNORMAL HIGH (ref 65–99)
Potassium: 4.1 mmol/L (ref 3.5–5.2)
Sodium: 140 mmol/L (ref 134–144)
Total Protein: 6.8 g/dL (ref 6.0–8.5)

## 2020-01-20 LAB — PROTEIN ELECTROPHORESIS, SERUM
A/G Ratio: 1.1 (ref 0.7–1.7)
Albumin ELP: 3.6 g/dL (ref 2.9–4.4)
Alpha 1: 0.2 g/dL (ref 0.0–0.4)
Alpha 2: 0.8 g/dL (ref 0.4–1.0)
Beta: 1.1 g/dL (ref 0.7–1.3)
Gamma Globulin: 1.1 g/dL (ref 0.4–1.8)
Globulin, Total: 3.2 g/dL (ref 2.2–3.9)

## 2020-01-20 LAB — PHOSPHORUS: Phosphorus: 3.7 mg/dL (ref 3.0–4.3)

## 2020-01-20 LAB — HEMOGLOBIN A1C
Est. average glucose Bld gHb Est-mCnc: 154 mg/dL
Hgb A1c MFr Bld: 7 % — ABNORMAL HIGH (ref 4.8–5.6)

## 2020-01-20 LAB — LIPID PANEL
Chol/HDL Ratio: 4.2 ratio (ref 0.0–4.4)
Cholesterol, Total: 140 mg/dL (ref 100–199)
HDL: 33 mg/dL — ABNORMAL LOW (ref >39)
LDL Chol Calc (NIH): 86 mg/dL (ref 0–99)
Triglycerides: 114 mg/dL (ref 0–149)
VLDL Cholesterol Cal: 21 mg/dL (ref 5–40)

## 2020-01-20 LAB — URIC ACID: Uric Acid: 4.7 mg/dL (ref 3.1–7.9)

## 2020-01-28 ENCOUNTER — Ambulatory Visit: Payer: Medicare Other | Attending: Internal Medicine

## 2020-01-28 DIAGNOSIS — Z23 Encounter for immunization: Secondary | ICD-10-CM

## 2020-01-28 NOTE — Progress Notes (Signed)
   Covid-19 Vaccination Clinic  Name:  Tammy Preston    MRN: [id number] DOB: Jul 06, 1946  01/28/2020  [name] was observed post Covid-19 immunization for 15 minutes without incident. She was provided with Vaccine Information Sheet and instruction to access the V-Safe system.   Ms. Stair was instructed to call 911 with any severe reactions post vaccine: Marland Kitchen Difficulty breathing  . Swelling of face and throat  . A fast heartbeat  . A bad rash all over body  . Dizziness and weakness   Immunizations Administered    Name Date Dose VIS Date Route   Pfizer COVID-19 Vaccine 01/28/2020 11:41 AM 0.3 mL 10/04/2019 Intramuscular   Manufacturer: [place]   Lot: [id number]   [place]: [id number]

## 2020-02-04 ENCOUNTER — Encounter: Payer: Self-pay | Admitting: Podiatry

## 2020-02-04 ENCOUNTER — Ambulatory Visit (INDEPENDENT_AMBULATORY_CARE_PROVIDER_SITE_OTHER): Payer: Medicare Other | Admitting: Podiatry

## 2020-02-04 ENCOUNTER — Other Ambulatory Visit: Payer: Self-pay

## 2020-02-04 VITALS — Temp 96.1°F | Resp 14

## 2020-02-04 DIAGNOSIS — B351 Tinea unguium: Secondary | ICD-10-CM | POA: Diagnosis not present

## 2020-02-04 DIAGNOSIS — M79675 Pain in left toe(s): Secondary | ICD-10-CM | POA: Diagnosis not present

## 2020-02-04 DIAGNOSIS — Q828 Other specified congenital malformations of skin: Secondary | ICD-10-CM

## 2020-02-04 DIAGNOSIS — E1149 Type 2 diabetes mellitus with other diabetic neurological complication: Secondary | ICD-10-CM

## 2020-02-04 DIAGNOSIS — M79674 Pain in right toe(s): Secondary | ICD-10-CM

## 2020-02-04 NOTE — Progress Notes (Signed)
Subjective: 74 y.o. returns the office today for painful, elongated, thickened toenails which she cannot trim herself and for concerns of calluses. Denies any redness or drainage around the nails/calluses. Denies any acute changes since last appointment and no new complaints today. Denies any systemic complaints such as fevers, chills, nausea, vomiting.   PCP: Glendale Chard, MD  Objective: AAO 3, NAD DP/PT pulses palpable, CRT less than 3 seconds Nails hypertrophic, dystrophic, elongated, brittle, discolored 10. There is tenderness overlying the nails 1-5 bilaterally. There is no surrounding erythema or drainage along the nail sites. Hyperkeratotic tissue submetatarsal bilaterally x2.  No ongoing ulceration, drainage or signs of infection.  No open lesions or pre-ulcerative lesions are identified. No other areas of tenderness bilateral lower extremities. No overlying edema, erythema, increased warmth. No pain with calf compression, swelling, warmth, erythema.  Assessment: Patient presents with symptomatic onychomycosis; hyperkeratotic lesions  Plan: -Treatment options including alternatives, risks, complications were discussed -Nails sharply debrided 10 without complication/bleeding. -Hyperkeratotic lesion sharply debrided x2 without any complications or bleeding -Discussed daily foot inspection. If there are any changes, to call the office immediately.  -Follow-up in 3 months or sooner if any problems are to arise. In the meantime, encouraged to call the office with any questions, concerns, changes symptoms.  Celesta Gentile, DPM

## 2020-03-07 ENCOUNTER — Other Ambulatory Visit: Payer: Self-pay | Admitting: Internal Medicine

## 2020-03-22 ENCOUNTER — Other Ambulatory Visit: Payer: Self-pay | Admitting: Internal Medicine

## 2020-04-03 ENCOUNTER — Other Ambulatory Visit: Payer: Self-pay | Admitting: Podiatry

## 2020-04-07 ENCOUNTER — Ambulatory Visit (INDEPENDENT_AMBULATORY_CARE_PROVIDER_SITE_OTHER): Payer: Medicare Other | Admitting: Podiatry

## 2020-04-07 ENCOUNTER — Other Ambulatory Visit: Payer: Self-pay

## 2020-04-07 ENCOUNTER — Encounter: Payer: Self-pay | Admitting: Podiatry

## 2020-04-07 DIAGNOSIS — M79675 Pain in left toe(s): Secondary | ICD-10-CM

## 2020-04-07 DIAGNOSIS — B351 Tinea unguium: Secondary | ICD-10-CM | POA: Diagnosis not present

## 2020-04-07 DIAGNOSIS — E1149 Type 2 diabetes mellitus with other diabetic neurological complication: Secondary | ICD-10-CM

## 2020-04-07 DIAGNOSIS — M79674 Pain in right toe(s): Secondary | ICD-10-CM

## 2020-04-07 DIAGNOSIS — Q828 Other specified congenital malformations of skin: Secondary | ICD-10-CM | POA: Diagnosis not present

## 2020-04-13 NOTE — Progress Notes (Signed)
Subjective: 74 y.o. returns the office today for painful, elongated, thickened toenails which she cannot trim herself and for painful calluses. Denies any redness or drainage around the nails/calluses or any open lesions. Denies any acute changes since last appointment and no new complaints today. Denies any systemic complaints such as fevers, chills, nausea, vomiting.   PCP: Glendale Chard, MD  Objective: AAO 3, NAD DP/PT pulses palpable, CRT less than 3 seconds Nails hypertrophic, dystrophic, elongated, brittle, discolored 10. There is tenderness overlying the nails 1-5 bilaterally. There is no surrounding erythema or drainage along the nail sites. Hyperkeratotic tissue submetatarsal bilaterally x2.  No ongoing ulceration, drainage or signs of infection.  No pain with calf compression, swelling, warmth, erythema.  Assessment: Patient presents with symptomatic onychomycosis; hyperkeratotic lesions  Plan: -Treatment options including alternatives, risks, complications were discussed -Nails sharply debrided 10 without complication/bleeding. -Hyperkeratotic lesion sharply debrided x2 without any complications or bleeding -Discussed daily foot inspection. If there are any changes, to call the office immediately.  -Follow-up in 3 months or sooner if any problems are to arise. In the meantime, encouraged to call the office with any questions, concerns, changes symptoms.  Celesta Gentile, DPM

## 2020-04-21 ENCOUNTER — Encounter: Payer: Self-pay | Admitting: Internal Medicine

## 2020-04-21 ENCOUNTER — Other Ambulatory Visit: Payer: Self-pay

## 2020-04-21 ENCOUNTER — Ambulatory Visit (INDEPENDENT_AMBULATORY_CARE_PROVIDER_SITE_OTHER): Payer: Medicare Other | Admitting: Internal Medicine

## 2020-04-21 ENCOUNTER — Other Ambulatory Visit: Payer: Self-pay | Admitting: Internal Medicine

## 2020-04-21 VITALS — BP 124/70 | HR 79 | Temp 97.9°F | Ht 66.0 in | Wt 215.2 lb

## 2020-04-21 DIAGNOSIS — M25512 Pain in left shoulder: Secondary | ICD-10-CM

## 2020-04-21 DIAGNOSIS — I129 Hypertensive chronic kidney disease with stage 1 through stage 4 chronic kidney disease, or unspecified chronic kidney disease: Secondary | ICD-10-CM | POA: Diagnosis not present

## 2020-04-21 DIAGNOSIS — N1831 Chronic kidney disease, stage 3a: Secondary | ICD-10-CM

## 2020-04-21 DIAGNOSIS — N183 Chronic kidney disease, stage 3 unspecified: Secondary | ICD-10-CM | POA: Diagnosis not present

## 2020-04-21 DIAGNOSIS — E1121 Type 2 diabetes mellitus with diabetic nephropathy: Secondary | ICD-10-CM

## 2020-04-21 DIAGNOSIS — E1122 Type 2 diabetes mellitus with diabetic chronic kidney disease: Secondary | ICD-10-CM

## 2020-04-21 MED ORDER — DICLOFENAC SODIUM 1 % EX GEL: 2.0000 g | Freq: Four times a day (QID) | CUTANEOUS | 0 refills | Status: DC

## 2020-04-21 NOTE — Patient Instructions (Signed)
Voltaren gel - apply to left shoulder two to three times daily as needed   Shoulder Pain Many things can cause shoulder pain, including:  An injury.  Moving the shoulder in the same way again and again (overuse).  Joint pain (arthritis). Pain can come from:  Swelling and irritation (inflammation) of any part of the shoulder.  An injury to the shoulder joint.  An injury to: ? Tissues that connect muscle to bone (tendons). ? Tissues that connect bones to each other (ligaments). ? Bones. Follow these instructions at home: Watch for changes in your symptoms. Let your doctor know about them. Follow these instructions to help with your pain. If you have a sling:  Wear the sling as told by your doctor. Remove it only as told by your doctor.  Loosen the sling if your fingers: ? Tingle. ? Become numb. ? Turn cold and blue.  Keep the sling clean.  If the sling is not waterproof: ? Do not let it get wet. ? Take the sling off when you shower or bathe. Managing pain, stiffness, and swelling   If told, put ice on the painful area: ? Put ice in a plastic bag. ? Place a towel between your skin and the bag. ? Leave the ice on for 20 minutes, 2-3 times a day. Stop putting ice on if it does not help with the pain.  Squeeze a soft ball or a foam pad as much as possible. This prevents swelling in the shoulder. It also helps to strengthen the arm. General instructions  Take over-the-counter and prescription medicines only as told by your doctor.  Keep all follow-up visits as told by your doctor. This is important. Contact a doctor if:  Your pain gets worse.  Medicine does not help your pain.  You have new pain in your arm, hand, or fingers. Get help right away if:  Your arm, hand, or fingers: ? Tingle. ? Are numb. ? Are swollen. ? Are painful. ? Turn white or blue. Summary  Shoulder pain can be caused by many things. These include injury, moving the shoulder in the same  away again and again, and joint pain.  Watch for changes in your symptoms. Let your doctor know about them.  This condition may be treated with a sling, ice, and pain medicine.  Contact your doctor if the pain gets worse or you have new pain. Get help right away if your arm, hand, or fingers tingle or get numb, swollen, or painful.  Keep all follow-up visits as told by your doctor. This is important. This information is not intended to replace advice given to you by your health care provider. Make sure you discuss any questions you have with your health care provider. Document Revised: 04/24/2018 Document Reviewed: 04/24/2018 Elsevier Patient Education  [place].

## 2020-04-21 NOTE — Progress Notes (Signed)
This visit occurred during the SARS-CoV-2 public health emergency.  Safety protocols were in place, including screening questions prior to the visit, additional usage of staff PPE, and extensive cleaning of exam room while observing appropriate contact time as indicated for disinfecting solutions.  Subjective:     Patient ID: Tammy Preston , female    DOB: May 25, 1946 , 74 y.o.   MRN: [id number]   Chief Complaint  Patient presents with  . Hypertension  . Arm Pain    left     HPI  She presents today for BP check. She reports compliance with meds.   She also c/o left arm pain. She reports it started about 3 weeks ago, after sleeping on a pillow that was too high. She initially had neck pain as well, but this has since resolved.  Denies fall/trauma.  She has pain with lifting her arm, trying to take off shirts, also with turning the steering wheel. She has not tried anything for relief.   Hypertension This is a chronic problem. The current episode started more than 1 year ago. The problem has been gradually improving since onset. Pertinent negatives include no blurred vision, chest pain, orthopnea or shortness of breath. The current treatment provides moderate improvement.  Arm Pain  The incident occurred more than 1 week ago. There was no injury mechanism. The pain is present in the left shoulder. Pertinent negatives include no chest pain.     Past Medical History:  Diagnosis Date  . Breast cancer ([place])   . Chronic kidney disease    CKD stage 3  . Chronic kidney disease, stage 3 (moderate) 05/20/2017  . Diabetes mellitus without complication ([place])   . Endometrial hyperplasia 05/20/2017  . 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 Hyperlipemia   . Hypertension   . Hypertensive chronic kidney disease with stage 1 through stage 4 chronic kidney disease, or unspecified chronic kidney disease 05/20/2017  . Numbness and tingling of both feet   . Type 2 diabetes  mellitus with diabetic chronic kidney disease ([place]) 05/20/2017     Family History  Problem Relation Age of Onset  . Stroke Mother   . Congestive Heart Failure Father      Current Outpatient Medications:  .  allopurinol (ZYLOPRIM) 100 MG tablet, Take 2 tabs po qd, Disp: 180 tablet, Rfl: 2 .  ammonium lactate (AMLACTIN) 12 % cream, Apply topically as needed for dry skin., Disp: 385 g, Rfl: 0 .  aspirin EC 81 MG tablet, Take 81 mg by mouth daily after breakfast. , Disp: , Rfl:  .  Calcium Carb-Cholecalciferol (CALCIUM + D3 PO), Take 1 tablet by mouth daily. , Disp: , Rfl:  .  Cholecalciferol (VITAMIN D) 2000 units tablet, Take 2,000 Units by mouth daily., Disp: , Rfl:  .  diclofenac Sodium (VOLTAREN) 1 % GEL, Apply 2 g topically 4 (four) times daily. Rub into affected area of foot 2 to 4 times daily, Disp: 100 g, Rfl: 0 .  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  gabapentin (NEURONTIN) 100 MG capsule, Take 1 capsule by mouth once daily at bedtime, Disp: 90 capsule, Rfl: 0 .  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  losartan (COZAAR) 50 MG tablet, One tab po qd, Disp: 90 tablet, Rfl: 2 .  metFORMIN (  GLUCOPHAGE) 500 MG tablet, TAKE 1 TABLET BY MOUTH ONCE DAILY AFTER BREAKFAST, Disp: 90 tablet, Rfl: 1 .  Multiple Vitamins-Minerals (ONE-A-DAY WOMENS 50 PLUS PO), Take 1 tablet by mouth daily., Disp: , Rfl:  .  RESTASIS 0.05 % ophthalmic emulsion, , Disp: , Rfl:  .  simvastatin (ZOCOR) 20 MG tablet, TAKE 1 TABLET DAILY, Disp: 90 tablet, Rfl: 3   Allergies  Allergen Reactions  . Percocet [Oxycodone-Acetaminophen] Nausea And Vomiting  . Shellfish Allergy Other (See Comments)    Causes Gout     Review of Systems  Constitutional: Negative.   Eyes: Negative for blurred vision.  Respiratory: Negative.  Negative for shortness of breath.    Cardiovascular: Negative.  Negative for chest pain and orthopnea.  Gastrointestinal: Negative.   Musculoskeletal: Positive for arthralgias.  Neurological: Negative.   Psychiatric/Behavioral: Negative.      Today's Vitals   04/21/20 0930  BP: 124/70  Pulse: 79  Temp: 97.9 F (36.6 C)  TempSrc: Oral  Weight: 215 lb 3.2 oz (97.6 kg)  Height: 5' 6"  (1.676 m)  PainSc: 5   PainLoc: Arm   Body mass index is 34.73 kg/m.   Objective:  Physical Exam Vitals and nursing note reviewed.  Constitutional:      Appearance: Normal appearance.  HENT:     Head: Normocephalic and atraumatic.  Cardiovascular:     Rate and Rhythm: Normal rate and regular rhythm.     Heart sounds: Normal heart sounds.  Pulmonary:     Effort: Pulmonary effort is normal.     Breath sounds: Normal breath sounds.  Musculoskeletal:        General: Tenderness present.     Cervical back: Tenderness present.     Comments: l shoulder tenderness.   Skin:    General: Skin is warm.  Neurological:     General: No focal deficit present.     Mental Status: She is alert.  Psychiatric:        Mood and Affect: Mood normal.        Behavior: Behavior normal.         Assessment And Plan:     1. Hypertensive chronic kidney disease with stage 1 through stage 4 chronic kidney disease, or unspecified chronic kidney disease  Chronic, well controlled.  She will continue with current meds. She is encouraged to avoid adding salt to her foods.   2. Stage 3 chronic kidney disease, unspecified whether stage 3a or 3b CKD  Chronic, yet stable.  3. Acute pain of left shoulder  She is advised to apply Voltaren gel to affected area two to three times per day prn. If persistent, I will refer her to Ortho for further evaluation.   4. Type 2 diabetes mellitus with stage 3a chronic kidney disease, without long-term current use of insulin (HCC)  Chronic. I will check labs as listed below.  She is encouraged to incorporate more  exercise into her daily routine.   - BMP8+EGFR - Hemoglobin A1c  [name], MD    THE PATIENT IS ENCOURAGED TO PRACTICE SOCIAL DISTANCING DUE TO THE COVID-19 PANDEMIC.

## 2020-04-22 LAB — BMP8+EGFR
BUN/Creatinine Ratio: 14 (ref 12–28)
BUN: 17 mg/dL (ref 8–27)
CO2: 23 mmol/L (ref 20–29)
Calcium: 9.8 mg/dL (ref 8.7–10.3)
Chloride: 104 mmol/L (ref 96–106)
Creatinine, Ser: 1.24 mg/dL — ABNORMAL HIGH (ref 0.57–1.00)
GFR calc Af Amer: 50 mL/min/{1.73_m2} — ABNORMAL LOW (ref >59)
GFR calc non Af Amer: 43 mL/min/{1.73_m2} — ABNORMAL LOW (ref >59)
Glucose: 149 mg/dL — ABNORMAL HIGH (ref 65–99)
Potassium: 4 mmol/L (ref 3.5–5.2)
Sodium: 142 mmol/L (ref 134–144)

## 2020-04-22 LAB — HEMOGLOBIN A1C
Est. average glucose Bld gHb Est-mCnc: 166 mg/dL
Hgb A1c MFr Bld: 7.4 % — ABNORMAL HIGH (ref 4.8–5.6)

## 2020-05-12 DIAGNOSIS — Z1231 Encounter for screening mammogram for malignant neoplasm of breast: Secondary | ICD-10-CM | POA: Diagnosis not present

## 2020-05-12 LAB — HM MAMMOGRAPHY

## 2020-05-14 ENCOUNTER — Encounter: Payer: Self-pay | Admitting: Internal Medicine

## 2020-06-11 ENCOUNTER — Other Ambulatory Visit: Payer: Self-pay

## 2020-06-11 ENCOUNTER — Ambulatory Visit (INDEPENDENT_AMBULATORY_CARE_PROVIDER_SITE_OTHER): Payer: Medicare Other | Admitting: Podiatry

## 2020-06-11 ENCOUNTER — Ambulatory Visit: Payer: Medicare Other | Admitting: Podiatry

## 2020-06-11 DIAGNOSIS — Q828 Other specified congenital malformations of skin: Secondary | ICD-10-CM | POA: Diagnosis not present

## 2020-06-11 DIAGNOSIS — E1149 Type 2 diabetes mellitus with other diabetic neurological complication: Secondary | ICD-10-CM

## 2020-06-11 DIAGNOSIS — M79674 Pain in right toe(s): Secondary | ICD-10-CM

## 2020-06-11 DIAGNOSIS — M79675 Pain in left toe(s): Secondary | ICD-10-CM

## 2020-06-11 DIAGNOSIS — B351 Tinea unguium: Secondary | ICD-10-CM | POA: Diagnosis not present

## 2020-06-11 MED ORDER — AMMONIUM LACTATE 12 % EX CREA: TOPICAL_CREAM | CUTANEOUS | 4 refills | Status: DC | PRN

## 2020-06-14 NOTE — Progress Notes (Signed)
Subjective: 74 y.o. returns the office today for painful, elongated, thickened toenails which she cannot trim herself and for painful calluses. No open lesions. Denies any acute changes since last appointment and no new complaints today. Denies any systemic complaints such as fevers, chills, nausea, vomiting.   PCP: Glendale Chard, MD  Objective: AAO 3, NAD DP/PT pulses palpable, CRT less than 3 seconds Nails hypertrophic, dystrophic, elongated, brittle, discolored 10. There is tenderness overlying the nails 1-5 bilaterally. There is no surrounding erythema or drainage along the nail sites. Hyperkeratotic tissue submetatarsal bilaterally x2.  No ongoing ulceration, drainage or signs of infection.  No pain with calf compression, swelling, warmth, erythema.  Assessment: Patient presents with symptomatic onychomycosis; hyperkeratotic lesions  Plan: -Treatment options including alternatives, risks, complications were discussed -Nails sharply debrided 10 without complication/bleeding. -Hyperkeratotic lesion sharply debrided x2 without any complications or bleeding -Discussed daily foot inspection. If there are any changes, to call the office immediately.  -Follow-up in 3 months or sooner if any problems are to arise. In the meantime, encouraged to call the office with any questions, concerns, changes symptoms.  Celesta Gentile, DPM

## 2020-07-06 ENCOUNTER — Other Ambulatory Visit: Payer: Self-pay | Admitting: Podiatry

## 2020-07-08 ENCOUNTER — Other Ambulatory Visit: Payer: Self-pay | Admitting: *Deleted

## 2020-07-08 MED ORDER — GABAPENTIN 100 MG PO CAPS: 100.0000 mg | ORAL_CAPSULE | Freq: Every day | ORAL | 0 refills | Status: DC

## 2020-07-08 NOTE — Telephone Encounter (Signed)
[facility] (store # [phone]) sent over a refill request for gabapentin 100 mg and take one capsule by mouth once daily at bedtime and ok per Dr Jacqualyn Posey and called the patient to let her know. Lattie Haw

## 2020-07-09 NOTE — Telephone Encounter (Signed)
Please advise 

## 2020-07-14 ENCOUNTER — Other Ambulatory Visit: Payer: Self-pay | Admitting: Internal Medicine

## 2020-07-21 ENCOUNTER — Ambulatory Visit: Payer: Medicare Other | Admitting: Internal Medicine

## 2020-07-22 ENCOUNTER — Other Ambulatory Visit: Payer: Self-pay

## 2020-07-22 ENCOUNTER — Ambulatory Visit (INDEPENDENT_AMBULATORY_CARE_PROVIDER_SITE_OTHER): Payer: Medicare Other | Admitting: Internal Medicine

## 2020-07-22 ENCOUNTER — Encounter: Payer: Self-pay | Admitting: Internal Medicine

## 2020-07-22 ENCOUNTER — Ambulatory Visit (INDEPENDENT_AMBULATORY_CARE_PROVIDER_SITE_OTHER): Payer: Medicare Other

## 2020-07-22 VITALS — BP 140/70 | HR 91 | Temp 98.1°F | Ht 65.4 in | Wt 212.8 lb

## 2020-07-22 VITALS — BP 140/70 | HR 91 | Temp 98.1°F | Ht 65.4 in | Wt 212.0 lb

## 2020-07-22 DIAGNOSIS — E1122 Type 2 diabetes mellitus with diabetic chronic kidney disease: Secondary | ICD-10-CM

## 2020-07-22 DIAGNOSIS — M1A3790 Chronic gout due to renal impairment, unspecified ankle and foot, without tophus (tophi): Secondary | ICD-10-CM

## 2020-07-22 DIAGNOSIS — I129 Hypertensive chronic kidney disease with stage 1 through stage 4 chronic kidney disease, or unspecified chronic kidney disease: Secondary | ICD-10-CM

## 2020-07-22 DIAGNOSIS — N1831 Chronic kidney disease, stage 3a: Secondary | ICD-10-CM

## 2020-07-22 DIAGNOSIS — M25562 Pain in left knee: Secondary | ICD-10-CM | POA: Diagnosis not present

## 2020-07-22 DIAGNOSIS — Z6834 Body mass index (BMI) 34.0-34.9, adult: Secondary | ICD-10-CM | POA: Diagnosis not present

## 2020-07-22 DIAGNOSIS — E1121 Type 2 diabetes mellitus with diabetic nephropathy: Secondary | ICD-10-CM

## 2020-07-22 DIAGNOSIS — Z23 Encounter for immunization: Secondary | ICD-10-CM

## 2020-07-22 DIAGNOSIS — E6609 Other obesity due to excess calories: Secondary | ICD-10-CM | POA: Diagnosis not present

## 2020-07-22 DIAGNOSIS — Z0000 Encounter for general adult medical examination without abnormal findings: Secondary | ICD-10-CM

## 2020-07-22 LAB — POCT URINALYSIS DIPSTICK
Bilirubin, UA: NEGATIVE
Glucose, UA: NEGATIVE
Ketones, UA: NEGATIVE
Leukocytes, UA: NEGATIVE
Nitrite, UA: NEGATIVE
Protein, UA: NEGATIVE
Spec Grav, UA: 1.02 (ref 1.010–1.025)
Urobilinogen, UA: 0.2 E.U./dL
pH, UA: 6.5 (ref 5.0–8.0)

## 2020-07-22 LAB — POCT UA - MICROALBUMIN
Albumin/Creatinine Ratio, Urine, POC: <30
Creatinine, POC: 200 mg/dL
Microalbumin Ur, POC: 10 mg/L

## 2020-07-22 MED ORDER — DICLOFENAC SODIUM 1 % EX GEL
2.0000 g | Freq: Four times a day (QID) | CUTANEOUS | 1 refills | Status: DC
Start: 2020-07-22 — End: 2020-09-28

## 2020-07-22 NOTE — Patient Instructions (Signed)
Diabetes Mellitus and Exercise Exercising regularly is important for your overall health, especially when you have diabetes (diabetes mellitus). Exercising is not only about losing weight. It has many other health benefits, such as increasing muscle strength and bone density and reducing body fat and stress. This leads to improved fitness, flexibility, and endurance, all of which result in better overall health. Exercise has additional benefits for people with diabetes, including:  Reducing appetite.  Helping to lower and control blood glucose.  Lowering blood pressure.  Helping to control amounts of fatty substances (lipids) in the blood, such as cholesterol and triglycerides.  Helping the body to respond better to insulin (improving insulin sensitivity).  Reducing how much insulin the body needs.  Decreasing the risk for heart disease by: ? Lowering cholesterol and triglyceride levels. ? Increasing the levels of good cholesterol. ? Lowering blood glucose levels. What is my activity plan? Your health care provider or certified diabetes educator can help you make a plan for the type and frequency of exercise (activity plan) that works for you. Make sure that you:  Do at least 150 minutes of moderate-intensity or vigorous-intensity exercise each week. This could be brisk walking, biking, or water aerobics. ? Do stretching and strength exercises, such as yoga or weightlifting, at least 2 times a week. ? Spread out your activity over at least 3 days of the week.  Get some form of physical activity every day. ? Do not go more than 2 days in a row without some kind of physical activity. ? Avoid being inactive for more than 30 minutes at a time. Take frequent breaks to walk or stretch.  Choose a type of exercise or activity that you enjoy, and set realistic goals.  Start slowly, and gradually increase the intensity of your exercise over time. What do I need to know about managing my  diabetes?   Check your blood glucose before and after exercising. ? If your blood glucose is 240 mg/dL (13.3 mmol/L) or higher before you exercise, check your urine for ketones. If you have ketones in your urine, do not exercise until your blood glucose returns to normal. ? If your blood glucose is 100 mg/dL (5.6 mmol/L) or lower, eat a snack containing 15-20 grams of carbohydrate. Check your blood glucose 15 minutes after the snack to make sure that your level is above 100 mg/dL (5.6 mmol/L) before you start your exercise.  Know the symptoms of low blood glucose (hypoglycemia) and how to treat it. Your risk for hypoglycemia increases during and after exercise. Common symptoms of hypoglycemia can include: ? Hunger. ? Anxiety. ? Sweating and feeling clammy. ? Confusion. ? Dizziness or feeling light-headed. ? Increased heart rate or palpitations. ? Blurry vision. ? Tingling or numbness around the mouth, lips, or tongue. ? Tremors or shakes. ? Irritability.  Keep a rapid-acting carbohydrate snack available before, during, and after exercise to help prevent or treat hypoglycemia.  Avoid injecting insulin into areas of the body that are going to be exercised. For example, avoid injecting insulin into: ? The arms, when playing tennis. ? The legs, when jogging.  Keep records of your exercise habits. Doing this can help you and your health care provider adjust your diabetes management plan as needed. Write down: ? Food that you eat before and after you exercise. ? Blood glucose levels before and after you exercise. ? The type and amount of exercise you have done. ? When your insulin is expected to peak, if you use   insulin. Avoid exercising at times when your insulin is peaking.  When you start a new exercise or activity, work with your health care provider to make sure the activity is safe for you, and to adjust your insulin, medicines, or food intake as needed.  Drink plenty of water while  you exercise to prevent dehydration or heat stroke. Drink enough fluid to keep your urine clear or pale yellow. Summary  Exercising regularly is important for your overall health, especially when you have diabetes (diabetes mellitus).  Exercising has many health benefits, such as increasing muscle strength and bone density and reducing body fat and stress.  Your health care provider or certified diabetes educator can help you make a plan for the type and frequency of exercise (activity plan) that works for you.  When you start a new exercise or activity, work with your health care provider to make sure the activity is safe for you, and to adjust your insulin, medicines, or food intake as needed. This information is not intended to replace advice given to you by your health care provider. Make sure you discuss any questions you have with your health care provider. Document Revised: 05/04/2017 Document Reviewed: 03/21/2016 Elsevier Patient Education  2020 [employer]  

## 2020-07-22 NOTE — Patient Instructions (Signed)
Tammy Preston , Thank you for taking time to come for your Medicare Wellness Visit. I appreciate your ongoing commitment to your health goals. Please review the following plan we discussed and let me know if I can assist you in the future.   Screening recommendations/referrals: Colonoscopy: completed 05/02/2018 Mammogram: completed 05/12/2020 Bone Density: completed 01/29/2018 Recommended yearly ophthalmology/optometry visit for glaucoma screening and checkup Recommended yearly dental visit for hygiene and checkup  Vaccinations: Influenza vaccine: today Pneumococcal vaccine: completed 07/18/2019 Tdap vaccine: completed 01/22/2014 Shingles vaccine:  completed   Covid-19: 01/28/2020, 01/04/2020  Advanced directives: Advance directive discussed with you today. Even though you declined this today please call our office should you change your mind and we can give you the proper paperwork for you to fill out.  Conditions/risks identified: smoking  Next appointment: Follow up in one year for your annual wellness visit    Preventive Care 63 Years and Older, Female Preventive care refers to lifestyle choices and visits with your health care provider that can promote health and wellness. What does preventive care include?  A yearly physical exam. This is also called an annual well check.  Dental exams once or twice a year.  Routine eye exams. Ask your health care provider how often you should have your eyes checked.  Personal lifestyle choices, including:  Daily care of your teeth and gums.  Regular physical activity.  Eating a healthy diet.  Avoiding tobacco and drug use.  Limiting alcohol use.  Practicing safe sex.  Taking low-dose aspirin every day.  Taking vitamin and mineral supplements as recommended by your health care provider. What happens during an annual well check? The services and screenings done by your health care provider during your annual well check will depend on your  age, overall health, lifestyle risk factors, and family history of disease. Counseling  Your health care provider may ask you questions about your:  Alcohol use.  Tobacco use.  Drug use.  Emotional well-being.  Home and relationship well-being.  Sexual activity.  Eating habits.  History of falls.  Memory and ability to understand (cognition).  Work and work Statistician.  Reproductive health. Screening  You may have the following tests or measurements:  Height, weight, and BMI.  Blood pressure.  Lipid and cholesterol levels. These may be checked every 5 years, or more frequently if you are over 61 years old.  Skin check.  Lung cancer screening. You may have this screening every year starting at age 73 if you have a 30-pack-year history of smoking and currently smoke or have quit within the past 15 years.  Fecal occult blood test (FOBT) of the stool. You may have this test every year starting at age 52.  Flexible sigmoidoscopy or colonoscopy. You may have a sigmoidoscopy every 5 years or a colonoscopy every 10 years starting at age 66.  Hepatitis C blood test.  Hepatitis B blood test.  Sexually transmitted disease (STD) testing.  Diabetes screening. This is done by checking your blood sugar (glucose) after you have not eaten for a while (fasting). You may have this done every 1-3 years.  Bone density scan. This is done to screen for osteoporosis. You may have this done starting at age 41.  Mammogram. This may be done every 1-2 years. Talk to your health care provider about how often you should have regular mammograms. Talk with your health care provider about your test results, treatment options, and if necessary, the need for more tests. Vaccines  Your  health care provider may recommend certain vaccines, such as:  Influenza vaccine. This is recommended every year.  Tetanus, diphtheria, and acellular pertussis (Tdap, Td) vaccine. You may need a Td booster  every 10 years.  Zoster vaccine. You may need this after age 40.  Pneumococcal 13-valent conjugate (PCV13) vaccine. One dose is recommended after age 13.  Pneumococcal polysaccharide (PPSV23) vaccine. One dose is recommended after age 36. Talk to your health care provider about which screenings and vaccines you need and how often you need them. This information is not intended to replace advice given to you by your health care provider. Make sure you discuss any questions you have with your health care provider. Document Released: 11/06/2015 Document Revised: 06/29/2016 Document Reviewed: 08/11/2015 Elsevier Interactive Patient Education  2017 [employer] Prevention in the Home Falls can cause injuries. They can happen to people of all ages. There are many things you can do to make your home safe and to help prevent falls. What can I do on the outside of my home?  Regularly fix the edges of walkways and driveways and fix any cracks.  Remove anything that might make you trip as you walk through a door, such as a raised step or threshold.  Trim any bushes or trees on the path to your home.  Use bright outdoor lighting.  Clear any walking paths of anything that might make someone trip, such as rocks or tools.  Regularly check to see if handrails are loose or broken. Make sure that both sides of any steps have handrails.  Any raised decks and porches should have guardrails on the edges.  Have any leaves, snow, or ice cleared regularly.  Use sand or salt on walking paths during winter.  Clean up any spills in your garage right away. This includes oil or grease spills. What can I do in the bathroom?  Use night lights.  Install grab bars by the toilet and in the tub and shower. Do not use towel bars as grab bars.  Use non-skid mats or decals in the tub or shower.  If you need to sit down in the shower, use a plastic, non-slip stool.  Keep the floor dry. Clean up any  water that spills on the floor as soon as it happens.  Remove soap buildup in the tub or shower regularly.  Attach bath mats securely with double-sided non-slip rug tape.  Do not have throw rugs and other things on the floor that can make you trip. What can I do in the bedroom?  Use night lights.  Make sure that you have a light by your bed that is easy to reach.  Do not use any sheets or blankets that are too big for your bed. They should not hang down onto the floor.  Have a firm chair that has side arms. You can use this for support while you get dressed.  Do not have throw rugs and other things on the floor that can make you trip. What can I do in the kitchen?  Clean up any spills right away.  Avoid walking on wet floors.  Keep items that you use a lot in easy-to-reach places.  If you need to reach something above you, use a strong step stool that has a grab bar.  Keep electrical cords out of the way.  Do not use floor polish or wax that makes floors slippery. If you must use wax, use non-skid floor wax.  Do not  have throw rugs and other things on the floor that can make you trip. What can I do with my stairs?  Do not leave any items on the stairs.  Make sure that there are handrails on both sides of the stairs and use them. Fix handrails that are broken or loose. Make sure that handrails are as long as the stairways.  Check any carpeting to make sure that it is firmly attached to the stairs. Fix any carpet that is loose or worn.  Avoid having throw rugs at the top or bottom of the stairs. If you do have throw rugs, attach them to the floor with carpet tape.  Make sure that you have a light switch at the top of the stairs and the bottom of the stairs. If you do not have them, ask someone to add them for you. What else can I do to help prevent falls?  Wear shoes that:  Do not have high heels.  Have rubber bottoms.  Are comfortable and fit you well.  Are closed  at the toe. Do not wear sandals.  If you use a stepladder:  Make sure that it is fully opened. Do not climb a closed stepladder.  Make sure that both sides of the stepladder are locked into place.  Ask someone to hold it for you, if possible.  Clearly mark and make sure that you can see:  Any grab bars or handrails.  First and last steps.  Where the edge of each step is.  Use tools that help you move around (mobility aids) if they are needed. These include:  Canes.  Walkers.  Scooters.  Crutches.  Turn on the lights when you go into a dark area. Replace any light bulbs as soon as they burn out.  Set up your furniture so you have a clear path. Avoid moving your furniture around.  If any of your floors are uneven, fix them.  If there are any pets around you, be aware of where they are.  Review your medicines with your doctor. Some medicines can make you feel dizzy. This can increase your chance of falling. Ask your doctor what other things that you can do to help prevent falls. This information is not intended to replace advice given to you by your health care provider. Make sure you discuss any questions you have with your health care provider. Document Released: 08/06/2009 Document Revised: 03/17/2016 Document Reviewed: 11/14/2014 Elsevier Interactive Patient Education  2017 [employer].

## 2020-07-22 NOTE — Addendum Note (Signed)
Addended by: Kellie Simmering on: 07/22/2020 04:23 PM   Modules accepted: Orders

## 2020-07-22 NOTE — Progress Notes (Addendum)
This visit occurred during the SARS-CoV-2 public health emergency.  Safety protocols were in place, including screening questions prior to the visit, additional usage of staff PPE, and extensive cleaning of exam room while observing appropriate contact time as indicated for disinfecting solutions.  Subjective:   ILY DENNO is a 74 y.o. female who presents for Medicare Annual (Subsequent) preventive examination.  Review of Systems     Cardiac Risk Factors include: advanced age (>21men, >52 women);diabetes mellitus;hypertension;obesity (BMI >30kg/m2);sedentary lifestyle;smoking/ tobacco exposure     Objective:    Today's Vitals   07/22/20 1057 07/22/20 1102  BP: 140/70   Pulse: 91   Temp: 98.1 F (36.7 C)   TempSrc: Oral   SpO2: 99%   Weight: 212 lb 12.8 oz (96.5 kg)   Height: 5' 5.4" (1.661 m)   PainSc:  3    Body mass index is 34.98 kg/m.  Advanced Directives 07/22/2020 07/18/2019 05/07/2017 03/27/2017  Does Patient Have a Medical Advance Directive? No No No No  Would patient like information on creating a medical advance directive? No - Patient declined No - Patient declined - Yes (MAU/Ambulatory/Procedural Areas - Information given)    Current Medications (verified) Outpatient Encounter Medications as of 07/22/2020  Medication Sig  . allopurinol (ZYLOPRIM) 100 MG tablet Take 2 tablets by mouth once daily  . ammonium lactate (AMLACTIN) 12 % cream Apply topically as needed for dry skin.  Marland Kitchen aspirin EC 81 MG tablet Take 81 mg by mouth daily after breakfast.   . Calcium Carb-Cholecalciferol (CALCIUM + D3 PO) Take 1 tablet by mouth daily.   . Cholecalciferol (VITAMIN D) 2000 units tablet Take 2,000 Units by mouth daily.  Marland Kitchen FREESTYLE LITE test strip Use as instructed to check blood sugars 2 times per day dx: e11.22  . gabapentin (NEURONTIN) 100 MG capsule Take 1 capsule by mouth once daily at bedtime  . gabapentin (NEURONTIN) 100 MG capsule Take 1 capsule (100 mg total) by mouth  at bedtime.  . Lancets (FREESTYLE) lancets Use as instructed to check blood sugars 2 times per day dx: e11.22  . losartan (COZAAR) 50 MG tablet Take 1 tablet by mouth once daily  . metFORMIN (GLUCOPHAGE) 500 MG tablet TAKE 1 TABLET BY MOUTH ONCE DAILY AFTER BREAKFAST  . Multiple Vitamins-Minerals (ONE-A-DAY WOMENS 50 PLUS PO) Take 1 tablet by mouth daily.  . RESTASIS 0.05 % ophthalmic emulsion   . simvastatin (ZOCOR) 20 MG tablet TAKE 1 TABLET DAILY  . diclofenac Sodium (VOLTAREN) 1 % GEL Apply 2 g topically 4 (four) times daily. Rub into affected area of shoulder 2 to 4 times daily (Patient not taking: Reported on 07/22/2020)   No facility-administered encounter medications on file as of 07/22/2020.    Allergies (verified) Percocet [oxycodone-acetaminophen] and Shellfish allergy   History: Past Medical History:  Diagnosis Date  . Breast cancer ([place])   . Chronic kidney disease    CKD stage 3  . Chronic kidney disease, stage 3 (moderate) 05/20/2017  . Diabetes mellitus without complication ([place])   . Endometrial hyperplasia 05/20/2017  . 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 Hyperlipemia   . Hypertension   . Hypertensive chronic kidney disease with stage 1 through stage 4 chronic kidney disease, or unspecified chronic kidney disease 05/20/2017  . Numbness and tingling of both feet   . Type 2 diabetes mellitus with diabetic chronic kidney disease ([place]) 05/20/2017   Past Surgical History:  Procedure Laterality Date  . BACK SURGERY    .  BREAST LUMPECTOMY Left   . COLONOSCOPY    . DILATATION & CURETTAGE/HYSTEROSCOPY WITH MYOSURE N/A 04/07/2017   Procedure: DILATATION & CURETTAGE/HYSTEROSCOPY WITH MYOSURE;  Surgeon: Servando Salina, MD;  Location: [facility];  Service: Gynecology;  Laterality: N/A;  . KNEE SURGERY     Family History  Problem Relation Age of Onset  . Stroke Mother   . Congestive Heart Failure Father    Social History   Socioeconomic  History  . Marital status: Married    Spouse name: Not on file  . Number of children: Not on file  . Years of education: Not on file  . Highest education level: Not on file  Occupational History  . Occupation: retired  Tobacco Use  . Smoking status: Current Every Day Smoker    Packs/day: 0.50    Years: 52.00    Pack years: 26.00    Types: Cigarettes    Start date: 10/24/1966  . Smokeless tobacco: Never Used  . Tobacco comment: She is advised to decrease number of cigs smoked per day  Vaping Use  . Vaping Use: Never used  Substance and Sexual Activity  . Alcohol use: No    Alcohol/week: 0.0 standard drinks  . Drug use: No  . Sexual activity: Not Currently  Other Topics Concern  . Not on file  Social History Narrative  . Not on file   Social Determinants of Health   Financial Resource Strain: Low Risk   . Difficulty of Paying Living Expenses: Not hard at all  Food Insecurity: No Food Insecurity  . Worried About Charity fundraiser in the Last Year: Never true  . Ran Out of Food in the Last Year: Never true  Transportation Needs: No Transportation Needs  . Lack of Transportation (Medical): No  . Lack of Transportation (Non-Medical): No  Physical Activity: Insufficiently Active  . Days of Exercise per Week: 4 days  . Minutes of Exercise per Session: 30 min  Stress: No Stress Concern Present  . Feeling of Stress : Not at all  Social Connections:   . Frequency of Communication with Friends and Family: Not on file  . Frequency of Social Gatherings with Friends and Family: Not on file  . Attends Religious Services: Not on file  . Active Member of Clubs or Organizations: Not on file  . Attends Archivist Meetings: Not on file  . Marital Status: Not on file    Tobacco Counseling Ready to quit: No Counseling given: Not Answered Comment: She is advised to decrease number of cigs smoked per day   Clinical Intake:  Pre-visit preparation completed: Yes  Pain :  0-10 Pain Score: 3  Pain Type: Chronic pain Pain Location: Knee Pain Orientation: Right, Left Pain Descriptors / Indicators: Pins and needles Pain Onset: More than a month ago Pain Frequency: Intermittent Pain Relieving Factors: elevation  Pain Relieving Factors: elevation  Nutritional Status: BMI > 30  Obese Nutritional Risks: None Diabetes: Yes  How often do you need to have someone help you when you read instructions, pamphlets, or other written materials from your doctor or pharmacy?: 1 - Never What is the last grade level you completed in school?: 12th grade  Diabetic? Yes Nutrition Risk Assessment:  Has the patient had any N/V/D within the last 2 months?  No  Does the patient have any non-healing wounds?  No  Has the patient had any unintentional weight loss or weight gain?  No   Diabetes:  Is the patient  diabetic?  Yes  If diabetic, was a CBG obtained today?  No  Did the patient bring in their glucometer from home?  No  How often do you monitor your CBG's? 3 times a week.   Financial Strains and Diabetes Management:  Are you having any financial strains with the device, your supplies or your medication? No .  Does the patient want to be seen by Chronic Care Management for management of their diabetes?  No  Would the patient like to be referred to a Nutritionist or for Diabetic Management?  No   Diabetic Exams:  Diabetic Eye Exam: Completed 11/85/2020 Diabetic Foot Exam: Overdue, Pt has been advised about the importance in completing this exam. Pt is scheduled for diabetic foot exam on next appointment.  Interpreter Needed?: No  Information entered by :: NAllen LPN   Activities of Daily Living In your present state of health, do you have any difficulty performing the following activities: 07/22/2020  Hearing? N  Vision? N  Difficulty concentrating or making decisions? N  Walking or climbing stairs? N  Dressing or bathing? N  Doing errands, shopping? N   Preparing Food and eating ? N  Using the Toilet? N  In the past six months, have you accidently leaked urine? N  Do you have problems with loss of bowel control? N  Managing your Medications? N  Managing your Finances? N  Housekeeping or managing your Housekeeping? N  Some recent data might be hidden    Patient Care Team: Glendale Chard, MD as PCP - General (Internal Medicine)  Indicate any recent Medical Services you may have received from other than [facility] providers in the past year (date may be approximate).     Assessment:   This is a routine wellness examination for [place].  Hearing/Vision screen  Hearing Screening   125Hz  250Hz  500Hz  1000Hz  2000Hz  3000Hz  4000Hz  6000Hz  8000Hz   Right ear:           Left ear:           Vision Screening Comments: Regular eye exams, Dr. Venetia Maxon  Dietary issues and exercise activities discussed: Current Exercise Habits: Home exercise routine, Type of exercise: Other - see comments (cubi pedaling machine), Time (Minutes): 30, Frequency (Times/Week): 4, Weekly Exercise (Minutes/Week): 120  Goals    . Patient Stated     07/18/2019, no goals    . Patient Stated     07/22/2020, no goals      Depression Screen PHQ 2/9 Scores 07/22/2020 04/20/2020 09/04/2019 07/18/2019 04/23/2019 12/20/2018 09/17/2018  PHQ - 2 Score 0 0 0 0 0 0 0  PHQ- 9 Score - - - 0 - - -    Fall Risk Fall Risk  07/22/2020 04/20/2020 09/04/2019 07/18/2019 04/23/2019  Falls in the past year? 0 0 0 0 0  Comment - - - - -  Number falls in past yr: - 0 - 0 -  Injury with Fall? - 0 - - -  Risk for fall due to : Medication side effect - - Medication side effect -  Follow up Falls evaluation completed;Education provided;Falls prevention discussed - - Falls evaluation completed;Education provided;Falls prevention discussed -    Any stairs in or around the home? Yes  If so, are there any without handrails? No  Home free of loose throw rugs in walkways, pet beds, electrical cords, etc?  Yes  Adequate lighting in your home to reduce risk of falls? Yes   ASSISTIVE DEVICES UTILIZED TO PREVENT FALLS:  Life alert? No  Use of a cane, walker or w/c? No  Grab bars in the bathroom? Yes  Shower chair or bench in shower? No  Elevated toilet seat or a handicapped toilet? No   TIMED UP AND GO:  Was the test performed? No .    Slow and steady  Cognitive Function:     6CIT Screen 07/22/2020 07/18/2019  What Year? 0 points 0 points  What month? 0 points 0 points  What time? 0 points 0 points  Count back from 20 0 points 0 points  Months in reverse 0 points 0 points  Repeat phrase 0 points 0 points  Total Score 0 0    Immunizations Immunization History  Administered Date(s) Administered  . DTaP 01/22/2014  . Fluad Quad(high Dose 65+) 07/22/2020  . Influenza-Unspecified 07/24/2013, 07/11/2018, 07/18/2019  . PFIZER SARS-COV-2 Vaccination 01/04/2020, 01/28/2020  . Pneumococcal Conjugate-13 07/13/2018  . Pneumococcal Polysaccharide-23 07/18/2019  . Pneumococcal-Unspecified 07/10/2017  . Zoster Recombinat (Shingrix) 12/22/2017, 01/27/2018, 04/05/2018    TDAP status: Up to date Flu Vaccine status: Completed at today's visit Pneumococcal vaccine status: Up to date Covid-19 vaccine status: Completed vaccines  Qualifies for Shingles Vaccine? Yes   Zostavax completed No   Shingrix Completed?: Yes  Screening Tests Health Maintenance  Topic Date Due  . FOOT EXAM  04/10/2020  . OPHTHALMOLOGY EXAM  08/28/2020  . HEMOGLOBIN A1C  10/21/2020  . MAMMOGRAM  05/12/2022  . TETANUS/TDAP  01/23/2024  . COLONOSCOPY  05/02/2028  . INFLUENZA VACCINE  Completed  . DEXA SCAN  Completed  . COVID-19 Vaccine  Completed  . Hepatitis C Screening  Completed  . PNA vac Low Risk Adult  Completed    Health Maintenance  Health Maintenance Due  Topic Date Due  . FOOT EXAM  04/10/2020    Colorectal cancer screening: Completed 05/02/2018. Repeat every 3 years Mammogram status:  Completed 05/12/2020. Repeat every year Bone Density status: Completed 01/29/2018.   Lung Cancer Screening: (Low Dose CT Chest recommended if Age 7-80 years, 30 pack-year currently smoking OR have quit w/in 15years.) does not qualify.   Lung Cancer Screening Referral: no  Additional Screening:  Hepatitis C Screening: does qualify; Completed 03/14/2018  Vision Screening: Recommended annual ophthalmology exams for early detection of glaucoma and other disorders of the eye. Is the patient up to date with their annual eye exam?  Yes  Who is the provider or what is the name of the office in which the patient attends annual eye exams? Dr. Venetia Maxon If pt is not established with a provider, would they like to be referred to a provider to establish care? No .   Dental Screening: Recommended annual dental exams for proper oral hygiene  Community Resource Referral / Chronic Care Management: CRR required this visit?  No   CCM required this visit?  No      Plan:     I have personally reviewed and noted the following in the patient's chart:   . Medical and social history . Use of alcohol, tobacco or illicit drugs  . Current medications and supplements . Functional ability and status . Nutritional status . Physical activity . Advanced directives . List of other physicians . Hospitalizations, surgeries, and ER visits in previous 12 months . Vitals . Screenings to include cognitive, depression, and falls . Referrals and appointments  In addition, I have reviewed and discussed with patient certain preventive protocols, quality metrics, and best practice recommendations. A written personalized care plan for preventive  services as well as general preventive health recommendations were provided to patient.     Kellie Simmering, LPN   02/19/7680   Nurse Notes:

## 2020-07-22 NOTE — Progress Notes (Signed)
I,Tianna Badgett,acting as a Education administrator for [name], MD.,have documented all relevant documentation on the behalf of [name], MD,as directed by  [name], MD while in the presence of [name], MD.  This visit occurred during the SARS-CoV-2 public health emergency.  Safety protocols were in place, including screening questions prior to the visit, additional usage of staff PPE, and extensive cleaning of exam room while observing appropriate contact time as indicated for disinfecting solutions.  Subjective:     Patient ID: Tammy Preston , female    DOB: Aug 23, 1946 , 74 y.o.   MRN: [id number]   Chief Complaint  Patient presents with  . Hypertension  . Diabetes    foot exam     HPI  She presents today for BP and diabetes check. She reports compliance with meds.     She also c/o left knee.   Denies fall/trauma.  Described as dull, throbbing pain. She reports she has been going up and down steps more often. She is in the midst of renovating her home.   Hypertension This is a chronic problem. The current episode started more than 1 year ago. The problem has been gradually improving since onset. Pertinent negatives include no blurred vision, chest pain, orthopnea or shortness of breath. The current treatment provides moderate improvement.  Arm Pain  The incident occurred more than 1 week ago. There was no injury mechanism. The pain is present in the left shoulder. Pertinent negatives include no chest pain.     Past Medical History:  Diagnosis Date  . Breast cancer ([place])   . Chronic kidney disease    CKD stage 3  . Chronic kidney disease, stage 3 (moderate) 05/20/2017  . Diabetes mellitus without complication ([place])   . Endometrial hyperplasia 05/20/2017  . 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 Hyperlipemia   . Hypertension   . Hypertensive chronic kidney disease with stage 1 through stage 4 chronic kidney disease, or unspecified  chronic kidney disease 05/20/2017  . Numbness and tingling of both feet   . Type 2 diabetes mellitus with diabetic chronic kidney disease ([place]) 05/20/2017     Family History  Problem Relation Age of Onset  . Stroke Mother   . Congestive Heart Failure Father      Current Outpatient Medications:  .  allopurinol (ZYLOPRIM) 100 MG tablet, Take 2 tablets by mouth once daily, Disp: 180 tablet, Rfl: 0 .  ammonium lactate (AMLACTIN) 12 % cream, Apply topically as needed for dry skin., Disp: 385 g, Rfl: 4 .  aspirin EC 81 MG tablet, Take 81 mg by mouth daily after breakfast. , Disp: , Rfl:  .  Calcium Carb-Cholecalciferol (CALCIUM + D3 PO), Take 1 tablet by mouth daily. , Disp: , Rfl:  .  Cholecalciferol (VITAMIN D) 2000 units tablet, Take 2,000 Units by mouth daily., Disp: , Rfl:  .  diclofenac Sodium (VOLTAREN) 1 % GEL, Apply 2 g topically 4 (four) times daily. Rub into affected area of shoulder 2 to 4 times daily, Disp: 150 g, Rfl: 1 .  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  gabapentin (NEURONTIN) 100 MG capsule, Take 1 capsule by mouth once daily at bedtime, Disp: 90 capsule, Rfl: 0 .  gabapentin (NEURONTIN) 100 MG capsule, Take 1 capsule (100 mg total) by mouth at bedtime., Disp: 90 capsule, Rfl: 0 .  Lancets (  FREESTYLE) lancets, Use as instructed to check blood sugars 2 times per day dx: e11.22, Disp: 300 each, Rfl: 2 .  losartan (COZAAR) 50 MG tablet, Take 1 tablet by mouth once daily, Disp: 90 tablet, Rfl: 2 .  metFORMIN (GLUCOPHAGE) 500 MG tablet, TAKE 1 TABLET BY MOUTH ONCE DAILY AFTER BREAKFAST, Disp: 90 tablet, Rfl: 1 .  Multiple Vitamins-Minerals (ONE-A-DAY WOMENS 50 PLUS PO), Take 1 tablet by mouth daily., Disp: , Rfl:  .  RESTASIS 0.05 % ophthalmic emulsion, , Disp: , Rfl:  .  simvastatin (ZOCOR) 20 MG tablet, TAKE 1 TABLET DAILY, Disp: 90 tablet, Rfl: 3   Allergies  Allergen Reactions  . Percocet  [Oxycodone-Acetaminophen] Nausea And Vomiting  . Shellfish Allergy Other (See Comments)    Causes Gout     Review of Systems  Constitutional: Negative.   Eyes: Negative for blurred vision.  Respiratory: Negative.  Negative for shortness of breath.   Cardiovascular: Negative.  Negative for chest pain and orthopnea.  Gastrointestinal: Negative.   Musculoskeletal: Positive for arthralgias.       She c/o left knee pain--denies fall/trauma. There is some pain with ambulation.    Neurological: Negative.      Today's Vitals   07/22/20 1118  BP: 140/70  Pulse: 91  Temp: 98.1 F (36.7 C)  TempSrc: Oral  Weight: 212 lb (96.2 kg)  Height: 5' 5.4" (1.661 m)   Body mass index is 34.85 kg/m.   BP Readings from Last 3 Encounters:  07/22/20 140/70  07/22/20 140/70  04/21/20 124/70    Objective:  Physical Exam Vitals and nursing note reviewed.  Constitutional:      Appearance: Normal appearance. She is obese.  HENT:     Head: Normocephalic and atraumatic.  Cardiovascular:     Rate and Rhythm: Normal rate and regular rhythm.     Pulses:          Dorsalis pedis pulses are 2+ on the right side and 2+ on the left side.     Heart sounds: Normal heart sounds.  Pulmonary:     Effort: Pulmonary effort is normal.     Breath sounds: Normal breath sounds.  Musculoskeletal:     Comments: Some crepitus of left knee  Feet:     Right foot:     Protective Sensation: 5 sites tested. 5 sites sensed.     Skin integrity: Callus and dry skin present.     Toenail Condition: Fungal disease present.    Left foot:     Protective Sensation: 5 sites tested. 5 sites sensed.     Skin integrity: Callus and dry skin present.     Toenail Condition: Fungal disease present. Skin:    General: Skin is warm.  Neurological:     General: No focal deficit present.     Mental Status: She is alert.  Psychiatric:        Mood and Affect: Mood normal.        Behavior: Behavior normal.         Assessment  And Plan:     1. Hypertensive chronic kidney disease with stage 1 through stage 4 chronic kidney disease, or unspecified chronic kidney disease Comments: Chronic, fair control. She will continue with current meds for now. She is aware optimal BP is less than 130/70. She is encouraged to avoid adding salt to her foods.  2. Type 2 diabetes mellitus with stage 3a chronic kidney disease, without long-term current use of insulin ([facility]) Comments: Diabetic  foot exam was performed.  I did encourage her to be sure to dry her toes completely after bathing.  Encouraged to follow dietary recommendations. I will check labs as listed below.  - Hemoglobin A1c - CMP14+EGFR  3. Acute pain of left knee Comments: Advised to apply topical Voltaren gel to front/back/sides of knee twice daily as needed. She will let me know if her sx persist.   4. 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 I will check uric acid level today.  - Uric acid  5. Class 1 obesity due to excess calories with serious comorbidity and body mass index (BMI) of 34.0 to 34.9 in adult Comments: She was congratulated on her 3 pound weight loss. She is encouraged to strive for BMI less than 30 to decrease cardiac risk. Advised to gradually increase her daily activity as tolerated.   Wt Readings from Last 3 Encounters:  07/22/20 212 lb (96.2 kg)  07/22/20 212 lb 12.8 oz (96.5 kg)  04/21/20 215 lb 3.2 oz (97.6 kg)      Patient was given opportunity to ask questions. Patient verbalized understanding of the plan and was able to repeat key elements of the plan. All questions were answered to their satisfaction.  [name], MD   I, [name], MD, have reviewed all documentation for this visit. The documentation on 07/25/20 for the exam, diagnosis, procedures, and orders are all accurate and complete.  THE PATIENT IS ENCOURAGED TO PRACTICE SOCIAL DISTANCING DUE TO THE  COVID-19 PANDEMIC.

## 2020-07-23 LAB — CMP14+EGFR
ALT: 22 IU/L (ref 0–32)
AST: 20 IU/L (ref 0–40)
Albumin/Globulin Ratio: 1.5 (ref 1.2–2.2)
Albumin: 4.4 g/dL (ref 3.7–4.7)
Alkaline Phosphatase: 99 IU/L (ref 44–121)
BUN/Creatinine Ratio: 12 (ref 12–28)
BUN: 14 mg/dL (ref 8–27)
Bilirubin Total: 0.3 mg/dL (ref 0.0–1.2)
CO2: 25 mmol/L (ref 20–29)
Calcium: 9.9 mg/dL (ref 8.7–10.3)
Chloride: 101 mmol/L (ref 96–106)
Creatinine, Ser: 1.16 mg/dL — ABNORMAL HIGH (ref 0.57–1.00)
GFR calc Af Amer: 54 mL/min/{1.73_m2} — ABNORMAL LOW (ref >59)
GFR calc non Af Amer: 47 mL/min/{1.73_m2} — ABNORMAL LOW (ref >59)
Globulin, Total: 2.9 g/dL (ref 1.5–4.5)
Glucose: 107 mg/dL — ABNORMAL HIGH (ref 65–99)
Potassium: 4.4 mmol/L (ref 3.5–5.2)
Sodium: 140 mmol/L (ref 134–144)
Total Protein: 7.3 g/dL (ref 6.0–8.5)

## 2020-07-23 LAB — HEMOGLOBIN A1C
Est. average glucose Bld gHb Est-mCnc: 128 mg/dL
Hgb A1c MFr Bld: 6.1 % — ABNORMAL HIGH (ref 4.8–5.6)

## 2020-07-23 LAB — URIC ACID: Uric Acid: 4.8 mg/dL (ref 3.1–7.9)

## 2020-08-12 DIAGNOSIS — Z23 Encounter for immunization: Secondary | ICD-10-CM | POA: Diagnosis not present

## 2020-08-13 ENCOUNTER — Ambulatory Visit (INDEPENDENT_AMBULATORY_CARE_PROVIDER_SITE_OTHER): Payer: Medicare Other | Admitting: Podiatry

## 2020-08-13 ENCOUNTER — Other Ambulatory Visit: Payer: Self-pay

## 2020-08-13 DIAGNOSIS — M79675 Pain in left toe(s): Secondary | ICD-10-CM

## 2020-08-13 DIAGNOSIS — M79674 Pain in right toe(s): Secondary | ICD-10-CM | POA: Diagnosis not present

## 2020-08-13 DIAGNOSIS — E1149 Type 2 diabetes mellitus with other diabetic neurological complication: Secondary | ICD-10-CM | POA: Diagnosis not present

## 2020-08-13 DIAGNOSIS — B351 Tinea unguium: Secondary | ICD-10-CM | POA: Diagnosis not present

## 2020-08-13 DIAGNOSIS — Q828 Other specified congenital malformations of skin: Secondary | ICD-10-CM | POA: Diagnosis not present

## 2020-08-19 NOTE — Progress Notes (Signed)
Subjective: 74 y.o. returns the office today for painful, elongated, thickened toenails which she cannot trim herself and for painful calluses. No open lesions.  Denies any increase in swelling or redness.  Denies any acute changes since last appointment and no new complaints today. Denies any systemic complaints such as fevers, chills, nausea, vomiting.   PCP: Glendale Chard, MD  Objective: AAO 3, NAD DP/PT pulses palpable, CRT less than 3 seconds Nails hypertrophic, dystrophic, elongated, brittle, discolored 10. There is tenderness overlying the nails 1-5 bilaterally. There is no surrounding erythema or drainage along the nail sites. Hyperkeratotic tissue submetatarsal bilaterally x2.  No ongoing ulceration, drainage or signs of infection.  No pain with calf compression, swelling, warmth, erythema.  Assessment: Patient presents with symptomatic onychomycosis; hyperkeratotic lesions  Plan: -Treatment options including alternatives, risks, complications were discussed -Nails sharply debrided 10 without complication/bleeding. -Hyperkeratotic lesion sharply debrided x2 without any complications or bleeding -Discussed daily foot inspection. If there are any changes, to call the office immediately. -She reports no further gout symptoms that she had previously since diet change. -Follow-up in 3 months or sooner if any problems are to arise. In the meantime, encouraged to call the office with any questions, concerns, changes symptoms.  Celesta Gentile, DPM

## 2020-09-03 DIAGNOSIS — E119 Type 2 diabetes mellitus without complications: Secondary | ICD-10-CM | POA: Diagnosis not present

## 2020-09-03 DIAGNOSIS — H35033 Hypertensive retinopathy, bilateral: Secondary | ICD-10-CM | POA: Diagnosis not present

## 2020-09-03 DIAGNOSIS — B5801 Toxoplasma chorioretinitis: Secondary | ICD-10-CM | POA: Diagnosis not present

## 2020-09-03 DIAGNOSIS — H43813 Vitreous degeneration, bilateral: Secondary | ICD-10-CM | POA: Diagnosis not present

## 2020-09-03 DIAGNOSIS — H04123 Dry eye syndrome of bilateral lacrimal glands: Secondary | ICD-10-CM | POA: Diagnosis not present

## 2020-09-03 DIAGNOSIS — H16223 Keratoconjunctivitis sicca, not specified as Sjogren's, bilateral: Secondary | ICD-10-CM | POA: Diagnosis not present

## 2020-09-03 DIAGNOSIS — H2513 Age-related nuclear cataract, bilateral: Secondary | ICD-10-CM | POA: Diagnosis not present

## 2020-09-03 LAB — HM DIABETES EYE EXAM

## 2020-09-16 ENCOUNTER — Other Ambulatory Visit: Payer: Self-pay | Admitting: Internal Medicine

## 2020-09-28 ENCOUNTER — Other Ambulatory Visit: Payer: Self-pay

## 2020-09-28 MED ORDER — LOSARTAN POTASSIUM 50 MG PO TABS
50.0000 mg | ORAL_TABLET | Freq: Every day | ORAL | 2 refills | Status: DC
Start: 2020-09-28 — End: 2020-12-28

## 2020-09-28 MED ORDER — DICLOFENAC SODIUM 1 % EX GEL: 2.0000 g | Freq: Four times a day (QID) | CUTANEOUS | 1 refills | Status: DC

## 2020-09-28 MED ORDER — AMMONIUM LACTATE 12 % EX CREA: TOPICAL_CREAM | CUTANEOUS | 4 refills | Status: DC | PRN

## 2020-10-01 ENCOUNTER — Other Ambulatory Visit: Payer: Self-pay

## 2020-10-01 ENCOUNTER — Telehealth: Payer: Self-pay | Admitting: Podiatry

## 2020-10-01 MED ORDER — ALLOPURINOL 100 MG PO TABS
200.0000 mg | ORAL_TABLET | Freq: Every day | ORAL | 0 refills | Status: DC
Start: 2020-10-01 — End: 2020-10-27

## 2020-10-01 NOTE — Telephone Encounter (Signed)
Patient has requested refill for gabapentin, please advise

## 2020-10-02 ENCOUNTER — Other Ambulatory Visit: Payer: Self-pay | Admitting: Podiatry

## 2020-10-02 MED ORDER — GABAPENTIN 100 MG PO CAPS
100.0000 mg | ORAL_CAPSULE | Freq: Every day | ORAL | 0 refills | Status: DC
Start: 2020-10-02 — End: 2020-12-15

## 2020-10-02 NOTE — Telephone Encounter (Signed)
sent 

## 2020-10-27 ENCOUNTER — Other Ambulatory Visit: Payer: Self-pay

## 2020-10-27 MED ORDER — ALLOPURINOL 100 MG PO TABS
200.0000 mg | ORAL_TABLET | Freq: Every day | ORAL | 0 refills | Status: DC
Start: 2020-10-27 — End: 2020-11-23

## 2020-11-16 DIAGNOSIS — R3121 Asymptomatic microscopic hematuria: Secondary | ICD-10-CM | POA: Diagnosis not present

## 2020-11-19 ENCOUNTER — Ambulatory Visit (INDEPENDENT_AMBULATORY_CARE_PROVIDER_SITE_OTHER): Payer: Medicare Other

## 2020-11-19 ENCOUNTER — Other Ambulatory Visit: Payer: Self-pay

## 2020-11-19 ENCOUNTER — Ambulatory Visit (INDEPENDENT_AMBULATORY_CARE_PROVIDER_SITE_OTHER): Payer: Medicare Other | Admitting: Podiatry

## 2020-11-19 DIAGNOSIS — M79675 Pain in left toe(s): Secondary | ICD-10-CM

## 2020-11-19 DIAGNOSIS — M722 Plantar fascial fibromatosis: Secondary | ICD-10-CM

## 2020-11-19 DIAGNOSIS — M25471 Effusion, right ankle: Secondary | ICD-10-CM | POA: Diagnosis not present

## 2020-11-19 DIAGNOSIS — M79674 Pain in right toe(s): Secondary | ICD-10-CM | POA: Diagnosis not present

## 2020-11-19 DIAGNOSIS — B351 Tinea unguium: Secondary | ICD-10-CM | POA: Diagnosis not present

## 2020-11-19 DIAGNOSIS — E1149 Type 2 diabetes mellitus with other diabetic neurological complication: Secondary | ICD-10-CM

## 2020-11-19 MED ORDER — TRIAMCINOLONE ACETONIDE 10 MG/ML IJ SUSP
10.0000 mg | Freq: Once | INTRAMUSCULAR | Status: AC
  Administered 2020-11-19: 10 mg

## 2020-11-19 NOTE — Patient Instructions (Addendum)
You can use voltaren gel on the heel  ---  Plantar Fasciitis (Heel Spur Syndrome) with Rehab The plantar fascia is a fibrous, ligament-like, soft-tissue structure that spans the bottom of the foot. Plantar fasciitis is a condition that causes pain in the foot due to inflammation of the tissue. SYMPTOMS   Pain and tenderness on the underneath side of the foot.  Pain that worsens with standing or walking. CAUSES  Plantar fasciitis is caused by irritation and injury to the plantar fascia on the underneath side of the foot. Common mechanisms of injury include:  Direct trauma to bottom of the foot.  Damage to a small nerve that runs under the foot where the main fascia attaches to the heel bone.  Stress placed on the plantar fascia due to bone spurs. RISK INCREASES WITH:   Activities that place stress on the plantar fascia (running, jumping, pivoting, or cutting).  Poor strength and flexibility.  Improperly fitted shoes.  Tight calf muscles.  Flat feet.  Failure to warm-up properly before activity.  Obesity. PREVENTION  Warm up and stretch properly before activity.  Allow for adequate recovery between workouts.  Maintain physical fitness:  Strength, flexibility, and endurance.  Cardiovascular fitness.  Maintain a health body weight.  Avoid stress on the plantar fascia.  Wear properly fitted shoes, including arch supports for individuals who have flat feet.  PROGNOSIS  If treated properly, then the symptoms of plantar fasciitis usually resolve without surgery. However, occasionally surgery is necessary.  RELATED COMPLICATIONS   Recurrent symptoms that may result in a chronic condition.  Problems of the lower back that are caused by compensating for the injury, such as limping.  Pain or weakness of the foot during push-off following surgery.  Chronic inflammation, scarring, and partial or complete fascia tear, occurring more often from repeated  injections.  TREATMENT  Treatment initially involves the use of ice and medication to help reduce pain and inflammation. The use of strengthening and stretching exercises may help reduce pain with activity, especially stretches of the Achilles tendon. These exercises may be performed at home or with a therapist. Your caregiver may recommend that you use heel cups of arch supports to help reduce stress on the plantar fascia. Occasionally, corticosteroid injections are given to reduce inflammation. If symptoms persist for greater than 6 months despite non-surgical (conservative), then surgery may be recommended.   MEDICATION   If pain medication is necessary, then nonsteroidal anti-inflammatory medications, such as aspirin and ibuprofen, or other minor pain relievers, such as acetaminophen, are often recommended.  Do not take pain medication within 7 days before surgery.  Prescription pain relievers may be given if deemed necessary by your caregiver. Use only as directed and only as much as you need.  Corticosteroid injections may be given by your caregiver. These injections should be reserved for the most serious cases, because they may only be given a certain number of times.  HEAT AND COLD  Cold treatment (icing) relieves pain and reduces inflammation. Cold treatment should be applied for 10 to 15 minutes every 2 to 3 hours for inflammation and pain and immediately after any activity that aggravates your symptoms. Use ice packs or massage the area with a piece of ice (ice massage).  Heat treatment may be used prior to performing the stretching and strengthening activities prescribed by your caregiver, physical therapist, or athletic trainer. Use a heat pack or soak the injury in warm water.  SEEK IMMEDIATE MEDICAL CARE IF:  Treatment seems  to offer no benefit, or the condition worsens.  Any medications produce adverse side effects.  EXERCISES- RANGE OF MOTION (ROM) AND STRETCHING  EXERCISES - Plantar Fasciitis (Heel Spur Syndrome) These exercises may help you when beginning to rehabilitate your injury. Your symptoms may resolve with or without further involvement from your physician, physical therapist or athletic trainer. While completing these exercises, remember:   Restoring tissue flexibility helps normal motion to return to the joints. This allows healthier, less painful movement and activity.  An effective stretch should be held for at least 30 seconds.  A stretch should never be painful. You should only feel a gentle lengthening or release in the stretched tissue.  RANGE OF MOTION - Toe Extension, Flexion  Sit with your right / left leg crossed over your opposite knee.  Grasp your toes and gently pull them back toward the top of your foot. You should feel a stretch on the bottom of your toes and/or foot.  Hold this stretch for 10 seconds.  Now, gently pull your toes toward the bottom of your foot. You should feel a stretch on the top of your toes and or foot.  Hold this stretch for 10 seconds. Repeat  times. Complete this stretch 3 times per day.   RANGE OF MOTION - Ankle Dorsiflexion, Active Assisted  Remove shoes and sit on a chair that is preferably not on a carpeted surface.  Place right / left foot under knee. Extend your opposite leg for support.  Keeping your heel down, slide your right / left foot back toward the chair until you feel a stretch at your ankle or calf. If you do not feel a stretch, slide your bottom forward to the edge of the chair, while still keeping your heel down.  Hold this stretch for 10 seconds. Repeat 3 times. Complete this stretch 2 times per day.   STRETCH  Gastroc, Standing  Place hands on wall.  Extend right / left leg, keeping the front knee somewhat bent.  Slightly point your toes inward on your back foot.  Keeping your right / left heel on the floor and your knee straight, shift your weight toward the wall,  not allowing your back to arch.  You should feel a gentle stretch in the right / left calf. Hold this position for 10 seconds. Repeat 3 times. Complete this stretch 2 times per day.  STRETCH  Soleus, Standing  Place hands on wall.  Extend right / left leg, keeping the other knee somewhat bent.  Slightly point your toes inward on your back foot.  Keep your right / left heel on the floor, bend your back knee, and slightly shift your weight over the back leg so that you feel a gentle stretch deep in your back calf.  Hold this position for 10 seconds. Repeat 3 times. Complete this stretch 2 times per day.  STRETCH  Gastrocsoleus, Standing  Note: This exercise can place a lot of stress on your foot and ankle. Please complete this exercise only if specifically instructed by your caregiver.   Place the ball of your right / left foot on a step, keeping your other foot firmly on the same step.  Hold on to the wall or a rail for balance.  Slowly lift your other foot, allowing your body weight to press your heel down over the edge of the step.  You should feel a stretch in your right / left calf.  Hold this position for 10 seconds.  Repeat this exercise with a slight bend in your right / left knee. Repeat 3 times. Complete this stretch 2 times per day.   STRENGTHENING EXERCISES - Plantar Fasciitis (Heel Spur Syndrome)  These exercises may help you when beginning to rehabilitate your injury. They may resolve your symptoms with or without further involvement from your physician, physical therapist or athletic trainer. While completing these exercises, remember:   Muscles can gain both the endurance and the strength needed for everyday activities through controlled exercises.  Complete these exercises as instructed by your physician, physical therapist or athletic trainer. Progress the resistance and repetitions only as guided.  STRENGTH - Towel Curls  Sit in a chair positioned on a  non-carpeted surface.  Place your foot on a towel, keeping your heel on the floor.  Pull the towel toward your heel by only curling your toes. Keep your heel on the floor. Repeat 3 times. Complete this exercise 2 times per day.  STRENGTH - Ankle Inversion  Secure one end of a rubber exercise band/tubing to a fixed object (table, pole). Loop the other end around your foot just before your toes.  Place your fists between your knees. This will focus your strengthening at your ankle.  Slowly, pull your big toe up and in, making sure the band/tubing is positioned to resist the entire motion.  Hold this position for 10 seconds.  Have your muscles resist the band/tubing as it slowly pulls your foot back to the starting position. Repeat 3 times. Complete this exercises 2 times per day.  Document Released: 10/10/2005 Document Revised: 01/02/2012 Document Reviewed: 01/22/2009 [facility] Patient Information 2014 [place], [place].

## 2020-11-22 NOTE — Progress Notes (Signed)
Subjective: 75 y.o. returns the office today for painful, elongated, thickened toenails which she cannot trim herself and for painful calluses. No open lesions.  Also having discomfort to the bottom of the right heel.  She has been doing stretch exercises daily with improvement and also had some minimal swelling to the ankle but no pain to the ankle.  No recent or falls or trauma.  Denies any acute changes since last appointment and no new complaints today. Denies any systemic complaints such as fevers, chills, nausea, vomiting.   PCP: Glendale Chard, MD  Objective: AAO 3, NAD DP/PT pulses palpable, CRT less than 3 seconds Nails hypertrophic, dystrophic, elongated, brittle, discolored 10. There is tenderness overlying the nails 1-5 bilaterally. There is no surrounding erythema or drainage along the nail sites. Hyperkeratotic tissue submetatarsal bilaterally x2.  No ongoing ulceration, drainage or signs of infection.  On the right side there is tenderness palpation on the plantar medial tubercle of the calcaneus at the insertion of plantar fascia.  Plantar fascial.  Intact.  No pain with all compression of calcaneus.  No pain Achilles tendon.  Minimal edema to lateral aspect of the ankle but there is no pain associated this.  No area pinpoint tenderness. No pain with calf compression, swelling, warmth, erythema.  Assessment: Patient presents with symptomatic onychomycosis; hyperkeratotic lesions; right plantar fasciitis with lateral ankle swelling  Plan: -Treatment options including alternatives, risks, complications were discussed -Nails sharply debrided 10 without complication/bleeding. -Hyperkeratotic lesion sharply debrided x2 without any complications or bleeding -X-rays obtained and reviewed.  No evidence of acute fracture or stress fracture. -Steroid injection performed to the right plantar fascial.  See procedure note below.  Continue stretching, icing daily.  Discussed shoe  modifications and orthotics.  Compression anklet dispensed. -Discussed daily foot inspection. If there are any changes, to call the office immediately.  Procedure: Injection Tendon/Ligament Discussed alternatives, risks, complications and verbal consent was obtained.  Location: Right plantar fascia at the glabrous junction; medial approach. Skin Prep: Alcohol  Injectate: 0.5cc 0.5% marcaine plain, 0.5 cc 2% lidocaine plain and, 1 cc kenalog 10. Disposition: Patient tolerated procedure well. Injection site dressed with a band-aid.  Post-injection care was discussed and return precautions discussed.    Return in about 2 months (around 01/19/2021).  Trula Slade DPM   Celesta Gentile, DPM

## 2020-11-23 ENCOUNTER — Telehealth: Payer: Self-pay

## 2020-11-23 ENCOUNTER — Ambulatory Visit: Payer: Medicare Other | Admitting: Internal Medicine

## 2020-11-23 ENCOUNTER — Other Ambulatory Visit: Payer: Self-pay

## 2020-11-23 MED ORDER — METFORMIN HCL 500 MG PO TABS: ORAL_TABLET | ORAL | 1 refills | Status: DC

## 2020-11-23 MED ORDER — ALLOPURINOL 100 MG PO TABS
200.0000 mg | ORAL_TABLET | Freq: Every day | ORAL | 1 refills | Status: DC
Start: 2020-11-23 — End: 2021-07-28

## 2020-11-23 NOTE — Telephone Encounter (Signed)
Left message for pt to call and reschedule appt due to Dr Baird Cancer out sick.

## 2020-12-15 ENCOUNTER — Encounter: Payer: Self-pay | Admitting: Internal Medicine

## 2020-12-15 ENCOUNTER — Other Ambulatory Visit: Payer: Self-pay | Admitting: Internal Medicine

## 2020-12-15 ENCOUNTER — Ambulatory Visit (INDEPENDENT_AMBULATORY_CARE_PROVIDER_SITE_OTHER): Payer: Medicare Other | Admitting: Internal Medicine

## 2020-12-15 ENCOUNTER — Other Ambulatory Visit: Payer: Self-pay

## 2020-12-15 VITALS — BP 150/66 | HR 105 | Temp 98.0°F | Ht 65.4 in | Wt 217.0 lb

## 2020-12-15 DIAGNOSIS — R0982 Postnasal drip: Secondary | ICD-10-CM

## 2020-12-15 DIAGNOSIS — Z6835 Body mass index (BMI) 35.0-35.9, adult: Secondary | ICD-10-CM

## 2020-12-15 DIAGNOSIS — Z87891 Personal history of nicotine dependence: Secondary | ICD-10-CM | POA: Diagnosis not present

## 2020-12-15 DIAGNOSIS — J309 Allergic rhinitis, unspecified: Secondary | ICD-10-CM

## 2020-12-15 DIAGNOSIS — N1831 Chronic kidney disease, stage 3a: Secondary | ICD-10-CM | POA: Diagnosis not present

## 2020-12-15 DIAGNOSIS — I129 Hypertensive chronic kidney disease with stage 1 through stage 4 chronic kidney disease, or unspecified chronic kidney disease: Secondary | ICD-10-CM | POA: Diagnosis not present

## 2020-12-15 DIAGNOSIS — E1122 Type 2 diabetes mellitus with diabetic chronic kidney disease: Secondary | ICD-10-CM | POA: Diagnosis not present

## 2020-12-15 DIAGNOSIS — F17200 Nicotine dependence, unspecified, uncomplicated: Secondary | ICD-10-CM

## 2020-12-15 MED ORDER — DICLOFENAC SODIUM 1 % EX GEL: 2.0000 g | Freq: Four times a day (QID) | CUTANEOUS | 1 refills | Status: DC

## 2020-12-15 NOTE — Progress Notes (Signed)
I,[name],acting as a scribe for [name], MD.,have documented all relevant documentation on the behalf of [name], MD,as directed by  [name], MD while in the presence of [name], MD.  This visit occurred during the SARS-CoV-2 public health emergency.  Safety protocols were in place, including screening questions prior to the visit, additional usage of staff PPE, and extensive cleaning of exam room while observing appropriate contact time as indicated for disinfecting solutions.  Subjective:     Patient ID: Tammy Preston , female    DOB: 01/20/1946 , 75 y.o.   MRN: [id number]   Chief Complaint  Patient presents with  . Diabetes  . Hypertension    HPI  She presents today for BP and diabetes check.  She reports compliance with meds. She denies headaches, chest pain and shortness of breath.   Hypertension This is a chronic problem. The current episode started more than 1 year ago. The problem has been gradually improving since onset. Pertinent negatives include no blurred vision, chest pain, orthopnea or shortness of breath. The current treatment provides moderate improvement.  Arm Pain  The incident occurred more than 1 week ago. There was no injury mechanism. The pain is present in the left shoulder. Pertinent negatives include no chest pain.     Past Medical History:  Diagnosis Date  . Breast cancer (HCC)   . Chronic kidney disease    CKD stage 3  . Chronic kidney disease, stage 3 (moderate) 05/20/2017  . Diabetes mellitus without complication (HCC)   . Endometrial hyperplasia 05/20/2017  . 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 Hyperlipemia   . Hypertension   . Hypertensive chronic kidney disease with stage 1 through stage 4 chronic kidney disease, or unspecified chronic kidney disease 05/20/2017  . Numbness and tingling of both feet   . Type 2 diabetes mellitus with diabetic chronic kidney disease (HCC)  05/20/2017     Family History  Problem Relation Age of Onset  . Stroke Mother   . Congestive Heart Failure Father      Current Outpatient Medications:  .  allopurinol (ZYLOPRIM) 100 MG tablet, Take 2 tablets (200 mg total) by mouth daily., Disp: 180 tablet, Rfl: 1 .  ammonium lactate (AMLACTIN) 12 % cream, Apply topically as needed for dry skin., Disp: 385 g, Rfl: 4 .  aspirin EC 81 MG tablet, Take 81 mg by mouth daily after breakfast. , Disp: , Rfl:  .  Calcium Carb-Cholecalciferol (CALCIUM + D3 PO), Take 1 tablet by mouth daily. , Disp: , Rfl:  .  Cholecalciferol (VITAMIN D) 2000 units tablet, Take 2,000 Units by mouth daily., Disp: , Rfl:  .  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  gabapentin (NEURONTIN) 100 MG capsule, Take 1 capsule by mouth once daily at bedtime, Disp: 90 capsule, Rfl: 0 .  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  losartan (COZAAR) 50 MG tablet, Take 1 tablet (50 mg total) by mouth daily., Disp: 90 tablet, Rfl: 2 .  metFORMIN (GLUCOPHAGE) 500 MG tablet, TAKE 1 TABLET BY MOUTH ONCE DAILY AFTER BREAKFAST, Disp: 90 tablet, Rfl: 1 .  Multiple Vitamins-Minerals (ONE-A-DAY WOMENS 50 PLUS PO), Take 1 tablet by mouth daily., Disp: , Rfl:  .  RESTASIS 0.05 % ophthalmic emulsion, ,   Disp: , Rfl:  .  simvastatin (ZOCOR) 20 MG tablet, TAKE 1 TABLET DAILY, Disp: 90 tablet, Rfl: 3 .  diclofenac Sodium (VOLTAREN) 1 % GEL, Apply 2 g topically 4 (four) times daily. Rub into affected area of shoulder 2 to 4 times daily, Disp: 150 g, Rfl: 1   Allergies  Allergen Reactions  . Percocet [Oxycodone-Acetaminophen] Nausea And Vomiting  . Shellfish Allergy Other (See Comments)    Causes Gout     Review of Systems  Constitutional: Negative.   HENT: Positive for postnasal drip.   Eyes: Negative for blurred vision.   Respiratory: Negative.  Negative for shortness of breath.   Cardiovascular: Negative.  Negative for chest pain and orthopnea.  Gastrointestinal: Negative.   Psychiatric/Behavioral: Negative.   All other systems reviewed and are negative.    Today's Vitals   12/15/20 1108  BP: (!) 150/66  Pulse: (!) 105  Temp: 98 F (36.7 C)  TempSrc: Oral  Weight: 217 lb (98.4 kg)  Height: 5' 5.4" (1.661 m)  PainSc: 2   PainLoc: Foot   Body mass index is 35.67 kg/m.  Wt Readings from Last 3 Encounters:  12/15/20 217 lb (98.4 kg)  07/22/20 212 lb (96.2 kg)  07/22/20 212 lb 12.8 oz (96.5 kg)   BP Readings from Last 3 Encounters:  12/15/20 (!) 150/66  07/22/20 140/70  07/22/20 140/70     Objective:  Physical Exam Vitals and nursing note reviewed.  Constitutional:      Appearance: Normal appearance. She is obese.  HENT:     Head: Normocephalic and atraumatic.     Nose:     Comments: Masked     Mouth/Throat:     Comments: Masked  Cardiovascular:     Rate and Rhythm: Normal rate and regular rhythm.     Heart sounds: Normal heart sounds.  Pulmonary:     Effort: Pulmonary effort is normal.     Breath sounds: Normal breath sounds.  Musculoskeletal:     Cervical back: Normal range of motion.  Skin:    General: Skin is warm.  Neurological:     General: No focal deficit present.     Mental Status: She is alert and oriented to person, place, and time.         Assessment And Plan:     1. Type 2 diabetes mellitus with stage 3a chronic kidney disease, without long-term current use of insulin (HCC) Comments: Chronic, I will check labs as listed below. Encouraged to aim for at least 150 minutes of exercise per week. I will request her last eye exam.  - BMP8+EGFR - Hemoglobin A1c  2. Hypertensive chronic kidney disease with stage 1 through stage 4 chronic kidney disease, or unspecified chronic kidney disease Comments: Chronic, uncontrolled. I will increase losartan to 54m 2 tabs  daily. She will rto in2 weeks for a nurse visit. I iwll also check bmp at that time. I will see her in six weeks.   3. Allergic rhinitis with postnasal drip Comments: advised to try loratadine 145monce daily. She is reminded she can get this OTC.   4. Personal history of nicotine dependence Comments: She agrees to low dose CT screening. Encouraged to decrease number of cigs smoked/day. She does not wish to quit at this time.  - CT CHEST LUNG CA SCREEN LOW DOSE W/O CM; Future  5. Class 2 severe obesity due to excess calories with serious comorbidity and body mass index (BMI) of 35.0 to 35.9 in  adult (HCC) Comments: She is encouraged to strive for BMI less than 30 to decrease cardiac risk. ADvised to aim for at least 150 minutes of exercise per week.   Patient was given opportunity to ask questions. Patient verbalized understanding of the plan and was able to repeat key elements of the plan. All questions were answered to their satisfaction.   I, [name], MD, have reviewed all documentation for this visit. The documentation on 12/20/20 for the exam, diagnosis, procedures, and orders are all accurate and complete.  THE PATIENT IS ENCOURAGED TO PRACTICE SOCIAL DISTANCING DUE TO THE COVID-19 PANDEMIC.   

## 2020-12-15 NOTE — Patient Instructions (Addendum)
Increase Losartan 50mg  to TWO tablets daily  Diabetes Mellitus and Foot Care Foot care is an important part of your health, especially when you have diabetes. Diabetes may cause you to have problems because of poor blood flow (circulation) to your feet and legs, which can cause your skin to:  Become thinner and drier.  Break more easily.  Heal more slowly.  Peel and crack. You may also have nerve damage (neuropathy) in your legs and feet, causing decreased feeling in them. This means that you may not notice minor injuries to your feet that could lead to more serious problems. Noticing and addressing any potential problems early is the best way to prevent future foot problems. How to care for your feet Foot hygiene  Wash your feet daily with warm water and mild soap. Do not use hot water. Then, pat your feet and the areas between your toes until they are completely dry. Do not soak your feet as this can dry your skin.  Trim your toenails straight across. Do not dig under them or around the cuticle. File the edges of your nails with an emery board or nail file.  Apply a moisturizing lotion or petroleum jelly to the skin on your feet and to dry, brittle toenails. Use lotion that does not contain alcohol and is unscented. Do not apply lotion between your toes.   Shoes and socks  Wear clean socks or stockings every day. Make sure they are not too tight. Do not wear knee-high stockings since they may decrease blood flow to your legs.  Wear shoes that fit properly and have enough cushioning. Always look in your shoes before you put them on to be sure there are no objects inside.  To break in new shoes, wear them for just a few hours a day. This prevents injuries on your feet. Wounds, scrapes, corns, and calluses  Check your feet daily for blisters, cuts, bruises, sores, and redness. If you cannot see the bottom of your feet, use a mirror or ask someone for help.  Do not cut corns or calluses  or try to remove them with medicine.  If you find a minor scrape, cut, or break in the skin on your feet, keep it and the skin around it clean and dry. You may clean these areas with mild soap and water. Do not clean the area with peroxide, alcohol, or iodine.  If you have a wound, scrape, corn, or callus on your foot, look at it several times a day to make sure it is healing and not infected. Check for: ? Redness, swelling, or pain. ? Fluid or blood. ? Warmth. ? Pus or a bad smell.   General tips  Do not cross your legs. This may decrease blood flow to your feet.  Do not use heating pads or hot water bottles on your feet. They may burn your skin. If you have lost feeling in your feet or legs, you may not know this is happening until it is too late.  Protect your feet from hot and cold by wearing shoes, such as at the beach or on hot pavement.  Schedule a complete foot exam at least once a year (annually) or more often if you have foot problems. Report any cuts, sores, or bruises to your health care provider immediately. Where to find more information  American Diabetes Association: www.diabetes.org  Association of Diabetes Care & Education Specialists: www.diabeteseducator.org Contact a health care provider if:  You have a medical  condition that increases your risk of infection and you have any cuts, sores, or bruises on your feet.  You have an injury that is not healing.  You have redness on your legs or feet.  You feel burning or tingling in your legs or feet.  You have pain or cramps in your legs and feet.  Your legs or feet are numb.  Your feet always feel cold.  You have pain around any toenails. Get help right away if:  You have a wound, scrape, corn, or callus on your foot and: ? You have pain, swelling, or redness that gets worse. ? You have fluid or blood coming from the wound, scrape, corn, or callus. ? Your wound, scrape, corn, or callus feels warm to the  touch. ? You have pus or a bad smell coming from the wound, scrape, corn, or callus. ? You have a fever. ? You have a red line going up your leg. Summary  Check your feet every day for blisters, cuts, bruises, sores, and redness.  Apply a moisturizing lotion or petroleum jelly to the skin on your feet and to dry, brittle toenails.  Wear shoes that fit properly and have enough cushioning.  If you have foot problems, report any cuts, sores, or bruises to your health care provider immediately.  Schedule a complete foot exam at least once a year (annually) or more often if you have foot problems. This information is not intended to replace advice given to you by your health care provider. Make sure you discuss any questions you have with your health care provider. Document Revised: 04/30/2020 Document Reviewed: 04/30/2020 Elsevier Patient Education  [place].

## 2020-12-16 LAB — BMP8+EGFR
BUN/Creatinine Ratio: 15 (ref 12–28)
BUN: 17 mg/dL (ref 8–27)
CO2: 21 mmol/L (ref 20–29)
Calcium: 10.4 mg/dL — ABNORMAL HIGH (ref 8.7–10.3)
Chloride: 100 mmol/L (ref 96–106)
Creatinine, Ser: 1.17 mg/dL — ABNORMAL HIGH (ref 0.57–1.00)
GFR calc Af Amer: 53 mL/min/{1.73_m2} — ABNORMAL LOW (ref >59)
GFR calc non Af Amer: 46 mL/min/{1.73_m2} — ABNORMAL LOW (ref >59)
Glucose: 164 mg/dL — ABNORMAL HIGH (ref 65–99)
Potassium: 4.3 mmol/L (ref 3.5–5.2)
Sodium: 140 mmol/L (ref 134–144)

## 2020-12-16 LAB — HEMOGLOBIN A1C
Est. average glucose Bld gHb Est-mCnc: 197 mg/dL
Hgb A1c MFr Bld: 8.5 % — ABNORMAL HIGH (ref 4.8–5.6)

## 2020-12-17 ENCOUNTER — Encounter: Payer: Self-pay | Admitting: Internal Medicine

## 2020-12-24 ENCOUNTER — Telehealth: Payer: Self-pay

## 2020-12-24 NOTE — Telephone Encounter (Signed)
-----   Message from Glendale Chard, MD sent at 12/18/2020  5:42 PM EST ----- Be sure to give her samples and have her f/u in four weeks. Pls remind her to take daily with food at breakfast or lunch, stay hydrated. Be sure to stop if she has n/v/d.  ----- Message ----- From: Michelle Nasuti, CMA Sent: 12/18/2020  10:03 AM EST To: Glendale Chard, MD  The pt said she would rather take the pill instead of an injection. The pt said that she hasn't been exercising like she was because of her foot and that her diet has been bad but she will do better and take the [place].

## 2020-12-24 NOTE — Telephone Encounter (Signed)
Left the pt a message to call the office back. 

## 2020-12-28 ENCOUNTER — Telehealth: Payer: Self-pay

## 2020-12-28 MED ORDER — LOSARTAN POTASSIUM 100 MG PO TABS: 100.0000 mg | ORAL_TABLET | Freq: Every day | ORAL | 0 refills | Status: DC

## 2020-12-28 NOTE — Telephone Encounter (Signed)
The pt was notified that her samples of farxiga is ready for pickup.

## 2020-12-29 ENCOUNTER — Other Ambulatory Visit: Payer: Medicare Other

## 2020-12-29 ENCOUNTER — Other Ambulatory Visit: Payer: Self-pay

## 2020-12-29 ENCOUNTER — Other Ambulatory Visit: Payer: Self-pay | Admitting: Internal Medicine

## 2020-12-29 ENCOUNTER — Ambulatory Visit: Payer: Medicare Other

## 2020-12-29 VITALS — BP 140/82 | HR 89 | Temp 98.1°F | Ht 65.4 in | Wt 212.8 lb

## 2020-12-29 DIAGNOSIS — Z79899 Other long term (current) drug therapy: Secondary | ICD-10-CM

## 2020-12-29 DIAGNOSIS — I129 Hypertensive chronic kidney disease with stage 1 through stage 4 chronic kidney disease, or unspecified chronic kidney disease: Secondary | ICD-10-CM

## 2020-12-29 MED ORDER — AMLODIPINE BESYLATE 2.5 MG PO TABS: 2.5000 mg | ORAL_TABLET | Freq: Every day | ORAL | 11 refills | Status: DC

## 2020-12-29 NOTE — Progress Notes (Signed)
Patient is here for blood pressure check. She is currently taking valsartan 100mg . BP Readings from Last 3 Encounters:  12/29/20 140/82  12/15/20 (!) 150/66  07/22/20 140/70   Provider advised patient to start amlodipine 2.5 at night. She will return 02/01/2021

## 2020-12-30 LAB — BMP8+EGFR
BUN/Creatinine Ratio: 11 — ABNORMAL LOW (ref 12–28)
BUN: 13 mg/dL (ref 8–27)
CO2: 21 mmol/L (ref 20–29)
Calcium: 10.4 mg/dL — ABNORMAL HIGH (ref 8.7–10.3)
Chloride: 99 mmol/L (ref 96–106)
Creatinine, Ser: 1.23 mg/dL — ABNORMAL HIGH (ref 0.57–1.00)
Glucose: 162 mg/dL — ABNORMAL HIGH (ref 65–99)
Potassium: 4.3 mmol/L (ref 3.5–5.2)
Sodium: 144 mmol/L (ref 134–144)
eGFR: 46 mL/min/{1.73_m2} — ABNORMAL LOW (ref >59)

## 2021-01-01 ENCOUNTER — Other Ambulatory Visit: Payer: Self-pay | Admitting: Podiatry

## 2021-01-01 ENCOUNTER — Telehealth: Payer: Self-pay | Admitting: Podiatry

## 2021-01-01 MED ORDER — GABAPENTIN 100 MG PO CAPS: 100.0000 mg | ORAL_CAPSULE | Freq: Every day | ORAL | 0 refills | Status: DC

## 2021-01-01 NOTE — Telephone Encounter (Signed)
done

## 2021-01-01 NOTE — Telephone Encounter (Signed)
Patient has requested refill for Gabapentin, Please Advise

## 2021-01-01 NOTE — Progress Notes (Signed)
Refilled gabapentin at patient request.

## 2021-01-08 ENCOUNTER — Ambulatory Visit
Admission: RE | Admit: 2021-01-08 | Discharge: 2021-01-08 | Disposition: A | Discharge status: Home / Self Care | Payer: Medicare Other | Source: Ambulatory Visit | Attending: Internal Medicine | Admitting: Internal Medicine

## 2021-01-08 DIAGNOSIS — Z87891 Personal history of nicotine dependence: Secondary | ICD-10-CM

## 2021-01-08 DIAGNOSIS — F17210 Nicotine dependence, cigarettes, uncomplicated: Secondary | ICD-10-CM | POA: Diagnosis not present

## 2021-01-14 ENCOUNTER — Other Ambulatory Visit: Payer: Self-pay | Admitting: Internal Medicine

## 2021-01-14 DIAGNOSIS — I2510 Atherosclerotic heart disease of native coronary artery without angina pectoris: Secondary | ICD-10-CM

## 2021-01-19 ENCOUNTER — Ambulatory Visit (INDEPENDENT_AMBULATORY_CARE_PROVIDER_SITE_OTHER): Payer: Medicare Other | Admitting: Podiatry

## 2021-01-19 ENCOUNTER — Encounter: Payer: Self-pay | Admitting: Podiatry

## 2021-01-19 ENCOUNTER — Other Ambulatory Visit: Payer: Self-pay

## 2021-01-19 DIAGNOSIS — Z1211 Encounter for screening for malignant neoplasm of colon: Secondary | ICD-10-CM | POA: Insufficient documentation

## 2021-01-19 DIAGNOSIS — E1149 Type 2 diabetes mellitus with other diabetic neurological complication: Secondary | ICD-10-CM

## 2021-01-19 DIAGNOSIS — Z860100 Personal history of colon polyps, unspecified: Secondary | ICD-10-CM | POA: Insufficient documentation

## 2021-01-19 DIAGNOSIS — M79674 Pain in right toe(s): Secondary | ICD-10-CM | POA: Diagnosis not present

## 2021-01-19 DIAGNOSIS — E669 Obesity, unspecified: Secondary | ICD-10-CM | POA: Insufficient documentation

## 2021-01-19 DIAGNOSIS — M79675 Pain in left toe(s): Secondary | ICD-10-CM | POA: Diagnosis not present

## 2021-01-19 DIAGNOSIS — K5730 Diverticulosis of large intestine without perforation or abscess without bleeding: Secondary | ICD-10-CM | POA: Insufficient documentation

## 2021-01-19 DIAGNOSIS — E6609 Other obesity due to excess calories: Secondary | ICD-10-CM | POA: Insufficient documentation

## 2021-01-19 DIAGNOSIS — M722 Plantar fascial fibromatosis: Secondary | ICD-10-CM | POA: Diagnosis not present

## 2021-01-19 DIAGNOSIS — I2510 Atherosclerotic heart disease of native coronary artery without angina pectoris: Secondary | ICD-10-CM

## 2021-01-19 DIAGNOSIS — Q828 Other specified congenital malformations of skin: Secondary | ICD-10-CM | POA: Diagnosis not present

## 2021-01-19 DIAGNOSIS — B351 Tinea unguium: Secondary | ICD-10-CM | POA: Diagnosis not present

## 2021-01-19 NOTE — Patient Instructions (Signed)

## 2021-01-21 NOTE — Progress Notes (Signed)
Subjective: 75 y.o. returns the office today for painful, elongated, thickened toenails which she cannot trim herself. Denies any redness or drainage around the nails.  She said the injection did not do much last appointment.  She is having some occasional discomfort.  She stretches intermittently.  No recent injury or falls or changes otherwise since I last saw her.. Denies any systemic complaints such as fevers, chills, nausea, vomiting.   PCP: Glendale Chard, MD  Objective: AAO 3, NAD DP/PT pulses palpable, CRT less than 3 seconds Nails hypertrophic, dystrophic, elongated, brittle, discolored 10. There is tenderness overlying the nails 1-5 bilaterally. There is no surrounding erythema or drainage along the nail sites. Hyperkeratotic lesion submetatarsal bilaterally x2 without any underlying ulceration drainage or any signs of infection. There is modest, on the plantar medial tubercle of the calcaneus at the insertion plantar fashion for the plantar fashion appears to be intact on the right side.  There is no pain with lateral compression of calcaneus.  No edema, erythema.  No pain with Achilles tendon. No open lesions or pre-ulcerative lesions are identified. No pain with calf compression, swelling, warmth, erythema.  Assessment: Patient presents with symptomatic onychomycosis, hyperkeratotic lesions, plan fasciitis  Plan: -Treatment options including alternatives, risks, complications were discussed -Nails sharply debrided 10 without complication/bleeding. -Hyperkeratotic lesion sharply debrided x2 without any complications or bleeding. -Offered another steroid injection which she wants to hold off as the last one did not do much.  I encouraged stretching, icing daily.  Discussed supportive shoes, inserts.  Also consider physical therapy if needed. -Discussed daily foot inspection. If there are any changes, to call the office immediately.  -Follow-up in 3 months or sooner if any  problems are to arise. In the meantime, encouraged to call the office with any questions, concerns, changes symptoms.  Celesta Gentile, DPM

## 2021-01-28 ENCOUNTER — Ambulatory Visit (INDEPENDENT_AMBULATORY_CARE_PROVIDER_SITE_OTHER): Payer: Medicare Other | Admitting: Internal Medicine

## 2021-01-28 ENCOUNTER — Encounter: Payer: Self-pay | Admitting: Internal Medicine

## 2021-01-28 ENCOUNTER — Other Ambulatory Visit: Payer: Self-pay

## 2021-01-28 VITALS — BP 122/74 | HR 92 | Ht 65.5 in | Wt 212.0 lb

## 2021-01-28 DIAGNOSIS — E785 Hyperlipidemia, unspecified: Secondary | ICD-10-CM

## 2021-01-28 DIAGNOSIS — I2510 Atherosclerotic heart disease of native coronary artery without angina pectoris: Secondary | ICD-10-CM | POA: Diagnosis not present

## 2021-01-28 DIAGNOSIS — Z79899 Other long term (current) drug therapy: Secondary | ICD-10-CM | POA: Diagnosis not present

## 2021-01-28 DIAGNOSIS — I10 Essential (primary) hypertension: Secondary | ICD-10-CM | POA: Diagnosis not present

## 2021-01-28 DIAGNOSIS — I2584 Coronary atherosclerosis due to calcified coronary lesion: Secondary | ICD-10-CM

## 2021-01-28 DIAGNOSIS — I7090 Unspecified atherosclerosis: Secondary | ICD-10-CM

## 2021-01-28 DIAGNOSIS — N1832 Chronic kidney disease, stage 3b: Secondary | ICD-10-CM

## 2021-01-28 MED ORDER — ROSUVASTATIN CALCIUM 20 MG PO TABS: 20.0000 mg | ORAL_TABLET | Freq: Every day | ORAL | 3 refills | Status: DC

## 2021-01-28 NOTE — Patient Instructions (Addendum)
Medication Instructions:  START: CRESTOR (ROSUVASTATIN) 20mg  DAILY  STOP: SIMVASTATIN  *If you need a refill on your cardiac medications before your next appointment, please call your pharmacy*  Lab Work: [place] 2-3 [place] THIS, YOU WILL NEED TO BE FASTING PRIOR TO THIS.  If you have labs (blood work) drawn today and your tests are completely normal, you will receive your results only by: [facility] MyChart Message (if you have MyChart) OR . A paper copy in the mail If you have any lab test that is abnormal or we need to change your treatment, we will call you to review the results.  Follow-Up: At [facility], you and your health needs are our priority.  As part of our continuing mission to provide you with exceptional heart care, we have created designated Provider Care Teams.  These Care Teams include your primary Cardiologist (physician) and Advanced Practice Providers (APPs -  Physician Assistants and Nurse Practitioners) who all work together to provide you with the care you need, when you need it.  We recommend signing up for the patient portal called "MyChart".  Sign up information is provided on this After Visit Summary.  MyChart is used to connect with patients for Virtual Visits (Telemedicine).  Patients are able to view lab/test results, encounter notes, upcoming appointments, etc.  Non-urgent messages can be sent to your provider as well.   To learn more about what you can do with MyChart, go to [email or web address].    Your next appointment:   3 month(s)  The format for your next appointment:   In Person  Provider:   Cherlynn Kaiser, MD

## 2021-01-28 NOTE — Progress Notes (Signed)
Cardiology Office Note:    Date:  01/28/2021   ID:  IYAHNA OBRIANT, DOB Dec 17, 1945, MRN [id number]  PCP:  Glendale Chard, MD  Cardiologist:  No primary care provider on file.  Electrophysiologist:  None   Referring MD: Glendale Chard, MD   Chief Complaint/Reason for Referral: Coronary artery calcifications  History of Present Illness:    Tammy Preston is a 75 y.o. female with a history of HTN, breast cancer, CKD, DM, HLD who presents after Chest Ct demonstrated coronary artery calcifications.  Hx of hyperlipidemia currently treated with simvastatin. Lipids reasonably well controlled on simvastatin.   DM2 as well, treated with metformin.   Good BP control with amlodipine and losartan.   Able to be active without symptoms. Likes to shop, and can climb stairs without limitation.  The patient denies chest pain, chest pressure, dyspnea at rest or with exertion, palpitations, PND, orthopnea, or leg swelling. Denies cough, fever, chills. Denies nausea, vomiting. Denies syncope or presyncope. Denies dizziness or lightheadedness.    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   amLODipine (NORVASC) 2.5 MG tablet Take 1 tablet (2.5 mg total) by mouth at bedtime.   ammonium lactate (AMLACTIN) 12 % cream Apply topically as needed for dry skin.   aspirin EC 81 MG tablet Take 81 mg by mouth daily after breakfast.    Calcium Carb-Cholecalciferol (CALCIUM + D3 PO) Take 1 tablet by mouth daily.    Cholecalciferol (VITAMIN D) 2000 units tablet Take 2,000 Units by mouth daily.   FREESTYLE LITE test strip Use as instructed to check blood sugars 2 times per day dx: e11.22   gabapentin (NEURONTIN) 100 MG capsule Take 1 capsule (100 mg total) by mouth at bedtime.   Lancets (FREESTYLE) lancets Use as instructed to check blood sugars 2 times per day dx: e11.22   losartan (COZAAR) 100 MG tablet Take 1 tablet (100 mg total) by mouth daily.   metFORMIN (GLUCOPHAGE) 500 MG tablet TAKE 1 TABLET BY MOUTH ONCE DAILY AFTER BREAKFAST   Multiple Vitamins-Minerals (ONE-A-DAY WOMENS 50 PLUS PO) Take 1 tablet by mouth daily.   RESTASIS 0.05 % ophthalmic emulsion    simvastatin (ZOCOR) 20 MG tablet TAKE 1 TABLET DAILY   [DISCONTINUED] dapagliflozin propanediol (FARXIGA) 10 MG TABS tablet Take 10 mg by mouth daily.   [DISCONTINUED] diclofenac Sodium (VOLTAREN) 1 % GEL Apply 2 g topically 4 (  four) times daily. Rub into affected area of shoulder 2 to 4 times daily     Allergies:   Percocet [oxycodone-acetaminophen] and Shellfish allergy   Social History   Tobacco Use   Smoking status: Former Smoker    Packs/day: 0.75    Years: 54.00    Pack years: 40.50    Types: Cigarettes    Start date: 10/24/1965   Smokeless tobacco: Never Used  Vaping Use   Vaping Use: Never used  Substance Use Topics   Alcohol use: No    Alcohol/week: 0.0 standard drinks   Drug use:  No     Family History: The patient's family history includes Congestive Heart Failure in her father; Stroke in her mother.  ROS:   Please see the history of present illness.    All other systems reviewed and are negative.  EKGs/Labs/Other Studies Reviewed:    The following studies were reviewed today:  EKG:  NSR, LAE  I have independently reviewed the images from CT chest 01/08/21 - cor cal noted.  Recent Labs: 07/22/2020: ALT 22 12/29/2020: BUN 13; Creatinine, Ser 1.23; Potassium 4.3; Sodium 144  Recent Lipid Panel    Component Value Date/Time   CHOL 140 01/16/2020 1041   TRIG 114 01/16/2020 1041   HDL 33 (L) 01/16/2020 1041   CHOLHDL 4.2 01/16/2020 1041   LDLCALC 86 01/16/2020 1041    Physical Exam:    VS:  BP 122/74 (BP Location: Right Arm, Patient Position: Sitting, Cuff Size: Large)   Pulse 92   Ht 5' 5.5" (1.664 m)   Wt 212 lb (96.2 kg)   BMI 34.74 kg/m     Wt Readings from Last 5 Encounters:  01/28/21 212 lb (96.2 kg)  12/29/20 212 lb 12.8 oz (96.5 kg)  12/15/20 217 lb (98.4 kg)  07/22/20 212 lb (96.2 kg)  07/22/20 212 lb 12.8 oz (96.5 kg)    Constitutional: No acute distress Eyes: sclera non-icteric, normal conjunctiva and lids ENMT: normal dentition, moist mucous membranes Cardiovascular: regular rhythm, normal rate, no murmurs. S1 and S2 normal. Radial pulses normal bilaterally. No jugular venous distention.  Respiratory: clear to auscultation bilaterally GI : normal bowel sounds, soft and nontender. No distention.   MSK: extremities warm, well perfused. No edema.  NEURO: grossly nonfocal exam, moves all extremities. PSYCH: alert and oriented x 3, normal mood and affect.   ASSESSMENT:    1. Coronary artery calcification   2. Atherosclerosis   3. Medication management   4. Stage 3b chronic kidney disease ([place])   5. Primary hypertension   6. Hyperlipidemia, unspecified hyperlipidemia type    PLAN:    Coronary artery  calcification Atherosclerosis - Plan: EKG 12-Lead, Lipid panel, Comprehensive metabolic panel Medication management - Plan: Lipid panel, Comprehensive metabolic panel Hyperlipidemia, unspecified hyperlipidemia type - discussed statin intensification today for secondary prevention of CAD. Fortunately asymptomatic. - stop simvastatin, start rosuvastatin 20 mg daily. - continue ASA 81 mg daily.  - repeat labs in 3 mo for LFT and lipids, goal LDL < 70, currently 86.   Primary hypertension - well managed on current regimen, continue.  Total time of encounter: 45 minutes total time of encounter, including 30 minutes spent in face-to-face patient care on the date of this encounter. This time includes coordination of care and counseling regarding above mentioned problem list. Remainder of non-face-to-face time involved reviewing chart documents/testing relevant to the patient encounter and documentation in the medical record. I have independently reviewed documentation from referring provider.  Cherlynn Kaiser, MD, [facility]    Medication Adjustments/Labs and Tests Ordered: Current medicines are reviewed at length with the patient today.  Concerns regarding medicines are outlined above.   Orders Placed This Encounter  Procedures   Lipid panel   Comprehensive metabolic panel   EKG [id number]    Shared Decision Making/Informed Consent:       Meds ordered this encounter  Medications    rosuvastatin (CRESTOR) 20 MG tablet    Sig: Take 1 tablet (20 mg total) by mouth daily.    Dispense:  30 tablet    Refill:  3    Patient Instructions  Medication Instructions:  START: CRESTOR (ROSUVASTATIN) 20mg  DAILY  STOP: SIMVASTATIN  *If you need a refill on your cardiac medications before your next appointment, please call your pharmacy*  Lab Work: LIPIDS & CMP- IN 2-3 MONTHS- PLEASE RETURN FOR THIS, YOU WILL NEED TO BE FASTING PRIOR TO THIS.  If you have labs (blood  work) drawn today and your tests are completely normal, you will receive your results only by: [place] (if you have MyChart) OR A paper copy in the mail If you have any lab test that is abnormal or we need to change your treatment, we will call you to review the results.  Follow-Up: At [facility], you and your health needs are our priority.  As part of our continuing mission to provide you with exceptional heart care, we have created designated Provider Care Teams.  These Care Teams include your primary Cardiologist (physician) and Advanced Practice Providers (APPs -  Physician Assistants and Nurse Practitioners) who all work together to provide you with the care you need, when you need it.  We recommend signing up for the patient portal called "MyChart".  Sign up information is provided on this After Visit Summary.  MyChart is used to connect with patients for Virtual Visits (Telemedicine).  Patients are able to view lab/test results, encounter notes, upcoming appointments, etc.  Non-urgent messages can be sent to your provider as well.   To learn more about what you can do with MyChart, go to [email or web address].    Your next appointment:   3 month(s)  The format for your next appointment:   In Person  Provider:   Cherlynn Kaiser, MD

## 2021-02-01 ENCOUNTER — Ambulatory Visit (INDEPENDENT_AMBULATORY_CARE_PROVIDER_SITE_OTHER): Payer: Medicare Other | Admitting: Internal Medicine

## 2021-02-01 ENCOUNTER — Encounter: Payer: Self-pay | Admitting: Internal Medicine

## 2021-02-01 ENCOUNTER — Other Ambulatory Visit: Payer: Self-pay

## 2021-02-01 VITALS — BP 130/76 | HR 72 | Temp 98.2°F | Ht 65.5 in | Wt 215.0 lb

## 2021-02-01 DIAGNOSIS — I2510 Atherosclerotic heart disease of native coronary artery without angina pectoris: Secondary | ICD-10-CM

## 2021-02-01 DIAGNOSIS — Z79899 Other long term (current) drug therapy: Secondary | ICD-10-CM

## 2021-02-01 DIAGNOSIS — N1831 Chronic kidney disease, stage 3a: Secondary | ICD-10-CM

## 2021-02-01 DIAGNOSIS — E1122 Type 2 diabetes mellitus with diabetic chronic kidney disease: Secondary | ICD-10-CM

## 2021-02-01 MED ORDER — DAPAGLIFLOZIN PROPANEDIOL 10 MG PO TABS: 10.0000 mg | ORAL_TABLET | Freq: Every day | ORAL | 3 refills | Status: DC

## 2021-02-01 NOTE — Patient Instructions (Signed)
Diabetes Mellitus and Foot Care Foot care is an important part of your health, especially when you have diabetes. Diabetes may cause you to have problems because of poor blood flow (circulation) to your feet and legs, which can cause your skin to:  Become thinner and drier.  Break more easily.  Heal more slowly.  Peel and crack. You may also have nerve damage (neuropathy) in your legs and feet, causing decreased feeling in them. This means that you may not notice minor injuries to your feet that could lead to more serious problems. Noticing and addressing any potential problems early is the best way to prevent future foot problems. How to care for your feet Foot hygiene  Wash your feet daily with warm water and mild soap. Do not use hot water. Then, pat your feet and the areas between your toes until they are completely dry. Do not soak your feet as this can dry your skin.  Trim your toenails straight across. Do not dig under them or around the cuticle. File the edges of your nails with an emery board or nail file.  Apply a moisturizing lotion or petroleum jelly to the skin on your feet and to dry, brittle toenails. Use lotion that does not contain alcohol and is unscented. Do not apply lotion between your toes.   Shoes and socks  Wear clean socks or stockings every day. Make sure they are not too tight. Do not wear knee-high stockings since they may decrease blood flow to your legs.  Wear shoes that fit properly and have enough cushioning. Always look in your shoes before you put them on to be sure there are no objects inside.  To break in new shoes, wear them for just a few hours a day. This prevents injuries on your feet. Wounds, scrapes, corns, and calluses  Check your feet daily for blisters, cuts, bruises, sores, and redness. If you cannot see the bottom of your feet, use a mirror or ask someone for help.  Do not cut corns or calluses or try to remove them with medicine.  If you  find a minor scrape, cut, or break in the skin on your feet, keep it and the skin around it clean and dry. You may clean these areas with mild soap and water. Do not clean the area with peroxide, alcohol, or iodine.  If you have a wound, scrape, corn, or callus on your foot, look at it several times a day to make sure it is healing and not infected. Check for: ? Redness, swelling, or pain. ? Fluid or blood. ? Warmth. ? Pus or a bad smell.   General tips  Do not cross your legs. This may decrease blood flow to your feet.  Do not use heating pads or hot water bottles on your feet. They may burn your skin. If you have lost feeling in your feet or legs, you may not know this is happening until it is too late.  Protect your feet from hot and cold by wearing shoes, such as at the beach or on hot pavement.  Schedule a complete foot exam at least once a year (annually) or more often if you have foot problems. Report any cuts, sores, or bruises to your health care provider immediately. Where to find more information  American Diabetes Association: www.diabetes.org  Association of Diabetes Care & Education Specialists: www.diabeteseducator.org Contact a health care provider if:  You have a medical condition that increases your risk of infection and   you have any cuts, sores, or bruises on your feet.  You have an injury that is not healing.  You have redness on your legs or feet.  You feel burning or tingling in your legs or feet.  You have pain or cramps in your legs and feet.  Your legs or feet are numb.  Your feet always feel cold.  You have pain around any toenails. Get help right away if:  You have a wound, scrape, corn, or callus on your foot and: ? You have pain, swelling, or redness that gets worse. ? You have fluid or blood coming from the wound, scrape, corn, or callus. ? Your wound, scrape, corn, or callus feels warm to the touch. ? You have pus or a bad smell coming from  the wound, scrape, corn, or callus. ? You have a fever. ? You have a red line going up your leg. Summary  Check your feet every day for blisters, cuts, bruises, sores, and redness.  Apply a moisturizing lotion or petroleum jelly to the skin on your feet and to dry, brittle toenails.  Wear shoes that fit properly and have enough cushioning.  If you have foot problems, report any cuts, sores, or bruises to your health care provider immediately.  Schedule a complete foot exam at least once a year (annually) or more often if you have foot problems. This information is not intended to replace advice given to you by your health care provider. Make sure you discuss any questions you have with your health care provider. Document Revised: 04/30/2020 Document Reviewed: 04/30/2020 Elsevier Patient Education  2021 [employer]  

## 2021-02-01 NOTE — Progress Notes (Signed)
I,[name],acting as a Education administrator for [name], MD.,have documented all relevant documentation on the behalf of [name], MD,as directed by  [name], MD while in the presence of [name], MD.  This visit occurred during the SARS-CoV-2 public health emergency.  Safety protocols were in place, including screening questions prior to the visit, additional usage of staff PPE, and extensive cleaning of exam room while observing appropriate contact time as indicated for disinfecting solutions.  Subjective:     Patient ID: Tammy Preston , female    DOB: 1946/06/20 , 75 y.o.   MRN: [id number]   Chief Complaint  Patient presents with  . Diabetes    HPI  She presents today for diabetes f/u.  The patient recently started on Farxiga. She has not had any issues with the medication. She denies having UTI and yeast infection. She has been seen by Cardiology who agreed with this medication.   Diabetes She presents for her follow-up diabetic visit. She has type 2 diabetes mellitus. Her disease course has been stable. Pertinent negatives for diabetes include no fatigue, no foot paresthesias, no polydipsia, no polyphagia and no polyuria. There are no hypoglycemic complications. There are no diabetic complications. Risk factors for coronary artery disease include diabetes mellitus, dyslipidemia, hypertension, post-menopausal and sedentary lifestyle. She is compliant with treatment all of the time. She is following a diabetic diet. She participates in exercise intermittently. Her breakfast blood glucose is taken between 8-9 am. Her breakfast blood glucose range is generally 110-130 mg/dl.     Past Medical History:  Diagnosis Date  . Breast cancer ([place])   . Chronic kidney disease    CKD stage 3  . Chronic kidney disease, stage 3 (moderate) 05/20/2017  . Diabetes mellitus without complication ([place])   . Endometrial hyperplasia 05/20/2017  . Glomerular disorders in diseases  classified elsewhere 05/20/2017  . Hyperlipemia   . Hypertension   . Hypertensive chronic kidney disease with stage 1 through stage 4 chronic kidney disease, or unspecified chronic kidney disease 05/20/2017  . Numbness and tingling of both feet   . Type 2 diabetes mellitus with diabetic chronic kidney disease ([place]) 05/20/2017     Family History  Problem Relation Age of Onset  . Stroke Mother   . Congestive Heart Failure Father      Current Outpatient Medications:  .  allopurinol (ZYLOPRIM) 100 MG tablet, Take 2 tablets (200 mg total) by mouth daily., Disp: 180 tablet, Rfl: 1 .  amLODipine (NORVASC) 2.5 MG tablet, Take 1 tablet (2.5 mg total) by mouth at bedtime., Disp: 30 tablet, Rfl: 11 .  ammonium lactate (AMLACTIN) 12 % cream, Apply topically as needed for dry skin., Disp: 385 g, Rfl: 4 .  aspirin EC 81 MG tablet, Take 81 mg by mouth daily after breakfast. , Disp: , Rfl:  .  Calcium Carb-Cholecalciferol (CALCIUM + D3 PO), Take 1 tablet by mouth daily. , Disp: , Rfl:  .  Cholecalciferol (VITAMIN D) 2000 units tablet, Take 2,000 Units by mouth daily., Disp: , Rfl:  .  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  gabapentin (NEURONTIN) 100 MG capsule, Take 1 capsule (100 mg total) by mouth at bedtime., Disp: 90 capsule, Rfl: 0 .  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  losartan (COZAAR)  100 MG tablet, Take 1 tablet (100 mg total) by mouth daily., Disp: 90 tablet, Rfl: 0 .  metFORMIN (GLUCOPHAGE) 500 MG tablet, TAKE 1 TABLET BY MOUTH ONCE DAILY AFTER BREAKFAST, Disp: 90 tablet, Rfl: 1 .  Multiple Vitamins-Minerals (ONE-A-DAY WOMENS 50 PLUS PO), Take 1 tablet by mouth daily., Disp: , Rfl:  .  RESTASIS 0.05 % ophthalmic emulsion, , Disp: , Rfl:  .  rosuvastatin (CRESTOR) 20 MG tablet, Take 1 tablet (20 mg total) by mouth daily., Disp: 30  tablet, Rfl: 3 .  dapagliflozin propanediol (FARXIGA) 10 MG TABS tablet, Take 1 tablet (10 mg total) by mouth daily., Disp: 30 tablet, Rfl: 3   Allergies  Allergen Reactions  . Percocet [Oxycodone-Acetaminophen] Nausea And Vomiting  . Shellfish Allergy Other (See Comments)    Causes Gout     Review of Systems  Constitutional: Negative.  Negative for fatigue.  Respiratory: Negative.   Cardiovascular: Negative.   Gastrointestinal: Negative.   Endocrine: Negative for polydipsia, polyphagia and polyuria.  Neurological: Negative.   Psychiatric/Behavioral: Negative.      Today's Vitals   02/01/21 1544  BP: 130/76  Pulse: 72  Temp: 98.2 F (36.8 C)  TempSrc: Oral  Weight: 215 lb (97.5 kg)  Height: 5' 5.5" (1.664 m)  PainSc: 0-No pain   Body mass index is 35.23 kg/m.   Objective:  Physical Exam Vitals and nursing note reviewed.  Constitutional:      Appearance: Normal appearance. She is obese.  HENT:     Head: Normocephalic and atraumatic.     Nose:     Comments: Masked     Mouth/Throat:     Comments: Masked  Cardiovascular:     Rate and Rhythm: Normal rate and regular rhythm.     Heart sounds: Normal heart sounds.  Pulmonary:     Effort: Pulmonary effort is normal.     Breath sounds: Normal breath sounds.  Skin:    General: Skin is warm.  Neurological:     General: No focal deficit present.     Mental Status: She is alert.  Psychiatric:        Mood and Affect: Mood normal.        Behavior: Behavior normal.         Assessment And Plan:     1. Type 2 diabetes mellitus with stage 3a chronic kidney disease, without long-term current use of insulin ([place]) Comments: I will check renal function today to f/u [place]. I will also send this to the pharmacy.  She will f/u in June 2022 for her next diabetes check.  - BMP8+EGFR  2. Atherosclerosis of native coronary artery of native heart without angina pectoris Comments: Cardiology note reviewed, I appreciate their  input. I agree with change in statin therapy to rosuvastatin 37m daily.  3. Drug therapy     Patient was given opportunity to ask questions. Patient verbalized understanding of the plan and was able to repeat key elements of the plan. All questions were answered to their satisfaction.   I, RMaximino Greenland MD, have reviewed all documentation for this visit. The documentation on 02/01/21 for the exam, diagnosis, procedures, and orders are all accurate and complete.   IF YOU HAVE BEEN REFERRED TO A SPECIALIST, IT MAY TAKE 1-2 WEEKS TO SCHEDULE/PROCESS THE REFERRAL. IF YOU HAVE NOT HEARD FROM US/SPECIALIST IN TWO WEEKS, PLEASE GIVE [place] CALL AT [phone].   THE PATIENT IS ENCOURAGED TO PRACTICE SOCIAL DISTANCING DUE TO THE  COVID-19 PANDEMIC.

## 2021-02-02 ENCOUNTER — Other Ambulatory Visit: Payer: Self-pay

## 2021-02-02 LAB — BMP8+EGFR
BUN/Creatinine Ratio: 15 (ref 12–28)
BUN: 19 mg/dL (ref 8–27)
CO2: 24 mmol/L (ref 20–29)
Calcium: 10 mg/dL (ref 8.7–10.3)
Chloride: 100 mmol/L (ref 96–106)
Creatinine, Ser: 1.24 mg/dL — ABNORMAL HIGH (ref 0.57–1.00)
Glucose: 190 mg/dL — ABNORMAL HIGH (ref 65–99)
Potassium: 4.3 mmol/L (ref 3.5–5.2)
Sodium: 139 mmol/L (ref 134–144)
eGFR: 46 mL/min/{1.73_m2} — ABNORMAL LOW (ref >59)

## 2021-02-02 MED ORDER — EMPAGLIFLOZIN 10 MG PO TABS: 10.0000 mg | ORAL_TABLET | Freq: Every day | ORAL | 1 refills | Status: DC

## 2021-03-25 ENCOUNTER — Other Ambulatory Visit: Payer: Self-pay | Admitting: Internal Medicine

## 2021-03-30 ENCOUNTER — Other Ambulatory Visit: Payer: Self-pay

## 2021-03-30 ENCOUNTER — Ambulatory Visit (INDEPENDENT_AMBULATORY_CARE_PROVIDER_SITE_OTHER): Payer: Medicare Other | Admitting: Podiatry

## 2021-03-30 ENCOUNTER — Encounter: Payer: Self-pay | Admitting: Podiatry

## 2021-03-30 DIAGNOSIS — B351 Tinea unguium: Secondary | ICD-10-CM | POA: Diagnosis not present

## 2021-03-30 DIAGNOSIS — M79675 Pain in left toe(s): Secondary | ICD-10-CM | POA: Diagnosis not present

## 2021-03-30 DIAGNOSIS — M79674 Pain in right toe(s): Secondary | ICD-10-CM | POA: Diagnosis not present

## 2021-03-30 DIAGNOSIS — E1149 Type 2 diabetes mellitus with other diabetic neurological complication: Secondary | ICD-10-CM | POA: Diagnosis not present

## 2021-03-30 DIAGNOSIS — Q828 Other specified congenital malformations of skin: Secondary | ICD-10-CM | POA: Diagnosis not present

## 2021-04-04 NOTE — Progress Notes (Signed)
Subjective: 75 y.o. returns the office today for painful, elongated, thickened toenails which she cannot trim herself. Denies any redness or drainage around the nails.  She also states that she gets "itching" to the bottom of her feet at times.  She states when she gets upset this makes her symptoms worse.  This is intermittent as well.  No recent injury or falls or changes otherwise since I last saw her.  PCP: Glendale Chard, MD Last see: 02/01/2021  Objective: AAO 3, NAD DP/PT pulses palpable, CRT less than 3 seconds Nails hypertrophic, dystrophic, elongated, brittle, discolored 10. There is tenderness overlying the nails 1-5 bilaterally. There is no surrounding erythema or drainage along the nail sites. Hyperkeratotic lesion submetatarsal bilaterally x2 without any underlying ulceration drainage or any signs of infection. There is no significant pain today on the plantar fascial.  No areas of pinpoint tenderness.  MMT 5/5. No open lesions or pre-ulcerative lesions are identified. No pain with calf compression, swelling, warmth, erythema.  Assessment: Patient presents with symptomatic onychomycosis, hyperkeratotic lesions, neuropathy  Plan: -Treatment options including alternatives, risks, complications were discussed -Nails sharply debrided 10 without complication/bleeding. -Hyperkeratotic lesion sharply debrided x2 without any complications or bleeding. -Continue gabapentin.  Discussed with her that if she gets the itching sensation I think this is more nerves exits problems with her getting upset.  Discussed that she can take the gabapentin if needed to see if this will be helpful. -Discussed daily foot inspection. If there are any changes, to call the office immediately.  -Follow-up in 3 months or sooner if any problems are to arise. In the meantime, encouraged to call the office with any questions, concerns, changes symptoms.  Celesta Gentile, DPM

## 2021-04-07 ENCOUNTER — Other Ambulatory Visit: Payer: Self-pay

## 2021-04-07 ENCOUNTER — Encounter: Payer: Self-pay | Admitting: Internal Medicine

## 2021-04-07 ENCOUNTER — Ambulatory Visit (INDEPENDENT_AMBULATORY_CARE_PROVIDER_SITE_OTHER): Payer: Medicare Other | Admitting: Internal Medicine

## 2021-04-07 VITALS — Temp 98.0°F | Ht 65.5 in | Wt 206.2 lb

## 2021-04-07 DIAGNOSIS — Z6833 Body mass index (BMI) 33.0-33.9, adult: Secondary | ICD-10-CM

## 2021-04-07 DIAGNOSIS — R42 Dizziness and giddiness: Secondary | ICD-10-CM | POA: Diagnosis not present

## 2021-04-07 DIAGNOSIS — Z79899 Other long term (current) drug therapy: Secondary | ICD-10-CM | POA: Diagnosis not present

## 2021-04-07 DIAGNOSIS — E1122 Type 2 diabetes mellitus with diabetic chronic kidney disease: Secondary | ICD-10-CM | POA: Diagnosis not present

## 2021-04-07 DIAGNOSIS — I129 Hypertensive chronic kidney disease with stage 1 through stage 4 chronic kidney disease, or unspecified chronic kidney disease: Secondary | ICD-10-CM

## 2021-04-07 DIAGNOSIS — I2510 Atherosclerotic heart disease of native coronary artery without angina pectoris: Secondary | ICD-10-CM | POA: Diagnosis not present

## 2021-04-07 DIAGNOSIS — N1831 Chronic kidney disease, stage 3a: Secondary | ICD-10-CM

## 2021-04-07 DIAGNOSIS — E6609 Other obesity due to excess calories: Secondary | ICD-10-CM | POA: Diagnosis not present

## 2021-04-07 DIAGNOSIS — R000 Tachycardia, unspecified: Secondary | ICD-10-CM | POA: Diagnosis not present

## 2021-04-07 DIAGNOSIS — E7800 Pure hypercholesterolemia, unspecified: Secondary | ICD-10-CM | POA: Diagnosis not present

## 2021-04-07 MED ORDER — MECLIZINE HCL 25 MG PO TABS: 25.0000 mg | ORAL_TABLET | Freq: Three times a day (TID) | ORAL | 0 refills | Status: DC | PRN

## 2021-04-07 MED ORDER — LOSARTAN POTASSIUM 100 MG PO TABS: ORAL_TABLET | ORAL | 2 refills | Status: DC

## 2021-04-07 NOTE — Progress Notes (Signed)
I,[name],acting as a Education administrator for [name], MD.,have documented all relevant documentation on the behalf of [name], MD,as directed by  [name], MD while in the presence of [name], MD.  This visit occurred during the SARS-CoV-2 public health emergency.  Safety protocols were in place, including screening questions prior to the visit, additional usage of staff PPE, and extensive cleaning of exam room while observing appropriate contact time as indicated for disinfecting solutions.  Subjective:     Patient ID: Tammy Preston , female    DOB: 1946/05/05 , 75 y.o.   MRN: [id number]   Chief Complaint  Patient presents with   Diabetes   Hypertension    HPI  She presents today for diabetes f/u.  She was started on Jardiance at her last visit; however, she has been out for more than two weeks. She acknowledges that she did not tell us she was out of medication.   Diabetes She presents for her follow-up diabetic visit. She has type 2 diabetes mellitus. Her disease course has been stable. Hypoglycemia symptoms include dizziness (Started 5 days ago). Pertinent negatives for diabetes include no fatigue, no foot paresthesias, no polydipsia, no polyphagia and no polyuria. There are no hypoglycemic complications. There are no diabetic complications. Risk factors for coronary artery disease include diabetes mellitus, dyslipidemia, hypertension, post-menopausal and sedentary lifestyle. She is compliant with treatment all of the time. She is following a diabetic diet. She participates in exercise intermittently. Her breakfast blood glucose is taken between 8-9 am. Her breakfast blood glucose range is generally 110-130 mg/dl.    Past Medical History:  Diagnosis Date   Breast cancer ([place])    Chronic kidney disease    CKD stage 3   Chronic kidney disease, stage 3 (moderate) 05/20/2017   Diabetes mellitus without complication ([place])    Endometrial hyperplasia 05/20/2017    Glomerular disorders in diseases classified elsewhere 05/20/2017   Hyperlipemia    Hypertension    Hypertensive chronic kidney disease with stage 1 through stage 4 chronic kidney disease, or unspecified chronic kidney disease 05/20/2017   Numbness and tingling of both feet    Type 2 diabetes mellitus with diabetic chronic kidney disease ([place]) 05/20/2017     Family History  Problem Relation Age of Onset   Stroke Mother    Congestive Heart Failure Father      Current Outpatient Medications:    allopurinol (ZYLOPRIM) 100 MG tablet, Take 2 tablets (200 mg total) by mouth daily., Disp: 180 tablet, Rfl: 1   amLODipine (NORVASC) 2.5 MG tablet, Take 1 tablet (2.5 mg total) by mouth at bedtime., Disp: 30 tablet, Rfl: 11   ammonium lactate (AMLACTIN) 12 % cream, Apply topically as needed for dry skin., Disp: 385 g, Rfl: 4   aspirin EC 81 MG tablet, Take 81 mg by mouth daily after breakfast. , Disp: , Rfl:    Calcium Carb-Cholecalciferol (CALCIUM + D3 PO), Take 1 tablet by mouth daily. , Disp: , Rfl:    Cholecalciferol (VITAMIN D) 2000 units tablet, Take 2,000 Units by mouth daily., Disp: , Rfl:    FREESTYLE LITE test strip, Use as instructed to check blood sugars 2 times per day dx: e11.22, Disp: 300 each, Rfl: 2   gabapentin (NEURONTIN) 100 MG capsule, Take 1 capsule (100 mg total) by mouth at bedtime., Disp: 90 capsule, Rfl: 0   Lancets (FREESTYLE) lancets, Use as instructed to check blood sugars 2 times per day dx: e11.22,  Disp: 300 each, Rfl: 2   meclizine (ANTIVERT) 25 MG tablet, Take 1 tablet (25 mg total) by mouth 3 (three) times daily as needed for dizziness., Disp: 30 tablet, Rfl: 0   metFORMIN (GLUCOPHAGE) 500 MG tablet, TAKE 1 TABLET BY MOUTH ONCE DAILY AFTER BREAKFAST, Disp: 90 tablet, Rfl: 1   Multiple Vitamins-Minerals (ONE-A-DAY WOMENS 50 PLUS PO), Take 1 tablet by mouth daily., Disp: , Rfl:    RESTASIS 0.05 % ophthalmic emulsion, 2 times per day, Disp: , Rfl:    rosuvastatin (CRESTOR)  20 MG tablet, Take 1 tablet (20 mg total) by mouth daily., Disp: 30 tablet, Rfl: 3   empagliflozin (JARDIANCE) 10 MG TABS tablet, Take 1 tablet (10 mg total) by mouth daily before breakfast. (Patient not taking: Reported on 04/07/2021), Disp: 90 tablet, Rfl: 1   losartan (COZAAR) 100 MG tablet, TAKE 1 TABLET(100 MG) BY MOUTH DAILY, Disp: 90 tablet, Rfl: 2   Allergies  Allergen Reactions   Percocet [Oxycodone-Acetaminophen] Nausea And Vomiting   Shellfish Allergy Other (See Comments)    Causes Gout     Review of Systems  Constitutional: Negative.  Negative for fatigue.  Respiratory: Negative.    Cardiovascular: Negative.   Gastrointestinal: Negative.   Endocrine: Negative for polydipsia, polyphagia and polyuria.  Neurological:  Positive for dizziness (Started 5 days ago).       She states she has been having dizziness since last Friday.  She denies fall/trauma. She reports she was at home, sitting at her computer, talking to her sister on her phone. She stood up to walk to her couch, and the room started spinning. She denies h/o vertigo.  She denies recent URI. No recent fever/chills/cold sx.   Psychiatric/Behavioral: Negative.    All other systems reviewed and are negative.   Today's Vitals   04/07/21 0936  Temp: 98 F (36.7 C)  TempSrc: Oral  Weight: 206 lb 3.2 oz (93.5 kg)  Height: 5' 5.5" (1.664 m)  PainSc: 0-No pain   Body mass index is 33.79 kg/m.  Wt Readings from Last 3 Encounters:  04/07/21 206 lb 3.2 oz (93.5 kg)  02/01/21 215 lb (97.5 kg)  01/28/21 212 lb (96.2 kg)    BP Readings from Last 3 Encounters:  02/01/21 130/76  01/28/21 122/74  12/29/20 140/82    Objective:  Physical Exam Vitals and nursing note reviewed.  Constitutional:      Appearance: Normal appearance. She is obese.  HENT:     Head: Normocephalic and atraumatic.     Right Ear: Tympanic membrane, ear canal and external ear normal.     Left Ear: Tympanic membrane, ear canal and external ear  normal.     Nose:     Comments: Masked     Mouth/Throat:     Comments: Masked  Cardiovascular:     Rate and Rhythm: Normal rate and regular rhythm.     Heart sounds: Normal heart sounds.  Pulmonary:     Effort: Pulmonary effort is normal.     Breath sounds: Normal breath sounds.  Musculoskeletal:     Cervical back: Normal range of motion.  Skin:    General: Skin is warm.  Neurological:     General: No focal deficit present.     Mental Status: She is alert.  Psychiatric:        Mood and Affect: Mood normal.        Behavior: Behavior normal.        Assessment And Plan:  1. Type 2 diabetes mellitus with stage 3a chronic kidney disease, without long-term current use of insulin ([facility]) Comments: Chronic, I will check labs as listed below. She is encouraged to limit her intake of sweetened beverages, including diet drinks.  - Hemoglobin A1c  2. Hypertensive chronic kidney disease with stage 1 through stage 4 chronic kidney disease, or unspecified chronic kidney disease Comments: Chronic, fair control. No change in meds today.   3. Pure hypercholesterolemia Comments: She was started on rosuvastatin in April 2022 by Dr. Lyda Jester. I will forward her the results.  - Liver Profile - Lipid panel  4. Vertigo Comments: I will send rx meclizine to use prn. I will refer her to ENT should her sx persist.   5. Tachycardia Comments: HR 113 when standing. EKG performed, NSR w/ HR 89 and RAE. She is encouraged to stay well hydrated, aim for at least 60 ounces of water daily.  - EKG 12-Lead  6. Class 1 obesity due to excess calories with serious comorbidity and body mass index (BMI) of 33.0 to 33.9 in adult Comments: She is encouraged to strive for BMI less than 30 to decrease cardiac risk. Advised to aim for at least 150 minutes of exercise per week.   7. Drug therapy - Vitamin B12   Patient was given opportunity to ask questions. Patient verbalized understanding of the plan and was able  to repeat key elements of the plan. All questions were answered to their satisfaction.   I, [name], MD, have reviewed all documentation for this visit. The documentation on 05/16/21 for the exam, diagnosis, procedures, and orders are all accurate and complete.   IF YOU HAVE BEEN REFERRED TO A SPECIALIST, IT MAY TAKE 1-2 WEEKS TO SCHEDULE/PROCESS THE REFERRAL. IF YOU HAVE NOT HEARD FROM US/SPECIALIST IN TWO WEEKS, PLEASE GIVE [place] A CALL AT [phone].   THE PATIENT IS ENCOURAGED TO PRACTICE SOCIAL DISTANCING DUE TO THE COVID-19 PANDEMIC.

## 2021-04-08 LAB — VITAMIN B12: Vitamin B-12: >2000 pg/mL — ABNORMAL HIGH (ref 232–1245)

## 2021-04-08 LAB — HEPATIC FUNCTION PANEL
ALT: 34 IU/L — ABNORMAL HIGH (ref 0–32)
AST: 18 IU/L (ref 0–40)
Albumin: 4.6 g/dL (ref 3.7–4.7)
Alkaline Phosphatase: 115 IU/L (ref 44–121)
Bilirubin Total: 0.6 mg/dL (ref 0.0–1.2)
Bilirubin, Direct: 0.19 mg/dL (ref 0.00–0.40)
Total Protein: 7.4 g/dL (ref 6.0–8.5)

## 2021-04-08 LAB — LIPID PANEL
Chol/HDL Ratio: 3.4 ratio (ref 0.0–4.4)
Cholesterol, Total: 93 mg/dL — ABNORMAL LOW (ref 100–199)
HDL: 27 mg/dL — ABNORMAL LOW (ref >39)
LDL Chol Calc (NIH): 45 mg/dL (ref 0–99)
Triglycerides: 112 mg/dL (ref 0–149)
VLDL Cholesterol Cal: 21 mg/dL (ref 5–40)

## 2021-04-08 LAB — HEMOGLOBIN A1C
Est. average glucose Bld gHb Est-mCnc: 200 mg/dL
Hgb A1c MFr Bld: 8.6 % — ABNORMAL HIGH (ref 4.8–5.6)

## 2021-04-14 DIAGNOSIS — U071 COVID-19: Secondary | ICD-10-CM | POA: Diagnosis not present

## 2021-04-20 ENCOUNTER — Telehealth: Payer: Self-pay

## 2021-04-20 NOTE — Telephone Encounter (Signed)
The pt was notified that samples of Jardiance 10 mg is ready for pickup.

## 2021-04-25 DIAGNOSIS — Z20822 Contact with and (suspected) exposure to covid-19: Secondary | ICD-10-CM | POA: Diagnosis not present

## 2021-05-15 ENCOUNTER — Other Ambulatory Visit: Payer: Self-pay | Admitting: Internal Medicine

## 2021-05-18 ENCOUNTER — Other Ambulatory Visit: Payer: Self-pay

## 2021-05-18 ENCOUNTER — Encounter: Payer: Self-pay | Admitting: Internal Medicine

## 2021-05-18 ENCOUNTER — Ambulatory Visit (INDEPENDENT_AMBULATORY_CARE_PROVIDER_SITE_OTHER): Payer: Medicare Other | Admitting: Internal Medicine

## 2021-05-18 VITALS — BP 120/76 | HR 90 | Temp 98.2°F | Ht 65.5 in | Wt 207.8 lb

## 2021-05-18 DIAGNOSIS — N1831 Chronic kidney disease, stage 3a: Secondary | ICD-10-CM | POA: Diagnosis not present

## 2021-05-18 DIAGNOSIS — Z87891 Personal history of nicotine dependence: Secondary | ICD-10-CM | POA: Diagnosis not present

## 2021-05-18 DIAGNOSIS — E1122 Type 2 diabetes mellitus with diabetic chronic kidney disease: Secondary | ICD-10-CM | POA: Diagnosis not present

## 2021-05-18 DIAGNOSIS — I2510 Atherosclerotic heart disease of native coronary artery without angina pectoris: Secondary | ICD-10-CM | POA: Diagnosis not present

## 2021-05-18 DIAGNOSIS — Z6834 Body mass index (BMI) 34.0-34.9, adult: Secondary | ICD-10-CM

## 2021-05-18 DIAGNOSIS — E6609 Other obesity due to excess calories: Secondary | ICD-10-CM | POA: Diagnosis not present

## 2021-05-18 LAB — BMP8+EGFR
BUN/Creatinine Ratio: 14 (ref 12–28)
BUN: 19 mg/dL (ref 8–27)
CO2: 24 mmol/L (ref 20–29)
Calcium: 10.3 mg/dL (ref 8.7–10.3)
Chloride: 101 mmol/L (ref 96–106)
Creatinine, Ser: 1.35 mg/dL — ABNORMAL HIGH (ref 0.57–1.00)
Glucose: 142 mg/dL — ABNORMAL HIGH (ref 65–99)
Potassium: 4.3 mmol/L (ref 3.5–5.2)
Sodium: 140 mmol/L (ref 134–144)
eGFR: 41 mL/min/{1.73_m2} — ABNORMAL LOW (ref >59)

## 2021-05-18 MED ORDER — EMPAGLIFLOZIN 10 MG PO TABS: 10.0000 mg | ORAL_TABLET | Freq: Every day | ORAL | 1 refills | Status: DC

## 2021-05-18 NOTE — Progress Notes (Signed)
I,[name],acting as a Education administrator for [name], MD.,have documented all relevant documentation on the behalf of [name], MD,as directed by  [name], MD while in the presence of [name], MD.  This visit occurred during the SARS-CoV-2 public health emergency.  Safety protocols were in place, including screening questions prior to the visit, additional usage of staff PPE, and extensive cleaning of exam room while observing appropriate contact time as indicated for disinfecting solutions.  Subjective:     Patient ID: Tammy Preston , female    DOB: 1946/02/03 , 75 y.o.   MRN: [id number]   Chief Complaint  Patient presents with   Diabetes    HPI  She presents today for diabetes f/u. The patient was recently started on Jardiance 49m daily. She has not had any issues with the medication - no dizziness, chest pain and shortness of breath.   Diabetes She presents for her follow-up diabetic visit. She has type 2 diabetes mellitus. Her disease course has been stable. Pertinent negatives for hypoglycemia include no dizziness. Pertinent negatives for diabetes include no fatigue, no foot paresthesias, no polydipsia, no polyphagia and no polyuria. There are no hypoglycemic complications. There are no diabetic complications. Risk factors for coronary artery disease include diabetes mellitus, dyslipidemia, hypertension, post-menopausal and sedentary lifestyle. She is compliant with treatment all of the time. She is following a diabetic diet. She participates in exercise intermittently. Her breakfast blood glucose is taken between 8-9 am. Her breakfast blood glucose range is generally 110-130 mg/dl.    Past Medical History:  Diagnosis Date   Breast cancer ([facility]    Chronic kidney disease    CKD stage 3   Chronic kidney disease, stage 3 (moderate) 05/20/2017   Diabetes [place] without complication [place] [place]    Endometrial hyperplasia 05/20/2017   Glomerular disorders in  diseases classified elsewhere 05/20/2017   Hyperlipemia    Hypertension    Hypertensive chronic kidney disease with stage 1 through stage 4 chronic kidney disease, or unspecified chronic kidney disease 05/20/2017   Numbness and tingling of both feet    Type 2 diabetes mellitus with diabetic chronic kidney disease ([facility] 05/20/2017     Family History  Problem Relation Age of Onset   Stroke Mother    Congestive Heart Failure Father      Current Outpatient Medications:    allopurinol (ZYLOPRIM) 100 MG tablet, Take 2 tablets (200 mg total) by mouth daily., Disp: 180 tablet, Rfl: 1   amLODipine (NORVASC) 2.5 MG tablet, Take 1 tablet (2.5 mg total) by mouth at bedtime., Disp: 30 tablet, Rfl: 11   aspirin EC 81 MG tablet, Take 81 mg by mouth daily after breakfast. , Disp: , Rfl:    Calcium Carb-Cholecalciferol (CALCIUM + D3 PO), Take 1 tablet by mouth daily. , Disp: , Rfl:    Cholecalciferol (VITAMIN D) 2000 units tablet, Take 2,000 Units by mouth daily., Disp: , Rfl:    FREESTYLE LITE test strip, Use as instructed to check blood sugars 2 times per day dx: e11.22, Disp: 300 each, Rfl: 2   gabapentin (NEURONTIN) 100 MG capsule, Take 1 capsule (100 mg total) by mouth at bedtime., Disp: 90 capsule, Rfl: 0   Lancets (FREESTYLE) lancets, Use as instructed to check blood sugars 2 times per day dx: e11.22, Disp: 300 each, Rfl: 2   losartan (COZAAR) 100 MG tablet, TAKE 1 TABLET(100 MG) BY MOUTH DAILY, Disp: 90 tablet, Rfl: 2   metFORMIN (GLUCOPHAGE) 500 MG  tablet, TAKE 1 TABLET BY MOUTH EVERY DAY AFTER BREAKFAST, Disp: 90 tablet, Rfl: 1   Multiple Vitamins-Minerals (ONE-A-DAY WOMENS 50 PLUS PO), Take 1 tablet by mouth daily., Disp: , Rfl:    RESTASIS 0.05 % ophthalmic emulsion, 2 times per day, Disp: , Rfl:    rosuvastatin (CRESTOR) 20 MG tablet, Take 1 tablet (20 mg total) by mouth daily., Disp: 30 tablet, Rfl: 3   ammonium lactate (AMLACTIN) 12 % cream, Apply topically as needed for dry skin. (Patient  not taking: Reported on 05/18/2021), Disp: 385 g, Rfl: 4   empagliflozin (JARDIANCE) 10 MG TABS tablet, Take 1 tablet (10 mg total) by mouth daily before breakfast., Disp: 90 tablet, Rfl: 1   meclizine (ANTIVERT) 25 MG tablet, Take 1 tablet (25 mg total) by mouth 3 (three) times daily as needed for dizziness. (Patient not taking: Reported on 05/18/2021), Disp: 30 tablet, Rfl: 0   Allergies  Allergen Reactions   Percocet [Oxycodone-Acetaminophen] Nausea And Vomiting   Shellfish Allergy Other (See Comments)    Causes Gout     Review of Systems  Constitutional: Negative.  Negative for fatigue.  Respiratory: Negative.    Cardiovascular: Negative.   Gastrointestinal: Negative.   Endocrine: Negative for polydipsia, polyphagia and polyuria.  Neurological:  Negative for dizziness.  Psychiatric/Behavioral: Negative.    All other systems reviewed and are negative.   Today's Vitals   05/18/21 0910  BP: 120/76  Pulse: 90  Temp: 98.2 F (36.8 C)  TempSrc: Oral  Weight: 207 lb 12.8 oz (94.3 kg)  Height: 5' 5.5" (1.664 m)   Body mass index is 34.05 kg/m.  Wt Readings from Last 3 Encounters:  05/18/21 207 lb 12.8 oz (94.3 kg)  04/07/21 206 lb 3.2 oz (93.5 kg)  02/01/21 215 lb (97.5 kg)    BP Readings from Last 3 Encounters:  05/18/21 120/76  02/01/21 130/76  01/28/21 122/74    Objective:  Physical Exam Vitals and nursing note reviewed.  Constitutional:      Appearance: Normal appearance.  HENT:     Head: Normocephalic and atraumatic.     Nose:     Comments: Masked     Mouth/Throat:     Comments: Masked  Cardiovascular:     Rate and Rhythm: Normal rate and regular rhythm.     Heart sounds: Normal heart sounds.  Pulmonary:     Effort: Pulmonary effort is normal.     Breath sounds: Normal breath sounds.  Skin:    General: Skin is warm.  Neurological:     General: No focal deficit present.     Mental Status: She is alert.  Psychiatric:        Mood and Affect: Mood normal.         Behavior: Behavior normal.        Assessment And Plan:     1. Type 2 diabetes mellitus with stage 3a chronic kidney disease, without long-term current use of insulin (HCC) Comments: Chronic. I will check renal function since she was just started on Jardiance 10m daily. She will rto in 2-3 months for her next hba1c.  - BMP8+eGFR - AMB Referral [facility] 2. Class 1 obesity due to excess calories with serious comorbidity and body mass index (BMI) of 34.0 to 34.9 in adult Comments: She is encouraged to aim for at least 150 minutes of exercise per week.    Patient was given opportunity to ask questions. Patient verbalized understanding of the plan and  was able to repeat key elements of the plan. All questions were answered to their satisfaction.   I, [name], MD, have reviewed all documentation for this visit. The documentation on 05/18/21 for the exam, diagnosis, procedures, and orders are all accurate and complete.   IF YOU HAVE BEEN REFERRED TO A SPECIALIST, IT MAY TAKE 1-2 WEEKS TO SCHEDULE/PROCESS THE REFERRAL. IF YOU HAVE NOT HEARD FROM US/SPECIALIST IN TWO WEEKS, PLEASE GIVE [place] A CALL AT [phone].   THE PATIENT IS ENCOURAGED TO PRACTICE SOCIAL DISTANCING DUE TO THE COVID-19 PANDEMIC.

## 2021-05-18 NOTE — Patient Instructions (Signed)

## 2021-05-23 ENCOUNTER — Other Ambulatory Visit: Payer: Self-pay | Admitting: Internal Medicine

## 2021-05-26 DIAGNOSIS — Z20822 Contact with and (suspected) exposure to covid-19: Secondary | ICD-10-CM | POA: Diagnosis not present

## 2021-05-27 ENCOUNTER — Telehealth: Payer: Self-pay | Admitting: Internal Medicine

## 2021-05-27 ENCOUNTER — Telehealth: Payer: Self-pay | Admitting: *Deleted

## 2021-05-27 MED ORDER — ROSUVASTATIN CALCIUM 20 MG PO TABS: 20.0000 mg | ORAL_TABLET | Freq: Every day | ORAL | 1 refills | Status: DC

## 2021-05-27 NOTE — Telephone Encounter (Signed)
Sent as requested, patient aware  

## 2021-05-27 NOTE — Chronic Care Management (AMB) (Signed)
  Chronic Care Management   Note  05/27/2021 Name: Tammy Preston MRN: [id number] DOB: 02-Oct-1946  Tammy Preston is a 75 y.o. year old female who is a primary care patient of Glendale Chard, MD. I reached out to Retia Passe by phone today in response to a referral sent by [name] PCP, Dr. Baird Cancer.      [name] was given information about Chronic Care Management services today including:  CCM service includes personalized support from designated clinical staff supervised by her physician, including individualized plan of care and coordination with other care providers 24/7 contact phone numbers for assistance for urgent and routine care needs. Service will only be billed when office clinical staff spend 20 minutes or more in a month to coordinate care. Only one practitioner may furnish and bill the service in a calendar month. The patient may stop CCM services at any time (effective at the end of the month) by phone call to the office staff. The patient will be responsible for cost sharing (co-pay) of up to 20% of the service fee (after annual deductible is met).  Patient did not agree to enrollment in care management services and does not wish to consider at this time.  Follow up plan: Patient declines  engagement by the care management team. Appropriate care team members and provider have been notified via electronic communication. The care management team is available to follow up with the patient after provider conversation with the patient regarding recommendation for care management engagement and subsequent re-referral to the care management team.   [facility]  Direct Dial: [phone]

## 2021-05-27 NOTE — Telephone Encounter (Signed)
*  STAT* If patient is at the pharmacy, call can be transferred to refill team.   1. Which medications need to be refilled? (please list name of each medication and dose if known)   rosuvastatin (CRESTOR) 20 MG tablet   2. Which pharmacy/location (including street and city if local pharmacy) is medication to be sent to? [id number] - [place], [place] - [address] AT [address]  3. Do they need a 30 day or 90 day supply? 90  

## 2021-05-27 NOTE — Chronic Care Management (AMB) (Signed)
  Chronic Care Management   Outreach Note  05/27/2021 Name: Tammy Preston MRN: [id number] DOB: February 17, 1946  Tammy Preston is a 75 y.o. year old female who is a primary care patient of Glendale Chard, MD. I reached out to Tammy Preston by phone today in response to a referral sent by Ms. Ofilia Neas [place]'s PCP, Dr. Baird Cancer.      An unsuccessful telephone outreach was attempted today. The patient was referred to the case management team for assistance with care management and care coordination.   Follow Up Plan: A HIPAA compliant phone message was left for the patient providing contact information and requesting a return call. The care management team will reach out to the patient again over the next 7 days.  If patient returns call to provider office, please advise to call [place] at [phone].  [facility] Management  Direct Dial: [phone]

## 2021-06-01 DIAGNOSIS — Z1211 Encounter for screening for malignant neoplasm of colon: Secondary | ICD-10-CM | POA: Diagnosis not present

## 2021-06-01 DIAGNOSIS — E669 Obesity, unspecified: Secondary | ICD-10-CM | POA: Diagnosis not present

## 2021-06-01 DIAGNOSIS — K5730 Diverticulosis of large intestine without perforation or abscess without bleeding: Secondary | ICD-10-CM | POA: Diagnosis not present

## 2021-06-01 DIAGNOSIS — Z86010 Personal history of colonic polyps: Secondary | ICD-10-CM | POA: Diagnosis not present

## 2021-06-01 LAB — CBC: RBC: 4.94 (ref 3.87–5.11)

## 2021-06-01 LAB — TSH: TSH: 2.26 (ref 0.41–5.90)

## 2021-06-01 LAB — CBC AND DIFFERENTIAL
HCT: 43 (ref 36–46)
Hemoglobin: 14.5 (ref 12.0–16.0)
Platelets: 13 — AB (ref 150–399)
WBC: 14.9

## 2021-06-02 DIAGNOSIS — Z1231 Encounter for screening mammogram for malignant neoplasm of breast: Secondary | ICD-10-CM | POA: Diagnosis not present

## 2021-06-02 LAB — HM MAMMOGRAPHY

## 2021-06-07 ENCOUNTER — Encounter: Payer: Self-pay | Admitting: Internal Medicine

## 2021-06-08 ENCOUNTER — Ambulatory Visit (INDEPENDENT_AMBULATORY_CARE_PROVIDER_SITE_OTHER): Payer: Medicare Other | Admitting: Podiatry

## 2021-06-08 ENCOUNTER — Encounter: Payer: Self-pay | Admitting: Podiatry

## 2021-06-08 ENCOUNTER — Other Ambulatory Visit: Payer: Self-pay

## 2021-06-08 DIAGNOSIS — B351 Tinea unguium: Secondary | ICD-10-CM

## 2021-06-08 DIAGNOSIS — Q828 Other specified congenital malformations of skin: Secondary | ICD-10-CM | POA: Diagnosis not present

## 2021-06-08 DIAGNOSIS — M79675 Pain in left toe(s): Secondary | ICD-10-CM | POA: Diagnosis not present

## 2021-06-08 DIAGNOSIS — E1149 Type 2 diabetes mellitus with other diabetic neurological complication: Secondary | ICD-10-CM

## 2021-06-08 DIAGNOSIS — M79674 Pain in right toe(s): Secondary | ICD-10-CM

## 2021-06-09 ENCOUNTER — Encounter: Payer: Self-pay | Admitting: Internal Medicine

## 2021-06-14 ENCOUNTER — Ambulatory Visit: Payer: Medicare Other | Admitting: Internal Medicine

## 2021-06-15 NOTE — Progress Notes (Signed)
Subjective: 75 y.o. returns the office today for painful, elongated, thickened toenails which she cannot trim herself. Denies any redness or drainage around the nails.  States she is doing well and not having any significant foot pain.  No open sores.  She has no other concerns today.  PCP: Glendale Chard, MD Last see: April 18, 2021 Last A1c was 8.6 on April 07, 2021  Objective: AAO 3, NAD DP/PT pulses palpable, CRT less than 3 seconds Nails hypertrophic, dystrophic, elongated, brittle, discolored 10. There is tenderness overlying the nails 1-5 bilaterally. There is no surrounding erythema or drainage along the nail sites. Mild hyperkeratotic tissue right submetatarsal 5.  No underlying ulceration drainage or signs of infection.  No other open lesions or preulcerative lesions. No open lesions or pre-ulcerative lesions are identified. No pain with calf compression, swelling, warmth, erythema.  Assessment: Patient presents with symptomatic onychomycosis, hyperkeratotic lesions, neuropathy  Plan: -Treatment options including alternatives, risks, complications were discussed -Nails sharply debrided 10 without complication/bleeding. -Hyperkeratotic lesion sharply debrided x1 without any complications or bleeding. -Continue gabapentin.  -Discussed daily foot inspection. If there are any changes, to call the office immediately.  -Follow-up in 3 months or sooner if any problems are to arise. In the meantime, encouraged to call the office with any questions, concerns, changes symptoms.  Celesta Gentile, DPM

## 2021-06-16 ENCOUNTER — Ambulatory Visit (INDEPENDENT_AMBULATORY_CARE_PROVIDER_SITE_OTHER): Payer: Medicare Other | Admitting: Internal Medicine

## 2021-06-16 ENCOUNTER — Other Ambulatory Visit: Payer: Self-pay

## 2021-06-16 VITALS — BP 124/72 | HR 94 | Ht 66.0 in | Wt 206.8 lb

## 2021-06-16 DIAGNOSIS — R5383 Other fatigue: Secondary | ICD-10-CM

## 2021-06-16 DIAGNOSIS — I2510 Atherosclerotic heart disease of native coronary artery without angina pectoris: Secondary | ICD-10-CM

## 2021-06-16 DIAGNOSIS — I2584 Coronary atherosclerosis due to calcified coronary lesion: Secondary | ICD-10-CM

## 2021-06-16 DIAGNOSIS — N1832 Chronic kidney disease, stage 3b: Secondary | ICD-10-CM | POA: Diagnosis not present

## 2021-06-16 DIAGNOSIS — R5381 Other malaise: Secondary | ICD-10-CM | POA: Diagnosis not present

## 2021-06-16 DIAGNOSIS — I10 Essential (primary) hypertension: Secondary | ICD-10-CM | POA: Diagnosis not present

## 2021-06-16 NOTE — Progress Notes (Signed)
Cardiology Office Note:    Date:  06/16/2021   ID:  Tammy Preston, DOB 1946/03/23, MRN [id number]  PCP:  Glendale Chard, MD  Cardiologist:  None  Electrophysiologist:  None   Referring MD: Glendale Chard, MD   Chief Complaint/Reason for Referral: Follow-up coronary artery calcifications  History of Present Illness:    Tammy Preston is a 75 y.o. female with a history of breast cancer, CKD stage 3, diabetes mellitus type 2, hyperlipemia, and hypertension, here for follow-up. I last saw her 01/28/2021.  Today: She was feeling fine until 03/2021 when she developed vertigo. This lasted for 4 days and she visited her PMD. Her dizziness improved with meclizine over a 3 week recovery period, during which she was lying down most of the time due to fatigue, drowsiness from medication, and fear of falling. Since then she has not experienced recurrent vertigo.  Now, she "doesn't walk like she used to walk." Overall, her lower extremities feel weaker and she walks at a slower pace. She can no longer walk as far as she used to without becoming fatigued. For longer distances, she uses a cane because she is fearful of falling, her legs feel weaker. Of note, she is able to walk if she did not have her cane, but she would still be limited by fatigue. She denies any shortness of breath. No exertional chest pain or pressure. She is not concerned that symptoms are related to her heart.  She drinks plenty of fluids throughout the day (64+ oz of water), and typically wakes up to use the restroom 3-4 times a night. Also she drinks 2 cups of coffee a day. No palpitations.  Her regimen includes crestor and jardiance, which she seems to be tolerating. She follows-up with her PCP in 07/2021.  During physical examination today, she complains of some neck pain from moving her head to the side. No trigger for vertigo.  She denies any palpitations, or chest pain. No headaches, syncope, orthopnea, or PND. Also has no  lower extremity edema.   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 kidney disease, or unspecified chronic kidney disease 05/20/2017   Numbness and tingling of both feet    Type 2 diabetes mellitus with diabetic chronic kidney disease ([place]) 05/20/2017    Past Surgical History:  Procedure Laterality Date   BACK SURGERY     BREAST LUMPECTOMY Left    COLONOSCOPY     DILATATION & CURETTAGE/HYSTEROSCOPY WITH MYOSURE N/A 04/07/2017   Procedure: DILATATION & CURETTAGE/HYSTEROSCOPY WITH MYOSURE;  Surgeon: Servando Salina, MD;  Location: [facility];  Service: Gynecology;  Laterality: N/A;   KNEE SURGERY      Current Medications: Current Meds  Medication Sig   allopurinol (ZYLOPRIM) 100 MG tablet Take 2 tablets (200 mg total) by mouth daily.   amLODipine (NORVASC) 2.5 MG tablet Take 1 tablet (2.5 mg total) by mouth at bedtime.   ammonium lactate (AMLACTIN) 12 % cream Apply topically as needed for dry skin.   aspirin EC 81 MG tablet Take 81 mg by mouth daily after breakfast.    BINAXNOW COVID-19  AG HOME TEST KIT Use as Directed on the Package   Calcium Carb-Cholecalciferol (CALCIUM + D3 PO) Take 1 tablet by mouth daily.    Cholecalciferol (VITAMIN D) 2000 units tablet Take 2,000 Units by mouth daily.   empagliflozin (JARDIANCE) 10 MG TABS tablet Take 1 tablet (10 mg total) by mouth daily before breakfast.   FREESTYLE LITE test strip Use as instructed to check blood sugars 2 times per day dx: e11.22   gabapentin (NEURONTIN) 100 MG capsule Take 1 capsule (100 mg total) by mouth at bedtime.   Lancets (FREESTYLE) lancets Use as  instructed to check blood sugars 2 times per day dx: e11.22   losartan (COZAAR) 100 MG tablet TAKE 1 TABLET(100 MG) BY MOUTH DAILY   metFORMIN (GLUCOPHAGE) 500 MG tablet TAKE 1 TABLET BY MOUTH EVERY DAY AFTER BREAKFAST   Multiple Vitamins-Minerals (ONE-A-DAY WOMENS 50 PLUS PO) Take 1 tablet by mouth daily.   RESTASIS 0.05 % ophthalmic emulsion 2 times per day   rosuvastatin (CRESTOR) 20 MG tablet Take 1 tablet (20 mg total) by mouth daily.   Sod Picosulfate-Mag Ox-Cit Acd (CLENPIQ) 10-3.5-12 MG-GM -GM/160ML SOLN    [DISCONTINUED] meclizine (ANTIVERT) 25 MG tablet Take 1 tablet (25 mg total) by mouth 3 (three) times daily as needed for dizziness.     Allergies:   Percocet [oxycodone-acetaminophen] and Shellfish allergy   Social History   Tobacco Use   Smoking status: Former    Packs/day: 0.75    Years: 54.00    Pack years: 40.50    Types: Cigarettes    Start date: 10/24/1965   Smokeless tobacco: Never  Vaping Use   Vaping Use: Never used  Substance Use Topics   Alcohol use: No    Alcohol/week: 0.0 standard drinks   Drug use: No     Family History: The patient's family history includes Congestive Heart Failure in her father; Stroke in her mother.  ROS:   Please see the history of present illness.    (+) Bilateral LE weakness (+) Fatigue (+) Gait instability/Imbalance (+) Neck pain All other systems reviewed and are negative.  EKGs/Labs/Other Studies Reviewed:    The following studies were reviewed today: No prior CV studies available.  EKG:   06/16/2021: EKG is not ordered today. 01/28/2021: NSR, LAE  I have independently reviewed the images from CT chest 01/08/21 - coronary artery calcifications.  Recent Labs: 04/07/2021: ALT 34 05/18/2021: BUN 19; Creatinine, Ser 1.35; Potassium 4.3; Sodium 140 06/01/2021: Hemoglobin 14.5; Platelets 13; TSH 2.26  Recent Lipid Panel    Component Value Date/Time   CHOL 93 (L) 04/07/2021 1037   TRIG 112 04/07/2021 1037   HDL 27 (L)  04/07/2021 1037   CHOLHDL 3.4 04/07/2021 1037   LDLCALC 45 04/07/2021 1037    Physical Exam:    VS:  BP 124/72   Pulse 94   Ht 5' 6"  (1.676 m)   Wt 206 lb 12.8 oz (93.8 kg)   SpO2 100%   BMI 33.38 kg/m     Wt Readings from Last 5 Encounters:  06/16/21 206 lb 12.8 oz (93.8 kg)  05/18/21 207 lb 12.8 oz (94.3 kg)  04/07/21 206 lb 3.2 oz (93.5 kg)  02/01/21 215 lb (97.5 kg)  01/28/21 212 lb (96.2 kg)    Constitutional: No acute distress Eyes: sclera non-icteric, normal conjunctiva and lids ENMT: normal dentition, moist mucous membranes Cardiovascular: regular rhythm, normal rate, no murmurs. S1 and S2 normal. Radial pulses normal bilaterally. No jugular venous distention.  Respiratory: clear to auscultation bilaterally GI : normal bowel sounds, soft and nontender. No distention.   MSK: extremities warm, well perfused. No edema.  NEURO: grossly nonfocal exam, moves all extremities. PSYCH: alert and oriented x 3, normal mood and affect.   ASSESSMENT:    1. Fatigue, unspecified type   2. Physical deconditioning   3. Stage 3b chronic kidney disease ([place])   4. Coronary artery calcification   5. Primary hypertension    PLAN:    Fatigue, unspecified type Physical deconditioning - She denies CP or SOB and feels this is all secondary to the episode of vertigo. Suspect deconditioning. She will try to increase activity level and if symptoms occur, I would recommend echocardiogram at a minimum to evaluate if symptoms could be post viral and also evaluate structure and function of heart.   Coronary artery calcification - from CT chest 01/08/21 - tolerating Crestor 20 mg daily. Lipids optimized.  - continue ASA 81 mg daily. - also on Jardience 10 mg daily for DM2.   HTN - continue amlodipine 2.5 mg, losartan 100 mg daily. BP well controlled.   Follow-up in 3 months.   Total time of encounter: 30 minutes total time of encounter, including 20 minutes spent in face-to-face patient  care on the date of this encounter. This time includes coordination of care and counseling regarding above mentioned problem list. Remainder of non-face-to-face time involved reviewing chart documents/testing relevant to the patient encounter and documentation in the medical record. I have independently reviewed documentation from referring provider.   Cherlynn Kaiser, MD, [place]   Shared Decision Making/Informed Consent:       Medication Adjustments/Labs and Tests Ordered: Current medicines are reviewed at length with the patient today.  Concerns regarding medicines are outlined above.   No orders of the defined types were placed in this encounter.   No orders of the defined types were placed in this encounter.   Patient Instructions  Medication Instructions:  No Changes In Medications at this time.  *If you need a refill on your cardiac medications before your next appointment, please call your pharmacy*  Follow-Up: At [facility], you and your health needs are our priority.  As part of our continuing mission to provide you with exceptional heart care, we have created designated Provider Care Teams.  These Care Teams include your primary Cardiologist (physician) and Advanced Practice Providers (APPs -  Physician Assistants and Nurse Practitioners) who all work together to provide you with the care you need, when you need it.  We recommend signing up for the patient portal called "MyChart".  Sign up information is provided on this After Visit Summary.  MyChart is used to connect with patients for Virtual Visits (Telemedicine).  Patients are able to view lab/test results, encounter notes, upcoming appointments, etc.  Non-urgent messages can be sent to your provider as well.   To learn more about what you can do with MyChart, go to [email or web address].    Your next appointment:   November 8th at 8:20  The format for your next appointment:   In  Person  Provider:   Cherlynn Kaiser, MD    [facility],acting as a scribe for Elouise Munroe, MD.,have documented all relevant documentation on the behalf of Elouise Munroe, MD,as directed by  Elouise Munroe, MD while in the presence of Elouise Munroe, MD.  I, Elouise Munroe, MD, have reviewed all documentation for this visit. The documentation on  06/16/21 for the exam, diagnosis, procedures, and orders are all accurate and complete.

## 2021-06-16 NOTE — Patient Instructions (Signed)
Medication Instructions:  No Changes In Medications at this time.  *If you need a refill on your cardiac medications before your next appointment, please call your pharmacy*  Follow-Up: At [facility], you and your health needs are our priority.  As part of our continuing mission to provide you with exceptional heart care, we have created designated Provider Care Teams.  These Care Teams include your primary Cardiologist (physician) and Advanced Practice Providers (APPs -  Physician Assistants and Nurse Practitioners) who all work together to provide you with the care you need, when you need it.  We recommend signing up for the patient portal called "MyChart".  Sign up information is provided on this After Visit Summary.  MyChart is used to connect with patients for Virtual Visits (Telemedicine).  Patients are able to view lab/test results, encounter notes, upcoming appointments, etc.  Non-urgent messages can be sent to your provider as well.   To learn more about what you can do with MyChart, go to [email or web address].    Your next appointment:   November 8th at 8:20  The format for your next appointment:   In Person  Provider:   Cherlynn Kaiser, MD

## 2021-07-02 ENCOUNTER — Other Ambulatory Visit: Payer: Self-pay | Admitting: Podiatry

## 2021-07-02 ENCOUNTER — Other Ambulatory Visit: Payer: Self-pay | Admitting: Internal Medicine

## 2021-07-14 ENCOUNTER — Encounter: Payer: Self-pay | Admitting: Internal Medicine

## 2021-07-14 DIAGNOSIS — K635 Polyp of colon: Secondary | ICD-10-CM | POA: Diagnosis not present

## 2021-07-14 DIAGNOSIS — Z86010 Personal history of colonic polyps: Secondary | ICD-10-CM | POA: Diagnosis not present

## 2021-07-14 DIAGNOSIS — D122 Benign neoplasm of ascending colon: Secondary | ICD-10-CM | POA: Diagnosis not present

## 2021-07-14 DIAGNOSIS — Z1211 Encounter for screening for malignant neoplasm of colon: Secondary | ICD-10-CM | POA: Diagnosis not present

## 2021-07-14 DIAGNOSIS — K5730 Diverticulosis of large intestine without perforation or abscess without bleeding: Secondary | ICD-10-CM | POA: Diagnosis not present

## 2021-07-14 LAB — HM COLONOSCOPY

## 2021-07-21 ENCOUNTER — Other Ambulatory Visit: Payer: Self-pay

## 2021-07-21 MED ORDER — EMPAGLIFLOZIN 10 MG PO TABS: 10.0000 mg | ORAL_TABLET | Freq: Every day | ORAL | 1 refills | Status: DC

## 2021-07-28 ENCOUNTER — Ambulatory Visit (INDEPENDENT_AMBULATORY_CARE_PROVIDER_SITE_OTHER): Payer: Medicare Other

## 2021-07-28 ENCOUNTER — Other Ambulatory Visit: Payer: Self-pay

## 2021-07-28 ENCOUNTER — Ambulatory Visit (INDEPENDENT_AMBULATORY_CARE_PROVIDER_SITE_OTHER): Payer: Medicare Other | Admitting: Internal Medicine

## 2021-07-28 ENCOUNTER — Encounter: Payer: Self-pay | Admitting: Internal Medicine

## 2021-07-28 VITALS — BP 109/60 | HR 100 | Temp 98.0°F | Ht 65.6 in | Wt 203.7 lb

## 2021-07-28 VITALS — BP 109/60 | HR 100 | Temp 98.0°F | Ht 65.6 in | Wt 203.6 lb

## 2021-07-28 DIAGNOSIS — E1122 Type 2 diabetes mellitus with diabetic chronic kidney disease: Secondary | ICD-10-CM | POA: Diagnosis not present

## 2021-07-28 DIAGNOSIS — I2510 Atherosclerotic heart disease of native coronary artery without angina pectoris: Secondary | ICD-10-CM

## 2021-07-28 DIAGNOSIS — E6609 Other obesity due to excess calories: Secondary | ICD-10-CM | POA: Diagnosis not present

## 2021-07-28 DIAGNOSIS — N1831 Chronic kidney disease, stage 3a: Secondary | ICD-10-CM

## 2021-07-28 DIAGNOSIS — Z0000 Encounter for general adult medical examination without abnormal findings: Secondary | ICD-10-CM | POA: Diagnosis not present

## 2021-07-28 DIAGNOSIS — Z6833 Body mass index (BMI) 33.0-33.9, adult: Secondary | ICD-10-CM

## 2021-07-28 DIAGNOSIS — I129 Hypertensive chronic kidney disease with stage 1 through stage 4 chronic kidney disease, or unspecified chronic kidney disease: Secondary | ICD-10-CM | POA: Diagnosis not present

## 2021-07-28 DIAGNOSIS — M1A3790 Chronic gout due to renal impairment, unspecified ankle and foot, without tophus (tophi): Secondary | ICD-10-CM

## 2021-07-28 DIAGNOSIS — I2584 Coronary atherosclerosis due to calcified coronary lesion: Secondary | ICD-10-CM | POA: Diagnosis not present

## 2021-07-28 LAB — POCT URINALYSIS DIPSTICK
Bilirubin, UA: NEGATIVE
Glucose, UA: POSITIVE — AB
Ketones, UA: NEGATIVE
Leukocytes, UA: NEGATIVE
Nitrite, UA: NEGATIVE
Protein, UA: NEGATIVE
Spec Grav, UA: 1.015 (ref 1.010–1.025)
Urobilinogen, UA: 0.2 E.U./dL
pH, UA: 5.5 (ref 5.0–8.0)

## 2021-07-28 LAB — POCT UA - MICROALBUMIN
Albumin/Creatinine Ratio, Urine, POC: <30
Creatinine, POC: 300 mg/dL
Microalbumin Ur, POC: 30 mg/L

## 2021-07-28 MED ORDER — FREESTYLE LANCETS MISC: 2 refills | Status: DC

## 2021-07-28 MED ORDER — AMLODIPINE BESYLATE 2.5 MG PO TABS: 2.5000 mg | ORAL_TABLET | Freq: Every day | ORAL | 2 refills | Status: DC

## 2021-07-28 MED ORDER — ALLOPURINOL 100 MG PO TABS: 200.0000 mg | ORAL_TABLET | Freq: Every day | ORAL | 1 refills | Status: DC

## 2021-07-28 MED ORDER — FREESTYLE LITE TEST VI STRP: ORAL_STRIP | 2 refills | Status: DC

## 2021-07-28 NOTE — Patient Instructions (Signed)
Diabetes Mellitus and Nutrition, Adult When you have diabetes, or diabetes mellitus, it is very important to have healthy eating habits because your blood sugar (glucose) levels are greatly affected by what you eat and drink. Eating healthy foods in the right amounts, at about the same times every day, can help you:  Control your blood glucose.  Lower your risk of heart disease.  Improve your blood pressure.  Reach or maintain a healthy weight. What can affect my meal plan? Every person with diabetes is different, and each person has different needs for a meal plan. Your health care provider may recommend that you work with a dietitian to make a meal plan that is best for you. Your meal plan may vary depending on factors such as:  The calories you need.  The medicines you take.  Your weight.  Your blood glucose, blood pressure, and cholesterol levels.  Your activity level.  Other health conditions you have, such as heart or kidney disease. How do carbohydrates affect me? Carbohydrates, also called carbs, affect your blood glucose level more than any other type of food. Eating carbs naturally raises the amount of glucose in your blood. Carb counting is a method for keeping track of how many carbs you eat. Counting carbs is important to keep your blood glucose at a healthy level, especially if you use insulin or take certain oral diabetes medicines. It is important to know how many carbs you can safely have in each meal. This is different for every person. Your dietitian can help you calculate how many carbs you should have at each meal and for each snack. How does alcohol affect me? Alcohol can cause a sudden decrease in blood glucose (hypoglycemia), especially if you use insulin or take certain oral diabetes medicines. Hypoglycemia can be a life-threatening condition. Symptoms of hypoglycemia, such as sleepiness, dizziness, and confusion, are similar to symptoms of having too much  alcohol.  Do not drink alcohol if: ? Your health care provider tells you not to drink. ? You are pregnant, may be pregnant, or are planning to become pregnant.  If you drink alcohol: ? Do not drink on an empty stomach. ? Limit how much you use to:  0-1 drink a day for women.  0-2 drinks a day for men. ? Be aware of how much alcohol is in your drink. In the [place] one drink equals one 12 oz bottle of beer (355 mL), one 5 oz glass of wine (148 mL), or one 1 oz glass of hard liquor (44 mL). ? Keep yourself hydrated with water, diet soda, or unsweetened iced tea.  Keep in mind that regular soda, juice, and other mixers may contain a lot of sugar and must be counted as carbs. What are tips for following this plan? Reading food labels  Start by checking the serving size on the "Nutrition Facts" label of packaged foods and drinks. The amount of calories, carbs, fats, and other nutrients listed on the label is based on one serving of the item. Many items contain more than one serving per package.  Check the total grams (g) of carbs in one serving. You can calculate the number of servings of carbs in one serving by dividing the total carbs by 15. For example, if a food has 30 g of total carbs per serving, it would be equal to 2 servings of carbs.  Check the number of grams (g) of saturated fats and trans fats in one serving. Choose foods that have   a low amount or none of these fats.  Check the number of milligrams (mg) of salt (sodium) in one serving. Most people should limit total sodium intake to less than 2,300 mg per day.  Always check the nutrition information of foods labeled as "low-fat" or "nonfat." These foods may be higher in added sugar or refined carbs and should be avoided.  Talk to your dietitian to identify your daily goals for nutrients listed on the label. Shopping  Avoid buying canned, pre-made, or processed foods. These foods tend to be high in fat, sodium, and added  sugar.  Shop around the outside edge of the grocery store. This is where you will most often find fresh fruits and vegetables, bulk grains, fresh meats, and fresh dairy. Cooking  Use low-heat cooking methods, such as baking, instead of high-heat cooking methods like deep frying.  Cook using healthy oils, such as olive, canola, or sunflower oil.  Avoid cooking with butter, cream, or high-fat meats. Meal planning  Eat meals and snacks regularly, preferably at the same times every day. Avoid going long periods of time without eating.  Eat foods that are high in fiber, such as fresh fruits, vegetables, beans, and whole grains. Talk with your dietitian about how many servings of carbs you can eat at each meal.  Eat 4-6 oz (112-168 g) of lean protein each day, such as lean meat, chicken, fish, eggs, or tofu. One ounce (oz) of lean protein is equal to: ? 1 oz (28 g) of meat, chicken, or fish. ? 1 egg. ?  cup (62 g) of tofu.  Eat some foods each day that contain healthy fats, such as avocado, nuts, seeds, and fish.   What foods should I eat? Fruits Berries. Apples. Oranges. Peaches. Apricots. Plums. Grapes. Mango. Papaya. Pomegranate. Kiwi. Cherries. Vegetables Lettuce. Spinach. Leafy greens, including kale, chard, collard greens, and mustard greens. Beets. Cauliflower. Cabbage. Broccoli. Carrots. Green beans. Tomatoes. Peppers. Onions. Cucumbers. Brussels sprouts. Grains Whole grains, such as whole-wheat or whole-grain bread, crackers, tortillas, cereal, and pasta. Unsweetened oatmeal. Quinoa. Brown or wild rice. Meats and other proteins Seafood. Poultry without skin. Lean cuts of poultry and beef. Tofu. Nuts. Seeds. Dairy Low-fat or fat-free dairy products such as milk, yogurt, and cheese. The items listed above may not be a complete list of foods and beverages you can eat. Contact a dietitian for more information. What foods should I avoid? Fruits Fruits canned with  syrup. Vegetables Canned vegetables. Frozen vegetables with butter or cream sauce. Grains Refined white flour and flour products such as bread, pasta, snack foods, and cereals. Avoid all processed foods. Meats and other proteins Fatty cuts of meat. Poultry with skin. Breaded or fried meats. Processed meat. Avoid saturated fats. Dairy Full-fat yogurt, cheese, or milk. Beverages Sweetened drinks, such as soda or iced tea. The items listed above may not be a complete list of foods and beverages you should avoid. Contact a dietitian for more information. Questions to ask a health care provider  Do I need to meet with a diabetes educator?  Do I need to meet with a dietitian?  What number can I call if I have questions?  When are the best times to check my blood glucose? Where to find more information:  American Diabetes Association: diabetes.org  Academy of Nutrition and Dietetics: www.eatright.org  National Institute of Diabetes and Digestive and Kidney Diseases: www.niddk.nih.gov  Association of Diabetes Care and Education Specialists: www.diabeteseducator.org Summary  It is important to have healthy eating   habits because your blood sugar (glucose) levels are greatly affected by what you eat and drink.  A healthy meal plan will help you control your blood glucose and maintain a healthy lifestyle.  Your health care provider may recommend that you work with a dietitian to make a meal plan that is best for you.  Keep in mind that carbohydrates (carbs) and alcohol have immediate effects on your blood glucose levels. It is important to count carbs and to use alcohol carefully. This information is not intended to replace advice given to you by your health care provider. Make sure you discuss any questions you have with your health care provider. Document Revised: 09/17/2019 Document Reviewed: 09/17/2019 Elsevier Patient Education  2021 Elsevier Inc.  

## 2021-07-28 NOTE — Progress Notes (Signed)
Rich Brave [place],acting as a Education administrator for [name], MD.,have documented all relevant documentation on the behalf of [name], MD,as directed by  [name], MD while in the presence of [name], MD.  This visit occurred during the SARS-CoV-2 public health emergency.  Safety protocols were in place, including screening questions prior to the visit, additional usage of staff PPE, and extensive cleaning of exam room while observing appropriate contact time as indicated for disinfecting solutions.  Subjective:     Patient ID: Tammy Preston , female    DOB: Nov 24, 1945 , 75 y.o.   MRN: [id number]   Chief Complaint  Patient presents with   Diabetes   Hypertension    HPI  She presents today for diabetes and bp f/u.  She reports compliance with meds. She denies headachesStates she got COVID booster last week. She did not have any issues with the booster.   She was seen earlier today by [facility] Advisor for [facility].   Diabetes She presents for her follow-up diabetic visit. She has type 2 diabetes mellitus. Her disease course has been stable. Pertinent negatives for hypoglycemia include no dizziness. Pertinent negatives for diabetes include no fatigue, no foot paresthesias, no polydipsia, no polyphagia and no polyuria. There are no hypoglycemic complications. There are no diabetic complications. Risk factors for coronary artery disease include diabetes mellitus, dyslipidemia, hypertension, post-menopausal and sedentary lifestyle. She is compliant with treatment all of the time. She is following a diabetic diet. She participates in exercise intermittently. Her breakfast blood glucose is taken between 8-9 am. Her breakfast blood glucose range is generally 110-130 mg/dl.    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 05/20/2017   Hyperlipemia    Hypertension    Hypertensive chronic kidney disease with stage 1 through stage 4 chronic kidney disease, or unspecified chronic kidney disease 05/20/2017   Numbness and tingling of both feet    Type 2 diabetes mellitus with diabetic chronic kidney disease ([place]) 05/20/2017     Family History  Problem Relation Age of Onset   Stroke Mother    Congestive Heart Failure Father      Current Outpatient Medications:    allopurinol (ZYLOPRIM) 100 MG tablet, Take 2 tablets (200 mg total) by mouth daily., Disp: 180 tablet, Rfl: 1   amLODipine (NORVASC) 2.5 MG tablet, Take 1 tablet (2.5 mg total) by mouth at bedtime., Disp: 90 tablet, Rfl: 2   ammonium lactate (AMLACTIN) 12 % cream, Apply topically as needed for dry skin. (Patient not taking: Reported on 07/28/2021), Disp: 385 g, Rfl: 4   aspirin EC 81 MG tablet, Take 81 mg by mouth daily after breakfast. , Disp: , Rfl:    BINAXNOW COVID-19 AG HOME TEST KIT, Use as Directed on the Package, Disp: , Rfl:    Calcium Carb-Cholecalciferol (CALCIUM + D3 PO), Take 1 tablet by mouth daily. , Disp: , Rfl:    Cholecalciferol (VITAMIN D) 2000 units tablet, Take 2,000 Units by mouth daily., Disp: , Rfl:    empagliflozin (JARDIANCE) 10 MG TABS tablet, Take 1 tablet (10 mg total) by mouth daily before breakfast., Disp: 90 tablet, Rfl: 1   FREESTYLE LITE test strip, Use as instructed to check  blood sugars 2 times per day dx: e11.22, Disp: 300 each, Rfl: 2   gabapentin (NEURONTIN) 100 MG capsule, TAKE 1 CAPSULE BY MOUTH AT BEDTIME, Disp: 90 capsule, Rfl: 0   Lancets (FREESTYLE) lancets, Use as instructed to check blood sugars 2 times per day dx: e11.22, Disp: 300 each, Rfl: 2   losartan (COZAAR) 100 MG tablet, TAKE 1 TABLET(100 MG) BY MOUTH DAILY, Disp: 90 tablet, Rfl: 2   metFORMIN (GLUCOPHAGE) 500 MG tablet, TAKE 1 TABLET BY MOUTH EVERY DAY AFTER  BREAKFAST, Disp: 90 tablet, Rfl: 1   Multiple Vitamins-Minerals (ONE-A-DAY WOMENS 50 PLUS PO), Take 1 tablet by mouth daily., Disp: , Rfl:    RESTASIS 0.05 % ophthalmic emulsion, 2 times per day, Disp: , Rfl:    rosuvastatin (CRESTOR) 20 MG tablet, Take 1 tablet (20 mg total) by mouth daily., Disp: 90 tablet, Rfl: 1   Sod Picosulfate-Mag Ox-Cit Acd (CLENPIQ) 10-3.5-12 MG-GM -GM/160ML SOLN, , Disp: , Rfl:    Allergies  Allergen Reactions   Percocet [Oxycodone-Acetaminophen] Nausea And Vomiting   Shellfish Allergy Other (See Comments)    Causes Gout     Review of Systems  Constitutional: Negative.  Negative for fatigue.  Respiratory: Negative.    Cardiovascular: Negative.   Gastrointestinal: Negative.   Endocrine: Negative for polydipsia, polyphagia and polyuria.  Neurological: Negative.  Negative for dizziness.  Psychiatric/Behavioral: Negative.      Today's Vitals   07/28/21 1035  BP: 109/60  Pulse: 100  Temp: 98 F (36.7 C)  Weight: 203 lb 11.3 oz (92.4 kg)  Height: 5' 5.6" (1.666 m)   Body mass index is 33.28 kg/m.  Wt Readings from Last 3 Encounters:  07/28/21 203 lb 11.3 oz (92.4 kg)  07/28/21 203 lb 9.6 oz (92.4 kg)  06/16/21 206 lb 12.8 oz (93.8 kg)     Objective:  Physical Exam Vitals and nursing note reviewed.  Constitutional:      Appearance: Normal appearance.  HENT:     Head: Normocephalic and atraumatic.     Nose:     Comments: Masked     Mouth/Throat:     Comments: Masked  Eyes:     Extraocular Movements: Extraocular movements intact.  Cardiovascular:     Rate and Rhythm: Normal rate and regular rhythm.     Heart sounds: Normal heart sounds.  Pulmonary:     Effort: Pulmonary effort is normal.     Breath sounds: Normal breath sounds.  Musculoskeletal:     Cervical back: Normal range of motion.  Skin:    General: Skin is warm.  Neurological:     General: No focal deficit present.     Mental Status: She is alert.  Psychiatric:        Mood  and Affect: Mood normal.        Behavior: Behavior normal.        Assessment And Plan:     1. Type 2 diabetes mellitus with stage 3a chronic kidney disease, without long-term current use of insulin (HCC) Comments: Chronic, I will check labs as listed below. Encouraged to follow dietary guidelines.  - BMP8+eGFR - Hemoglobin A1c  2. Hypertensive chronic kidney disease with stage 1 through stage 4 chronic kidney disease, or unspecified chronic kidney disease Comments: Chronic, well controlled. She is encouraged to follow low sodium diet.   3. 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 She reports compliance with allopurinol, she  is encouraged to limit her exposure to known triggers.  - Uric acid  4. Class 1 obesity due to excess calories with serious comorbidity and body mass index (BMI) of 33.0 to 33.9 in adult  She is encouraged to strive for BMI less than 30 to decrease cardiac risk. Advised to aim for at least 150 minutes of exercise per week.   Patient was given opportunity to ask questions. Patient verbalized understanding of the plan and was able to repeat key elements of the plan. All questions were answered to their satisfaction.   I, [name], MD, have reviewed all documentation for this visit. The documentation on 07/28/21 for the exam, diagnosis, procedures, and orders are all accurate and complete.   IF YOU HAVE BEEN REFERRED TO A SPECIALIST, IT MAY TAKE 1-2 WEEKS TO SCHEDULE/PROCESS THE REFERRAL. IF YOU HAVE NOT HEARD FROM US/SPECIALIST IN TWO WEEKS, PLEASE GIVE [place] A CALL AT [phone].   THE PATIENT IS ENCOURAGED TO PRACTICE SOCIAL DISTANCING DUE TO THE COVID-19 PANDEMIC.

## 2021-07-28 NOTE — Patient Instructions (Signed)
Tammy Preston , Thank you for taking time to come for your Medicare Wellness Visit. I appreciate your ongoing commitment to your health goals. Please review the following plan we discussed and let me know if I can assist you in the future.   Screening recommendations/referrals: Colonoscopy: not required Mammogram: 06/02/2021 Bone Density: completed 01/29/2018 Recommended yearly ophthalmology/optometry visit for glaucoma screening and checkup Recommended yearly dental visit for hygiene and checkup  Vaccinations: Influenza vaccine: completed 07/06/2021 Pneumococcal vaccine: completed 07/18/2019 Tdap vaccine: completed 01/22/2014, due 01/23/2024 Shingles vaccine: complete   Covid-19: 07/20/2021, 03/22/2021, 01/28/2020, 01/03/2021  Advanced directives: Advance directive discussed with you today. Even though you declined this today please call our office should you change your mind and we can give you the proper paperwork for you to fill out.  Conditions/risks identified: none  Next appointment: Follow up in one year for your annual wellness visit    Preventive Care 65 Years and Older, Female Preventive care refers to lifestyle choices and visits with your health care provider that can promote health and wellness. What does preventive care include? A yearly physical exam. This is also called an annual well check. Dental exams once or twice a year. Routine eye exams. Ask your health care provider how often you should have your eyes checked. Personal lifestyle choices, including: Daily care of your teeth and gums. Regular physical activity. Eating a healthy diet. Avoiding tobacco and drug use. Limiting alcohol use. Practicing safe sex. Taking low-dose aspirin every day. Taking vitamin and mineral supplements as recommended by your health care provider. What happens during an annual well check? The services and screenings done by your health care provider during your annual well check will depend on  your age, overall health, lifestyle risk factors, and family history of disease. Counseling  Your health care provider may ask you questions about your: Alcohol use. Tobacco use. Drug use. Emotional well-being. Home and relationship well-being. Sexual activity. Eating habits. History of falls. Memory and ability to understand (cognition). Work and work Statistician. Reproductive health. Screening  You may have the following tests or measurements: Height, weight, and BMI. Blood pressure. Lipid and cholesterol levels. These may be checked every 5 years, or more frequently if you are over 14 years old. Skin check. Lung cancer screening. You may have this screening every year starting at age 22 if you have a 30-pack-year history of smoking and currently smoke or have quit within the past 15 years. Fecal occult blood test (FOBT) of the stool. You may have this test every year starting at age 35. Flexible sigmoidoscopy or colonoscopy. You may have a sigmoidoscopy every 5 years or a colonoscopy every 10 years starting at age 104. Hepatitis C blood test. Hepatitis B blood test. Sexually transmitted disease (STD) testing. Diabetes screening. This is done by checking your blood sugar (glucose) after you have not eaten for a while (fasting). You may have this done every 1-3 years. Bone density scan. This is done to screen for osteoporosis. You may have this done starting at age 90. Mammogram. This may be done every 1-2 years. Talk to your health care provider about how often you should have regular mammograms. Talk with your health care provider about your test results, treatment options, and if necessary, the need for more tests. Vaccines  Your health care provider may recommend certain vaccines, such as: Influenza vaccine. This is recommended every year. Tetanus, diphtheria, and acellular pertussis (Tdap, Td) vaccine. You may need a Td booster every 10 years.  Zoster vaccine. You may need this  after age 77. Pneumococcal 13-valent conjugate (PCV13) vaccine. One dose is recommended after age 25. Pneumococcal polysaccharide (PPSV23) vaccine. One dose is recommended after age 11. Talk to your health care provider about which screenings and vaccines you need and how often you need them. This information is not intended to replace advice given to you by your health care provider. Make sure you discuss any questions you have with your health care provider. Document Released: 11/06/2015 Document Revised: 06/29/2016 Document Reviewed: 08/11/2015 Elsevier Interactive Patient Education  2017 [place] Prevention in the Home Falls can cause injuries. They can happen to people of all ages. There are many things you can do to make your home safe and to help prevent falls. What can I do on the outside of my home? Regularly fix the edges of walkways and driveways and fix any cracks. Remove anything that might make you trip as you walk through a door, such as a raised step or threshold. Trim any bushes or trees on the path to your home. Use bright outdoor lighting. Clear any walking paths of anything that might make someone trip, such as rocks or tools. Regularly check to see if handrails are loose or broken. Make sure that both sides of any steps have handrails. Any raised decks and porches should have guardrails on the edges. Have any leaves, snow, or ice cleared regularly. Use sand or salt on walking paths during winter. Clean up any spills in your garage right away. This includes oil or grease spills. What can I do in the bathroom? Use night lights. Install grab bars by the toilet and in the tub and shower. Do not use towel bars as grab bars. Use non-skid mats or decals in the tub or shower. If you need to sit down in the shower, use a plastic, non-slip stool. Keep the floor dry. Clean up any water that spills on the floor as soon as it happens. Remove soap buildup in the tub or  shower regularly. Attach bath mats securely with double-sided non-slip rug tape. Do not have throw rugs and other things on the floor that can make you trip. What can I do in the bedroom? Use night lights. Make sure that you have a light by your bed that is easy to reach. Do not use any sheets or blankets that are too big for your bed. They should not hang down onto the floor. Have a firm chair that has side arms. You can use this for support while you get dressed. Do not have throw rugs and other things on the floor that can make you trip. What can I do in the kitchen? Clean up any spills right away. Avoid walking on wet floors. Keep items that you use a lot in easy-to-reach places. If you need to reach something above you, use a strong step stool that has a grab bar. Keep electrical cords out of the way. Do not use floor polish or wax that makes floors slippery. If you must use wax, use non-skid floor wax. Do not have throw rugs and other things on the floor that can make you trip. What can I do with my stairs? Do not leave any items on the stairs. Make sure that there are handrails on both sides of the stairs and use them. Fix handrails that are broken or loose. Make sure that handrails are as long as the stairways. Check any carpeting to make sure that  it is firmly attached to the stairs. Fix any carpet that is loose or worn. Avoid having throw rugs at the top or bottom of the stairs. If you do have throw rugs, attach them to the floor with carpet tape. Make sure that you have a light switch at the top of the stairs and the bottom of the stairs. If you do not have them, ask someone to add them for you. What else can I do to help prevent falls? Wear shoes that: Do not have high heels. Have rubber bottoms. Are comfortable and fit you well. Are closed at the toe. Do not wear sandals. If you use a stepladder: Make sure that it is fully opened. Do not climb a closed stepladder. Make  sure that both sides of the stepladder are locked into place. Ask someone to hold it for you, if possible. Clearly mark and make sure that you can see: Any grab bars or handrails. First and last steps. Where the edge of each step is. Use tools that help you move around (mobility aids) if they are needed. These include: Canes. Walkers. Scooters. Crutches. Turn on the lights when you go into a dark area. Replace any light bulbs as soon as they burn out. Set up your furniture so you have a clear path. Avoid moving your furniture around. If any of your floors are uneven, fix them. If there are any pets around you, be aware of where they are. Review your medicines with your doctor. Some medicines can make you feel dizzy. This can increase your chance of falling. Ask your doctor what other things that you can do to help prevent falls. This information is not intended to replace advice given to you by your health care provider. Make sure you discuss any questions you have with your health care provider. Document Released: 08/06/2009 Document Revised: 03/17/2016 Document Reviewed: 11/14/2014 Elsevier Interactive Patient Education  2017 [employer].

## 2021-07-28 NOTE — Addendum Note (Signed)
Addended by: Glenna Durand E on: 07/28/2021 12:23 PM   Modules accepted: Orders

## 2021-07-28 NOTE — Progress Notes (Signed)
This visit occurred during the SARS-CoV-2 public health emergency.  Safety protocols were in place, including screening questions prior to the visit, additional usage of staff PPE, and extensive cleaning of exam room while observing appropriate contact time as indicated for disinfecting solutions.  Subjective:   Tammy Preston is a 75 y.o. female who presents for Medicare Annual (Subsequent) preventive examination.  Review of Systems     Cardiac Risk Factors include: advanced age (>52mn, >>65women);diabetes mellitus;hypertension;obesity (BMI >30kg/m2);sedentary lifestyle;smoking/ tobacco exposure     Objective:    Today's Vitals   07/28/21 1015  BP: 109/60  Pulse: 100  Temp: 98 F (36.7 C)  TempSrc: Oral  SpO2: 98%  Weight: 203 lb 9.6 oz (92.4 kg)  Height: 5' 5.6" (1.666 m)   Body mass index is 33.26 kg/m.  Advanced Directives 07/28/2021 07/22/2020 07/18/2019 05/07/2017 03/27/2017  Does Patient Have a Medical Advance Directive? No No No No No  Would patient like information on creating a medical advance directive? - No - Patient declined No - Patient declined - Yes (MAU/Ambulatory/Procedural Areas - Information given)    Current Medications (verified) Outpatient Encounter Medications as of 07/28/2021  Medication Sig   allopurinol (ZYLOPRIM) 100 MG tablet Take 2 tablets (200 mg total) by mouth daily.   amLODipine (NORVASC) 2.5 MG tablet Take 1 tablet (2.5 mg total) by mouth at bedtime.   aspirin EC 81 MG tablet Take 81 mg by mouth daily after breakfast.    Calcium Carb-Cholecalciferol (CALCIUM + D3 PO) Take 1 tablet by mouth daily.    Cholecalciferol (VITAMIN D) 2000 units tablet Take 2,000 Units by mouth daily.   empagliflozin (JARDIANCE) 10 MG TABS tablet Take 1 tablet (10 mg total) by mouth daily before breakfast.   FREESTYLE LITE test strip Use as instructed to check blood sugars 2 times per day dx: e11.22   gabapentin (NEURONTIN) 100 MG capsule TAKE 1 CAPSULE BY MOUTH AT  BEDTIME   Lancets (FREESTYLE) lancets Use as instructed to check blood sugars 2 times per day dx: e11.22   losartan (COZAAR) 100 MG tablet TAKE 1 TABLET(100 MG) BY MOUTH DAILY   metFORMIN (GLUCOPHAGE) 500 MG tablet TAKE 1 TABLET BY MOUTH EVERY DAY AFTER BREAKFAST   Multiple Vitamins-Minerals (ONE-A-DAY WOMENS 50 PLUS PO) Take 1 tablet by mouth daily.   RESTASIS 0.05 % ophthalmic emulsion 2 times per day   rosuvastatin (CRESTOR) 20 MG tablet Take 1 tablet (20 mg total) by mouth daily.   ammonium lactate (AMLACTIN) 12 % cream Apply topically as needed for dry skin. (Patient not taking: Reported on 07/28/2021)   [place] KIT Use as Directed on the [place]entre[place]Ox-Cit A[place](CLENPIQ) 10-3.5-12 MG-GM -GM/160ML SOLN  (Patient not taking: Reported on 07/28/2021)   No facility-administered encounter medications on file as of 07/28/2021.    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 05/20/2017   Hyperlipemia    Hypertension    Hypertensive chronic kidney disease with stage 1 through stage 4 chronic kidney disease, or unspecified chronic kidney disease 05/20/2017   Numbness and tingling of both feet    Type 2 diabetes mellitus with  diabetic chronic kidney disease ([place]) 05/20/2017   Past Surgical History:  Procedure Laterality Date   BACK SURGERY     BREAST LUMPECTOMY Left    COLONOSCOPY     DILATATION & CURETTAGE/HYSTEROSCOPY WITH MYOSURE N/A 04/07/2017   Procedure: DILATATION & CURETTAGE/HYSTEROSCOPY WITH MYOSURE;  Surgeon: Servando Salina, MD;  Location: [facility];  Service: Gynecology;  Laterality: N/A;   KNEE SURGERY      Family History  Problem Relation Age of Onset   Stroke Mother    Congestive Heart Failure Father    Social History   Socioeconomic History   Marital status: Married    Spouse name: Not on file   Number of children: Not on file   Years of education: Not on file   Highest education level: Not on file  Occupational History   Occupation: retired  Tobacco Use   Smoking status: Every Day    Packs/day: 0.25    Years: 54.00    Pack years: 13.50    Types: Cigarettes    Start date: 10/24/1965   Smokeless tobacco: Never  Vaping Use   Vaping Use: Never used  Substance and Sexual Activity   Alcohol use: No    Alcohol/week: 0.0 standard drinks   Drug use: No   Sexual activity: Not Currently  Other Topics Concern   Not on file  Social History Narrative   Not on file   Social Determinants of Health   Financial Resource Strain: Low Risk    Difficulty of Paying Living Expenses: Not hard at all  Food Insecurity: No Food Insecurity   Worried About Charity fundraiser in the Last Year: Never true   [place] in the Last Year: Never true  Transportation Needs: No Transportation Needs   Lack of Transportation (Medical): No   Lack of Transportation (Non-Medical): No  Physical Activity: Inactive   Days of Exercise per Week: 0 days   Minutes of Exercise per Session: 0 min  Stress: No Stress Concern Present   Feeling of Stress : Not at all  Social Connections: Not on file    Tobacco Counseling Ready to quit: Not Answered Counseling given: Not Answered   Clinical Intake:  Pre-visit preparation completed: Yes  Pain : No/denies pain     Nutritional Status: BMI > 30  Obese Nutritional Risks: Nausea/ vomitting/ diarrhea (diarrhea just now) Diabetes: Yes  How often do you need to have someone help you when you read instructions, pamphlets, or other written materials from your doctor or pharmacy?: 1 - Never What is the last grade level you completed in school?: 12th  grade  Diabetic? Yes Nutrition Risk Assessment:  Has the patient had any N/V/D within the last 2 months?  Yes  Does the patient have any non-healing wounds?  No  Has the patient had any unintentional weight loss or weight gain?  No   Diabetes:  Is the patient diabetic?  Yes  If diabetic, was a CBG obtained today?  No  Did the patient bring in their glucometer from home?  No  How often do you monitor your CBG's? 2-3 weekly.   Financial Strains and Diabetes Management:  Are you having any financial strains with the device, your supplies or your medication? No .  Does the patient want to be seen by Chronic Care Management for management of their diabetes?  No  Would the patient like to be referred to a Nutritionist or for Diabetic Management?  No   Diabetic  Exams:  Diabetic Eye Exam: Completed 09/03/2020 Diabetic Foot Exam: Overdue, Pt has been advised about the importance in completing this exam. Pt is scheduled for diabetic foot exam on next appointment.   Interpreter Needed?: No  Information entered by :: NAllen LPN   Activities of Daily Living In your present state of health, do you have any difficulty performing the following activities: 07/28/2021  Hearing? N  Vision? N  Difficulty concentrating or making decisions? N  Walking or climbing stairs? N  Dressing or bathing? N  Doing errands, shopping? N  Preparing Food and eating ? N  Using the Toilet? N  In the past six months, have you accidently leaked urine? N  Do you have problems with loss of bowel control? N  Managing your Medications? N  Managing your Finances? N  Housekeeping or managing your Housekeeping? N  Some recent data might be hidden    Patient Care Team: Glendale Chard, MD as PCP - General (Internal Medicine)  Indicate any recent Medical Services you may have received from other than [facility] providers in the past year (date may be approximate).     Assessment:   This is a routine wellness  examination for [place].  Hearing/Vision screen Vision Screening - Comments:: Regular eye exams, Dr. Venetia Maxon  Dietary issues and exercise activities discussed: Current Exercise Habits: The patient does not participate in regular exercise at present   Goals Addressed             This Visit's Progress    Patient Stated       07/28/2021, no goals       Depression Screen PHQ 2/9 Scores 07/28/2021 07/22/2020 04/20/2020 09/04/2019 07/18/2019 04/23/2019 12/20/2018  PHQ - 2 Score 0 0 0 0 0 0 0  PHQ- 9 Score - - - - 0 - -    Fall Risk Fall Risk  07/28/2021 07/22/2020 04/20/2020 09/04/2019 07/18/2019  Falls in the past year? 0 0 0 0 0  Comment - - - - -  Number falls in past yr: - - 0 - 0  Injury with Fall? - - 0 - -  Risk for fall due to : Medication side effect Medication side effect - - Medication side effect  Follow up Falls evaluation completed;Education provided;Falls prevention discussed Falls evaluation completed;Education provided;Falls prevention discussed - - Falls evaluation completed;Education provided;Falls prevention discussed    FALL RISK PREVENTION PERTAINING TO THE HOME:  Any stairs in or around the home? Yes  If so, are there any without handrails? No  Home free of loose throw rugs in walkways, pet beds, electrical cords, etc? Yes  Adequate lighting in your home to reduce risk of falls? Yes   ASSISTIVE DEVICES UTILIZED TO PREVENT FALLS:  Life alert? No  Use of a cane, walker or w/c? No  Grab bars in the bathroom? Yes  Shower chair or bench in shower? Yes  Elevated toilet seat or a handicapped toilet? Yes   TIMED UP AND GO:  Was the test performed? No .    Gait steady and fast without use of assistive device  Cognitive Function:     6CIT Screen 07/28/2021 07/22/2020 07/18/2019  What Year? 0 points 0 points 0 points  What month? 0 points 0 points 0 points  What time? 0 points 0 points 0 points  Count back from 20 0 points 0 points 0 points  Months in reverse  0 points 0 points 0 points  Repeat phrase 2 points 0 points 0  points  Total Score 2 0 0    Immunizations Immunization History  Administered Date(s) Administered   DTaP 01/22/2014   Fluad Quad(high Dose 65+) 07/22/2020   Influenza, High Dose Seasonal PF 07/06/2020, 07/06/2021   Influenza-Unspecified 07/24/2013, 07/11/2018, 07/18/2019   PFIZER Comirnaty(Gray Top)Covid-19 Tri-Sucrose Vaccine 03/22/2021   PFIZER(Purple Top)SARS-COV-2 Vaccination 01/04/2020, 01/28/2020, 08/12/2020, 03/22/2021, 07/20/2021   Pneumococcal Conjugate-13 07/13/2018   Pneumococcal Polysaccharide-23 07/18/2019   Pneumococcal-Unspecified 07/10/2017   Zoster Recombinat (Shingrix) 12/22/2017, 01/27/2018, 04/05/2018    TDAP status: Up to date  Flu Vaccine status: Up to date  Pneumococcal vaccine status: Up to date  Covid-19 vaccine status: Completed vaccines  Qualifies for Shingles Vaccine? Yes   Zostavax completed No   Shingrix Completed?: Yes  Screening Tests Health Maintenance  Topic Date Due   FOOT EXAM  07/22/2021   OPHTHALMOLOGY EXAM  09/03/2021   HEMOGLOBIN A1C  10/07/2021   MAMMOGRAM  06/02/2022   TETANUS/TDAP  01/23/2024   COLONOSCOPY (Pts 45-14yr Insurance coverage will need to be confirmed)  07/15/2031   INFLUENZA VACCINE  Completed   DEXA SCAN  Completed   COVID-19 Vaccine  Completed   Hepatitis C Screening  Completed   Zoster Vaccines- Shingrix  Completed   HPV VACCINES  Aged Out    Health Maintenance  Health Maintenance Due  Topic Date Due   FOOT EXAM  07/22/2021    Colorectal cancer screening: No longer required.   Mammogram status: Completed 06/02/2021. Repeat every year  Bone Density status: Completed 01/29/2018.   Lung Cancer Screening: (Low Dose CT Chest recommended if Age 74-80years, 30 pack-year currently smoking OR have quit w/in 15years.) does not qualify.   Lung Cancer Screening Referral: no  Additional Screening:  Hepatitis C Screening: does qualify;  Completed 03/14/2018  Vision Screening: Recommended annual ophthalmology exams for early detection of glaucoma and other disorders of the eye. Is the patient up to date with their annual eye exam?  Yes  Who is the provider or what is the name of the office in which the patient attends annual eye exams? Dr. WVenetia MaxonIf pt is not established with a provider, would they like to be referred to a provider to establish care? No .   Dental Screening: Recommended annual dental exams for proper oral hygiene  Community Resource Referral / Chronic Care Management: CRR required this visit?  No   CCM required this visit?  No      Plan:     I have personally reviewed and noted the following in the patient's chart:   Medical and social history Use of alcohol, tobacco or illicit drugs  Current medications and supplements including opioid prescriptions.  Functional ability and status Nutritional status Physical activity Advanced directives List of other physicians Hospitalizations, surgeries, and ER visits in previous 12 months Vitals Screenings to include cognitive, depression, and falls Referrals and appointments  In addition, I have reviewed and discussed with patient certain preventive protocols, quality metrics, and best practice recommendations. A written personalized care plan for preventive services as well as general preventive health recommendations were provided to patient.     NKellie Simmering LPN   [id number]  Nurse Notes:

## 2021-07-29 ENCOUNTER — Other Ambulatory Visit: Payer: Self-pay | Admitting: Internal Medicine

## 2021-07-29 LAB — HEMOGLOBIN A1C
Est. average glucose Bld gHb Est-mCnc: 194 mg/dL
Hgb A1c MFr Bld: 8.4 % — ABNORMAL HIGH (ref 4.8–5.6)

## 2021-07-29 LAB — BMP8+EGFR
BUN/Creatinine Ratio: 15 (ref 12–28)
BUN: 19 mg/dL (ref 8–27)
CO2: 22 mmol/L (ref 20–29)
Calcium: 10.1 mg/dL (ref 8.7–10.3)
Chloride: 101 mmol/L (ref 96–106)
Creatinine, Ser: 1.31 mg/dL — ABNORMAL HIGH (ref 0.57–1.00)
Glucose: 135 mg/dL — ABNORMAL HIGH (ref 70–99)
Potassium: 4.3 mmol/L (ref 3.5–5.2)
Sodium: 140 mmol/L (ref 134–144)
eGFR: 43 mL/min/{1.73_m2} — ABNORMAL LOW (ref >59)

## 2021-07-29 LAB — URIC ACID: Uric Acid: 3.8 mg/dL (ref 3.1–7.9)

## 2021-07-29 MED ORDER — ALLOPURINOL 100 MG PO TABS: 200.0000 mg | ORAL_TABLET | Freq: Every day | ORAL | 1 refills | Status: DC

## 2021-08-02 ENCOUNTER — Other Ambulatory Visit: Payer: Self-pay | Admitting: Internal Medicine

## 2021-08-02 ENCOUNTER — Other Ambulatory Visit: Payer: Self-pay

## 2021-08-02 MED ORDER — LOSARTAN POTASSIUM 100 MG PO TABS: ORAL_TABLET | ORAL | 2 refills | Status: DC

## 2021-08-02 MED ORDER — ALLOPURINOL 100 MG PO TABS: 200.0000 mg | ORAL_TABLET | Freq: Every day | ORAL | 1 refills | Status: DC

## 2021-08-02 MED ORDER — METFORMIN HCL 500 MG PO TABS: ORAL_TABLET | ORAL | 1 refills | Status: DC

## 2021-08-16 ENCOUNTER — Other Ambulatory Visit: Payer: Self-pay | Admitting: *Deleted

## 2021-08-16 MED ORDER — ROSUVASTATIN CALCIUM 20 MG PO TABS: 20.0000 mg | ORAL_TABLET | Freq: Every day | ORAL | 3 refills | Status: DC

## 2021-08-23 ENCOUNTER — Other Ambulatory Visit: Payer: Self-pay

## 2021-08-23 ENCOUNTER — Ambulatory Visit (INDEPENDENT_AMBULATORY_CARE_PROVIDER_SITE_OTHER): Payer: Medicare Other | Admitting: Podiatry

## 2021-08-23 ENCOUNTER — Encounter: Payer: Self-pay | Admitting: Podiatry

## 2021-08-23 DIAGNOSIS — E1149 Type 2 diabetes mellitus with other diabetic neurological complication: Secondary | ICD-10-CM

## 2021-08-23 DIAGNOSIS — M79675 Pain in left toe(s): Secondary | ICD-10-CM | POA: Diagnosis not present

## 2021-08-23 DIAGNOSIS — M79674 Pain in right toe(s): Secondary | ICD-10-CM | POA: Diagnosis not present

## 2021-08-23 DIAGNOSIS — B351 Tinea unguium: Secondary | ICD-10-CM | POA: Diagnosis not present

## 2021-08-23 DIAGNOSIS — Q828 Other specified congenital malformations of skin: Secondary | ICD-10-CM

## 2021-08-23 NOTE — Progress Notes (Signed)
Subjective: 75 y.o. returns the office today for painful, elongated, thickened toenails which she cannot trim herself. Denies any redness or drainage around the nails.  States she is doing well and not having any significant foot pain.  No open sores.  Calluses are doing better but still present on the right. She has no other concerns today.  PCP: Glendale Chard, MD Last see: 06/08/2021 Last A1c was 8.64 07/28/2021  Objective: AAO 3, NAD DP/PT pulses palpable, CRT less than 3 seconds Nails hypertrophic, dystrophic, elongated, brittle, discolored 10. There is tenderness overlying the nails 1-5 bilaterally. There is no surrounding erythema or drainage along the nail sites. Mild hyperkeratotic tissue right submetatarsal 5.  No underlying ulceration drainage or signs of infection.  No other open lesions or preulcerative lesions. No open lesions or pre-ulcerative lesions are identified. No pain with calf compression, swelling, warmth, erythema.  Assessment: Patient presents with symptomatic onychomycosis, hyperkeratotic lesions, neuropathy  Plan: -Treatment options including alternatives, risks, complications were discussed -Nails sharply debrided 10 without complication/bleeding. -Hyperkeratotic lesion sharply debrided x1 without any complications or bleeding. -Continue gabapentin.  -Discussed daily foot inspection. If there are any changes, to call the office immediately.  -Follow-up in 3 months or sooner if any problems are to arise. In the meantime, encouraged to call the office with any questions, concerns, changes symptoms.  Celesta Gentile, DPM

## 2021-08-31 ENCOUNTER — Ambulatory Visit (INDEPENDENT_AMBULATORY_CARE_PROVIDER_SITE_OTHER): Payer: Medicare Other | Admitting: Internal Medicine

## 2021-08-31 ENCOUNTER — Encounter: Payer: Self-pay | Admitting: Internal Medicine

## 2021-08-31 ENCOUNTER — Other Ambulatory Visit: Payer: Self-pay

## 2021-08-31 VITALS — BP 128/70 | HR 79 | Resp 20 | Ht 66.0 in | Wt 203.8 lb

## 2021-08-31 DIAGNOSIS — I10 Essential (primary) hypertension: Secondary | ICD-10-CM | POA: Diagnosis not present

## 2021-08-31 DIAGNOSIS — I2584 Coronary atherosclerosis due to calcified coronary lesion: Secondary | ICD-10-CM

## 2021-08-31 DIAGNOSIS — R5381 Other malaise: Secondary | ICD-10-CM | POA: Diagnosis not present

## 2021-08-31 DIAGNOSIS — Z20822 Contact with and (suspected) exposure to covid-19: Secondary | ICD-10-CM | POA: Diagnosis not present

## 2021-08-31 DIAGNOSIS — R5383 Other fatigue: Secondary | ICD-10-CM | POA: Diagnosis not present

## 2021-08-31 DIAGNOSIS — I2510 Atherosclerotic heart disease of native coronary artery without angina pectoris: Secondary | ICD-10-CM | POA: Diagnosis not present

## 2021-08-31 NOTE — Progress Notes (Signed)
Cardiology Office Note:    Date:  08/31/2021   ID:  Tammy Preston, DOB 07/18/1946, MRN [id number]  PCP:  Glendale Chard, MD  Cardiologist:  None  Electrophysiologist:  None   Referring MD: Glendale Chard, MD   Chief Complaint/Reason for Referral: Follow-up coronary artery calcifications  History of Present Illness:    Tammy Preston is a 76 y.o. female with a history of breast cancer, CKD stage 3, diabetes mellitus type 2, hyperlipemia, and hypertension, here for follow-up. I last saw her 01/28/2021.  Doing well overall. No longer has symptoms of dizziness or vertigo. She feels she has her stamina back and thinks it was "all in her head". She was able to do a social activity for 5.5 hours recently and feels back to baseline.   The patient denies chest pain, chest pressure, dyspnea at rest or with exertion, palpitations, PND, orthopnea, or leg swelling. Denies cough, fever, chills. Denies nausea, vomiting. Denies syncope or presyncope. Denies dizziness or lightheadedness.   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) 05/20/2017    Past Surgical History:  Procedure Laterality Date   BACK SURGERY     BREAST LUMPECTOMY Left    COLONOSCOPY     DILATATION & CURETTAGE/HYSTEROSCOPY WITH MYOSURE N/A 04/07/2017   Procedure: DILATATION & CURETTAGE/HYSTEROSCOPY WITH MYOSURE;  Surgeon: Servando Salina, MD;  Location: [facility];  Service: Gynecology;   Laterality: N/A;   KNEE SURGERY      Current Medications: Current Meds  Medication Sig   allopurinol (ZYLOPRIM) 100 MG tablet Take 2 tablets (200 mg total) by mouth daily.   amLODipine (NORVASC) 2.5 MG tablet Take 1 tablet (2.5 mg total) by mouth at bedtime.   aspirin EC 81 MG tablet Take 81 mg by mouth daily after breakfast.    BINAXNOW COVID-19 AG HOME TEST KIT Use as Directed on the Package   Calcium Carb-Cholecalciferol (CALCIUM + D3 PO) Take 1 tablet by mouth daily.    Cholecalciferol (VITAMIN D) 2000 units tablet Take 2,000 Units by mouth daily.   empagliflozin (JARDIANCE) 10 MG TABS tablet Take 1 tablet (10 mg total) by mouth daily before breakfast.   FREESTYLE LITE test strip Use as instructed to check blood sugars 2 times per day dx: e11.22   gabapentin (NEURONTIN) 100 MG capsule TAKE 1 CAPSULE BY MOUTH AT BEDTIME   Lancets (FREESTYLE) lancets Use as instructed to check blood sugars 2 times per day dx: e11.22   losartan (COZAAR) 100 MG tablet One tab po qd   metFORMIN (GLUCOPHAGE) 500 MG tablet TAKE 1 TABLET BY MOUTH EVERY DAY AFTER BREAKFAST   Multiple Vitamins-Minerals (ONE-A-DAY WOMENS 50 PLUS PO) Take 1 tablet by mouth daily.   RESTASIS 0.05 % ophthalmic emulsion 2 times per day   rosuvastatin (CRESTOR) 20 MG tablet Take 1 tablet (20 mg total) by mouth daily.     Allergies:   Percocet [oxycodone-acetaminophen] and Shellfish allergy   Social  History   Tobacco Use   Smoking status: Every Day    Packs/day: 0.25    Years: 54.00    Pack years: 13.50    Types: Cigarettes    Start date: 10/24/1965   Smokeless tobacco: Never  Vaping Use   Vaping Use: Never used  Substance Use Topics   Alcohol use: No    Alcohol/week: 0.0 standard drinks   Drug use: No     Family History: The patient's family history includes Congestive Heart Failure in her father; Stroke in her mother.  ROS:   Please see the history of present illness.    (+) Bilateral LE weakness (+) Fatigue (+)  Gait instability/Imbalance (+) Neck pain All other systems reviewed and are negative.  EKGs/Labs/Other Studies Reviewed:    The following studies were reviewed today: No prior CV studies available.  EKG:  08/31/21: NSR 06/16/2021: EKG is not ordered today. 01/28/2021: NSR, LAE  I have independently reviewed the images from CT chest 01/08/21 - coronary artery calcifications.  Recent Labs: 04/07/2021: ALT 34 06/01/2021: Hemoglobin 14.5; Platelets 13; TSH 2.26 07/28/2021: BUN 19; Creatinine, Ser 1.31; Potassium 4.3; Sodium 140  Recent Lipid Panel    Component Value Date/Time   CHOL 93 (L) 04/07/2021 1037   TRIG 112 04/07/2021 1037   HDL 27 (L) 04/07/2021 1037   CHOLHDL 3.4 04/07/2021 1037   LDLCALC 45 04/07/2021 1037    Physical Exam:    VS:  BP 128/70 (BP Location: Right Arm, Patient Position: Sitting, Cuff Size: Normal)   Pulse 79   Resp 20   Ht _0  (1.676 m)   Wt 203 lb 12.8 oz (92.4 kg)   SpO2 98%   BMI 32.89 kg/m     Wt Readings from Last 5 Encounters:  08/31/21 203 lb 12.8 oz (92.4 kg)  07/28/21 203 lb 11.3 oz (92.4 kg)  07/28/21 203 lb 9.6 oz (92.4 kg)  06/16/21 206 lb 12.8 oz (93.8 kg)  05/18/21 207 lb 12.8 oz (94.3 kg)    Constitutional: No acute distress Eyes: sclera non-icteric, normal conjunctiva and lids ENMT: normal dentition, moist mucous membranes Cardiovascular: regular rhythm, normal rate, no murmurs. S1 and S2 normal. Radial pulses normal bilaterally. No jugular venous distention.  Respiratory: clear to auscultation bilaterally GI : normal bowel sounds, soft and nontender. No distention.   MSK: extremities warm, well perfused. No edema.  NEURO: grossly nonfocal exam, moves all extremities. PSYCH: alert and oriented x 3, normal mood and affect.   ASSESSMENT:    1. Coronary artery calcification   2. Primary hypertension   3. Fatigue, unspecified type   4. Physical deconditioning     PLAN:    Fatigue, unspecified type Physical  deconditioning - She is doing well and has no concerns today. Can defer echo unless recurrent symptoms.  Coronary artery calcification - from CT chest 01/08/21 - tolerating Crestor 20 mg daily. Lipids optimized as of 04/07/21.  - continue ASA 81 mg daily. - also on Jardience 10 mg daily for DM2.   HTN - continue amlodipine 2.5 mg, losartan 100 mg daily. BP well controlled.   Total time of encounter: 20 minutes total time of encounter, including 15 minutes spent in face-to-face patient care on the date of this encounter. This time includes coordination of care and counseling regarding above mentioned problem list. Remainder of non-face-to-face time involved reviewing chart documents/testing relevant to the patient encounter and documentation in the medical record. I have independently reviewed documentation from referring provider.  Cherlynn Kaiser, MD, [place]   Shared Decision Making/Informed Consent:       Medication Adjustments/Labs and Tests Ordered: Current medicines are reviewed at length with the patient today.  Concerns regarding medicines are outlined above.   Orders Placed This Encounter  Procedures   EKG 12-Lead     No orders of the defined types were placed in this encounter.   Patient Instructions  Medication Instructions:  Your physician recommends that you continue on your current medications as directed. Please refer to the Current Medication list given to you today.  *If you need a refill on your cardiac medications before your next appointment, please call your pharmacy*  Follow-Up: At [facility], you and your health needs are our priority.  As part of our continuing mission to provide you with exceptional heart care, we have created designated Provider Care Teams.  These Care Teams include your primary Cardiologist (physician) and Advanced Practice Providers (APPs -  Physician Assistants and Nurse Practitioners) who all work  together to provide you with the care you need, when you need it.  We recommend signing up for the patient portal called "MyChart".  Sign up information is provided on this After Visit Summary.  MyChart is used to connect with patients for Virtual Visits (Telemedicine).  Patients are able to view lab/test results, encounter notes, upcoming appointments, etc.  Non-urgent messages can be sent to your provider as well.   To learn more about what you can do with MyChart, go to [email or web address].    Your next appointment:   12 month(s)  The format for your next appointment:   In Person  Provider:   Dr. Margaretann Loveless

## 2021-08-31 NOTE — Patient Instructions (Signed)
Medication Instructions:  Your physician recommends that you continue on your current medications as directed. Please refer to the Current Medication list given to you today.  *If you need a refill on your cardiac medications before your next appointment, please call your pharmacy*  Follow-Up: At [facility], you and your health needs are our priority.  As part of our continuing mission to provide you with exceptional heart care, we have created designated Provider Care Teams.  These Care Teams include your primary Cardiologist (physician) and Advanced Practice Providers (APPs -  Physician Assistants and Nurse Practitioners) who all work together to provide you with the care you need, when you need it.  We recommend signing up for the patient portal called "MyChart".  Sign up information is provided on this After Visit Summary.  MyChart is used to connect with patients for Virtual Visits (Telemedicine).  Patients are able to view lab/test results, encounter notes, upcoming appointments, etc.  Non-urgent messages can be sent to your provider as well.   To learn more about what you can do with MyChart, go to [email or web address].    Your next appointment:   12 month(s)  The format for your next appointment:   In Person  Provider:   Dr. Margaretann Loveless

## 2021-09-09 LAB — HM DIABETES EYE EXAM

## 2021-10-20 ENCOUNTER — Other Ambulatory Visit: Payer: Self-pay

## 2021-10-20 MED ORDER — EMPAGLIFLOZIN 10 MG PO TABS: 10.0000 mg | ORAL_TABLET | Freq: Every day | ORAL | 1 refills | Status: DC

## 2021-11-11 ENCOUNTER — Other Ambulatory Visit: Payer: Self-pay

## 2021-11-11 ENCOUNTER — Ambulatory Visit (INDEPENDENT_AMBULATORY_CARE_PROVIDER_SITE_OTHER): Payer: Medicare Other | Admitting: Podiatry

## 2021-11-11 DIAGNOSIS — M79674 Pain in right toe(s): Secondary | ICD-10-CM | POA: Diagnosis not present

## 2021-11-11 DIAGNOSIS — E1149 Type 2 diabetes mellitus with other diabetic neurological complication: Secondary | ICD-10-CM | POA: Diagnosis not present

## 2021-11-11 DIAGNOSIS — B351 Tinea unguium: Secondary | ICD-10-CM

## 2021-11-11 DIAGNOSIS — Q828 Other specified congenital malformations of skin: Secondary | ICD-10-CM | POA: Diagnosis not present

## 2021-11-11 DIAGNOSIS — M79675 Pain in left toe(s): Secondary | ICD-10-CM | POA: Diagnosis not present

## 2021-11-14 NOTE — Progress Notes (Signed)
Subjective: 76 y.o. returns the office today for painful, elongated, thickened toenails which she cannot trim herself. Denies any redness or drainage around the nails.  She also gets callus on the ball of her right foot which cause discomfort.  No open sores that she reports.  No fevers or chills.  No other concerns.  PCP: Glendale Chard, MD Last see: 07/28/2021 Last A1c was 8.4 07/28/2021  Objective: AAO 3, NAD DP/PT pulses palpable, CRT less than 3 seconds Nails hypertrophic, dystrophic, elongated, brittle, discolored 10. There is tenderness overlying the nails 1-5 bilaterally. There is no surrounding erythema or drainage along the nail sites. Hperkeratotic tissue right submetatarsal 5.  No underlying ulceration drainage or signs of infection.  No other open lesions or preulcerative lesions. No open lesions or pre-ulcerative lesions are identified. No pain with calf compression, swelling, warmth, erythema.  Assessment: Patient presents with symptomatic onychomycosis, hyperkeratotic lesions, neuropathy  Plan: -Treatment options including alternatives, risks, complications were discussed -Nails sharply debrided 10 without complication/bleeding. -Hyperkeratotic lesion sharply debrided x1 without any complications or bleeding. -Continue gabapentin.  -Discussed daily foot inspection. If there are any changes, to call the office immediately.  -Follow-up in 3 months or sooner if any problems are to arise. In the meantime, encouraged to call the office with any questions, concerns, changes symptoms.  Celesta Gentile, DPM

## 2021-11-25 DIAGNOSIS — R3121 Asymptomatic microscopic hematuria: Secondary | ICD-10-CM | POA: Diagnosis not present

## 2021-11-25 DIAGNOSIS — N183 Chronic kidney disease, stage 3 unspecified: Secondary | ICD-10-CM | POA: Diagnosis not present

## 2021-11-30 ENCOUNTER — Other Ambulatory Visit: Payer: Self-pay

## 2021-11-30 ENCOUNTER — Ambulatory Visit (INDEPENDENT_AMBULATORY_CARE_PROVIDER_SITE_OTHER): Payer: Medicare Other | Admitting: Internal Medicine

## 2021-11-30 ENCOUNTER — Encounter: Payer: Self-pay | Admitting: Internal Medicine

## 2021-11-30 VITALS — BP 122/62 | HR 82 | Temp 98.7°F | Ht 66.0 in | Wt 201.6 lb

## 2021-11-30 DIAGNOSIS — E6609 Other obesity due to excess calories: Secondary | ICD-10-CM | POA: Diagnosis not present

## 2021-11-30 DIAGNOSIS — Z6832 Body mass index (BMI) 32.0-32.9, adult: Secondary | ICD-10-CM | POA: Diagnosis not present

## 2021-11-30 DIAGNOSIS — N1831 Chronic kidney disease, stage 3a: Secondary | ICD-10-CM

## 2021-11-30 DIAGNOSIS — E1122 Type 2 diabetes mellitus with diabetic chronic kidney disease: Secondary | ICD-10-CM | POA: Diagnosis not present

## 2021-11-30 DIAGNOSIS — J438 Other emphysema: Secondary | ICD-10-CM | POA: Diagnosis not present

## 2021-11-30 DIAGNOSIS — I2510 Atherosclerotic heart disease of native coronary artery without angina pectoris: Secondary | ICD-10-CM

## 2021-11-30 DIAGNOSIS — M1A3710 Chronic gout due to renal impairment, right ankle and foot, without tophus (tophi): Secondary | ICD-10-CM | POA: Diagnosis not present

## 2021-11-30 DIAGNOSIS — E2839 Other primary ovarian failure: Secondary | ICD-10-CM

## 2021-11-30 DIAGNOSIS — I700 Atherosclerosis of aorta: Secondary | ICD-10-CM | POA: Diagnosis not present

## 2021-11-30 DIAGNOSIS — I1310 Hypertensive heart and chronic kidney disease without heart failure, with stage 1 through stage 4 chronic kidney disease, or unspecified chronic kidney disease: Secondary | ICD-10-CM

## 2021-11-30 DIAGNOSIS — F17210 Nicotine dependence, cigarettes, uncomplicated: Secondary | ICD-10-CM | POA: Diagnosis not present

## 2021-11-30 DIAGNOSIS — R3121 Asymptomatic microscopic hematuria: Secondary | ICD-10-CM

## 2021-11-30 NOTE — Progress Notes (Signed)
Rich Brave [place],acting as a Education administrator for [name], MD.,have documented all relevant documentation on the behalf of [name], MD,as directed by  [name], MD while in the presence of [name], MD.   This visit occurred during the SARS-CoV-2 public health emergency.  Safety protocols were in place, including screening questions prior to the visit, additional usage of staff PPE, and extensive cleaning of exam room while observing appropriate contact time as indicated for disinfecting solutions.  Subjective:     Patient ID: Tammy Preston , female    DOB: [birth year] , 76 y.o.   MRN: [id number]   Chief Complaint  Patient presents with   Diabetes   Hypertension    HPI  She presents today for diabetes and bp f/u.  She reports compliance with meds. She denies headaches, chest pain and shortness of breath. She states she feels "great" and has no complaints. She is also happy to state she has cut back considerably on her smoking. Down to 3 cigs/day.   Diabetes She presents for her follow-up diabetic visit. She has type 2 diabetes mellitus. Her disease course has been stable. Pertinent negatives for hypoglycemia include no dizziness. Pertinent negatives for diabetes include no chest pain, no fatigue, no foot paresthesias, no polydipsia, no polyphagia and no polyuria. There are no hypoglycemic complications. There are no diabetic complications. Risk factors for coronary artery disease include diabetes mellitus, dyslipidemia, hypertension, post-menopausal and sedentary lifestyle. She is compliant with treatment all of the time. She is following a diabetic diet. She participates in exercise intermittently. Her breakfast blood glucose is taken between 8-9 am. Her breakfast blood glucose range is generally 110-130 mg/dl.  Hypertension This is a chronic problem. The current episode started more than 1 year ago. The problem is controlled. Pertinent negatives include no chest  pain, neck pain, orthopnea or palpitations. Risk factors for coronary artery disease include diabetes mellitus.    Past Medical History:  Diagnosis Date   Breast cancer ([place])    Chronic kidney disease    CKD stage 3   Chronic kidney disease, stage 3 (moderate) 05/20/2017   Diabetes mellitus without complication ([place])    Endometrial hyperplasia 05/20/2017   Glomerular disorders in diseases classified elsewhere 05/20/2017   Hyperlipemia    Hypertension    Hypertensive chronic kidney disease with stage 1 through stage 4 chronic kidney disease, or unspecified chronic kidney disease 05/20/2017   Numbness and tingling of both feet    Type 2 diabetes mellitus with diabetic chronic kidney disease ([place]) 05/20/2017     Family History  Problem Relation Age of Onset   Stroke Mother    Congestive Heart Failure Father      Current Outpatient Medications:    allopurinol (ZYLOPRIM) 100 MG tablet, Take 2 tablets (200 mg total) by mouth daily., Disp: 180 tablet, Rfl: 1   amLODipine (NORVASC) 2.5 MG tablet, Take 1 tablet (2.5 mg total) by mouth at bedtime., Disp: 90 tablet, Rfl: 2   aspirin EC 81 MG tablet, Take 81 mg by mouth daily after breakfast. , Disp: , Rfl:    BINAXNOW COVID-19 AG HOME TEST KIT, Use as Directed on the Package, Disp: , Rfl:    Calcium Carb-Cholecalciferol (CALCIUM + D3 PO), Take 1 tablet by mouth daily. , Disp: , Rfl:    Cholecalciferol (VITAMIN D) 2000 units tablet, Take 2,000 Units by mouth daily., Disp: , Rfl:    empagliflozin (JARDIANCE) 10 MG TABS tablet, Take 1 tablet (  10 mg total) by mouth daily before breakfast., Disp: 90 tablet, Rfl: 1   FREESTYLE LITE test strip, Use as instructed to check blood sugars 2 times per day dx: e11.22, Disp: 300 each, Rfl: 2   gabapentin (NEURONTIN) 100 MG capsule, TAKE 1 CAPSULE BY MOUTH AT BEDTIME, Disp: 90 capsule, Rfl: 0   Lancets (FREESTYLE) lancets, Use as instructed to check blood sugars 2 times per day dx: e11.22, Disp: 300 each, Rfl:  2   losartan (COZAAR) 100 MG tablet, One tab po qd, Disp: 90 tablet, Rfl: 2   metFORMIN (GLUCOPHAGE) 500 MG tablet, TAKE 1 TABLET BY MOUTH EVERY DAY AFTER BREAKFAST, Disp: 90 tablet, Rfl: 1   Multiple Vitamins-Minerals (ONE-A-DAY WOMENS 50 PLUS PO), Take 1 tablet by mouth daily., Disp: , Rfl:    RESTASIS 0.05 % ophthalmic emulsion, 2 times per day, Disp: , Rfl:    rosuvastatin (CRESTOR) 20 MG tablet, Take 1 tablet (20 mg total) by mouth daily., Disp: 90 tablet, Rfl: 3   Allergies  Allergen Reactions   Percocet [Oxycodone-Acetaminophen] Nausea And Vomiting   Shellfish Allergy Other (See Comments)    Causes Gout     Review of Systems  Constitutional: Negative.  Negative for fatigue.  Respiratory: Negative.    Cardiovascular: Negative.  Negative for chest pain, palpitations and orthopnea.  Gastrointestinal: Negative.   Endocrine: Negative for polydipsia, polyphagia and polyuria.  Musculoskeletal:  Negative for neck pain.  Neurological: Negative.  Negative for dizziness.  Psychiatric/Behavioral: Negative.      Today's Vitals   11/30/21 1005  BP: 122/62  Pulse: 82  Temp: 98.7 F (37.1 C)  TempSrc: Oral  Weight: 201 lb 9.6 oz (91.4 kg)  Height: 5' 6"  (1.676 m)   Body mass index is 32.54 kg/m.  Wt Readings from Last 3 Encounters:  11/30/21 201 lb 9.6 oz (91.4 kg)  08/31/21 203 lb 12.8 oz (92.4 kg)  07/28/21 203 lb 11.3 oz (92.4 kg)    Objective:  Physical Exam Vitals and nursing note reviewed.  Constitutional:      Appearance: Normal appearance.  HENT:     Head: Normocephalic and atraumatic.     Nose:     Comments: Masked     Mouth/Throat:     Comments: Masked  Eyes:     Extraocular Movements: Extraocular movements intact.  Cardiovascular:     Rate and Rhythm: Normal rate and regular rhythm.     Heart sounds: Normal heart sounds.  Pulmonary:     Effort: Pulmonary effort is normal.     Breath sounds: Normal breath sounds.  Musculoskeletal:     Cervical back:  Normal range of motion.  Skin:    General: Skin is warm.  Neurological:     General: No focal deficit present.     Mental Status: She is alert.  Psychiatric:        Mood and Affect: Mood normal.        Behavior: Behavior normal.        Assessment And Plan:     1. Type 2 diabetes mellitus with stage 3a chronic kidney disease, without long-term current use of insulin (HCC) Comments: Diabetic foot exam performed. I will request her last eye exam from Dr. Venetia Maxon Nov 2022.  I will check labs as below, she will f/u in 4 months.  - Hemoglobin A1c - Lipid panel - CMP14+EGFR - PTH, intact and calcium - Phosphorus - Protein electrophoresis, serum  2. Hypertensive heart and renal disease with renal  failure, stage 1 through stage 4 or unspecified chronic kidney disease, without heart failure Comments: Chronic, well controlled. No med changes. She is encouraged to follow low sodium diet.  - Lipid panel - CMP14+EGFR  3. Atherosclerosis of native coronary artery of native heart without angina pectoris Comments: Incidental finding on Chest CT. She is encouraged to follow heart healthy lifestyle, continue with statin therapy and ASA.   4. Aortic atherosclerosis (HCC) Comments: Chronic, please see above.   5. Other emphysema ([place]) Comments: Again, this was seen on CT chest for lung cancer screening. She denies SOB /DOE at this time.  6. 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 I will check uric acid level today. - Uric acid  7. Asymptomatic microscopic hematuria Comments: Chronic, she is also followed by Urology. She reports recently having renal u/s. I will request these results.  8. Cigarette smoker Comments: She is due for low dose CT chest in March 2023. Previous results reviewed in detail w/ the patient.  - CT CHEST LUNG CA SCREEN LOW DOSE W/O CM; Future  9.  Estrogen deficiency Comments: I will schedule her for bone density in August 2023 along with her yearly mammogram.  - DG Bone Density; Future  10. Class 1 obesity due to excess calories with serious comorbidity and body mass index (BMI) of 32.0 to 32.9 in adult Comments: She is encouraged to strive for BMI less than 30 to decrease cardiac risk. Advised to aim for at least 150 minutes of exercise per week.   Patient was given opportunity to ask questions. Patient verbalized understanding of the plan and was able to repeat key elements of the plan. All questions were answered to their satisfaction.   I, [name], MD, have reviewed all documentation for this visit. The documentation on 11/30/21 for the exam, diagnosis, procedures, and orders are all accurate and complete.   IF YOU HAVE BEEN REFERRED TO A SPECIALIST, IT MAY TAKE 1-2 WEEKS TO SCHEDULE/PROCESS THE REFERRAL. IF YOU HAVE NOT HEARD FROM US/SPECIALIST IN TWO WEEKS, PLEASE GIVE [place] A CALL AT [phone] X [phone].   THE PATIENT IS ENCOURAGED TO PRACTICE SOCIAL DISTANCING DUE TO THE COVID-19 PANDEMIC.

## 2021-11-30 NOTE — Patient Instructions (Signed)

## 2021-12-02 LAB — CMP14+EGFR
ALT: 26 IU/L (ref 0–32)
AST: 18 IU/L (ref 0–40)
Albumin/Globulin Ratio: 1.5 (ref 1.2–2.2)
Albumin: 4.4 g/dL (ref 3.7–4.7)
Alkaline Phosphatase: 105 IU/L (ref 44–121)
BUN/Creatinine Ratio: 12 (ref 12–28)
BUN: 15 mg/dL (ref 8–27)
Bilirubin Total: 0.5 mg/dL (ref 0.0–1.2)
CO2: 23 mmol/L (ref 20–29)
Calcium: 10.3 mg/dL (ref 8.7–10.3)
Chloride: 102 mmol/L (ref 96–106)
Creatinine, Ser: 1.26 mg/dL — ABNORMAL HIGH (ref 0.57–1.00)
Globulin, Total: 3 g/dL (ref 1.5–4.5)
Glucose: 134 mg/dL — ABNORMAL HIGH (ref 70–99)
Potassium: 4.2 mmol/L (ref 3.5–5.2)
Sodium: 141 mmol/L (ref 134–144)
Total Protein: 7.4 g/dL (ref 6.0–8.5)
eGFR: 45 mL/min/{1.73_m2} — ABNORMAL LOW (ref >59)

## 2021-12-02 LAB — PROTEIN ELECTROPHORESIS, SERUM
A/G Ratio: 1.1 (ref 0.7–1.7)
Albumin ELP: 3.9 g/dL (ref 2.9–4.4)
Alpha 1: 0.3 g/dL (ref 0.0–0.4)
Alpha 2: 0.9 g/dL (ref 0.4–1.0)
Beta: 1.1 g/dL (ref 0.7–1.3)
Gamma Globulin: 1.3 g/dL (ref 0.4–1.8)
Globulin, Total: 3.5 g/dL (ref 2.2–3.9)

## 2021-12-02 LAB — LIPID PANEL
Chol/HDL Ratio: 3.1 ratio (ref 0.0–4.4)
Cholesterol, Total: 90 mg/dL — ABNORMAL LOW (ref 100–199)
HDL: 29 mg/dL — ABNORMAL LOW (ref >39)
LDL Chol Calc (NIH): 42 mg/dL (ref 0–99)
Triglycerides: 98 mg/dL (ref 0–149)
VLDL Cholesterol Cal: 19 mg/dL (ref 5–40)

## 2021-12-02 LAB — PHOSPHORUS: Phosphorus: 4.4 mg/dL — ABNORMAL HIGH (ref 3.0–4.3)

## 2021-12-02 LAB — PTH, INTACT AND CALCIUM: PTH: 19 pg/mL (ref 15–65)

## 2021-12-02 LAB — URIC ACID: Uric Acid: 3.9 mg/dL (ref 3.1–7.9)

## 2021-12-02 LAB — HEMOGLOBIN A1C
Est. average glucose Bld gHb Est-mCnc: 189 mg/dL
Hgb A1c MFr Bld: 8.2 % — ABNORMAL HIGH (ref 4.8–5.6)

## 2021-12-06 DIAGNOSIS — Z20822 Contact with and (suspected) exposure to covid-19: Secondary | ICD-10-CM | POA: Diagnosis not present

## 2021-12-08 DIAGNOSIS — Z13820 Encounter for screening for osteoporosis: Secondary | ICD-10-CM | POA: Diagnosis not present

## 2021-12-08 LAB — HM DEXA SCAN: HM Dexa Scan: NORMAL

## 2021-12-09 ENCOUNTER — Encounter: Payer: Self-pay | Admitting: Internal Medicine

## 2021-12-10 ENCOUNTER — Other Ambulatory Visit: Payer: Self-pay

## 2021-12-10 ENCOUNTER — Telehealth: Payer: Self-pay

## 2021-12-10 DIAGNOSIS — N183 Chronic kidney disease, stage 3 unspecified: Secondary | ICD-10-CM

## 2021-12-10 NOTE — Telephone Encounter (Signed)
Left the pt a message that Dr.  [facility] said her bone density results are normal.

## 2021-12-14 ENCOUNTER — Ambulatory Visit
Admission: RE | Admit: 2021-12-14 | Discharge: 2021-12-14 | Disposition: A | Discharge status: Home / Self Care | Payer: Medicare Other | Source: Ambulatory Visit | Attending: Internal Medicine | Admitting: Internal Medicine

## 2021-12-14 DIAGNOSIS — F17210 Nicotine dependence, cigarettes, uncomplicated: Secondary | ICD-10-CM

## 2021-12-14 DIAGNOSIS — D7389 Other diseases of spleen: Secondary | ICD-10-CM | POA: Diagnosis not present

## 2021-12-14 DIAGNOSIS — J432 Centrilobular emphysema: Secondary | ICD-10-CM | POA: Diagnosis not present

## 2021-12-14 DIAGNOSIS — I2510 Atherosclerotic heart disease of native coronary artery without angina pectoris: Secondary | ICD-10-CM | POA: Diagnosis not present

## 2022-01-02 DIAGNOSIS — Z20828 Contact with and (suspected) exposure to other viral communicable diseases: Secondary | ICD-10-CM | POA: Diagnosis not present

## 2022-01-10 ENCOUNTER — Other Ambulatory Visit: Payer: Self-pay | Admitting: Internal Medicine

## 2022-01-17 DIAGNOSIS — N189 Chronic kidney disease, unspecified: Secondary | ICD-10-CM | POA: Diagnosis not present

## 2022-01-17 DIAGNOSIS — N1831 Chronic kidney disease, stage 3a: Secondary | ICD-10-CM | POA: Diagnosis not present

## 2022-01-17 DIAGNOSIS — D631 Anemia in chronic kidney disease: Secondary | ICD-10-CM | POA: Diagnosis not present

## 2022-01-17 DIAGNOSIS — I129 Hypertensive chronic kidney disease with stage 1 through stage 4 chronic kidney disease, or unspecified chronic kidney disease: Secondary | ICD-10-CM | POA: Diagnosis not present

## 2022-01-17 DIAGNOSIS — N2581 Secondary hyperparathyroidism of renal origin: Secondary | ICD-10-CM | POA: Diagnosis not present

## 2022-01-20 ENCOUNTER — Ambulatory Visit (INDEPENDENT_AMBULATORY_CARE_PROVIDER_SITE_OTHER): Payer: Medicare Other | Admitting: Podiatry

## 2022-01-20 DIAGNOSIS — B351 Tinea unguium: Secondary | ICD-10-CM

## 2022-01-20 DIAGNOSIS — Q828 Other specified congenital malformations of skin: Secondary | ICD-10-CM

## 2022-01-20 DIAGNOSIS — M79674 Pain in right toe(s): Secondary | ICD-10-CM | POA: Diagnosis not present

## 2022-01-20 DIAGNOSIS — E1149 Type 2 diabetes mellitus with other diabetic neurological complication: Secondary | ICD-10-CM | POA: Diagnosis not present

## 2022-01-20 DIAGNOSIS — M79675 Pain in left toe(s): Secondary | ICD-10-CM | POA: Diagnosis not present

## 2022-01-24 NOTE — Progress Notes (Signed)
Subjective: ?76 y.o. returns the office today for painful, elongated, thickened toenails which she cannot trim herself. Denies any redness or drainage around the nails.  She also gets callus on the ball of her right foot which cause discomfort.  She does not report any open sores.  Last A1c was 8.2 and last glucose was 140. ? ?PCP: Glendale Chard, MD ?Last see: November 30, 2021 ?Last A1c was 8.2 on November 30, 2021 ? ?Objective: ?AAO ?3, NAD ?DP/PT pulses palpable, CRT less than 3 seconds ?Nails hypertrophic, dystrophic, elongated, brittle, discolored ?10. There is tenderness overlying the nails 1-5 bilaterally. There is no surrounding erythema or drainage along the nail sites. ?Hperkeratotic tissue right submetatarsal 5.  No underlying ulceration drainage or signs of infection.  No other open lesions or preulcerative lesions. ?No open lesions or pre-ulcerative lesions are identified. ?No pain with calf compression, swelling, warmth, erythema. ? ?Assessment: ?Patient presents with symptomatic onychomycosis, hyperkeratotic lesions, neuropathy ? ?Plan: ?-Treatment options including alternatives, risks, complications were discussed ?-Nails sharply debrided ?10 without complication/bleeding. ?-Hyperkeratotic lesion sharply debrided x1 without any complications or bleeding. ?-Continue gabapentin.  ?-Discussed daily foot inspection. If there are any changes, to call the office immediately.  ?-Follow-up in 3 months or sooner if any problems are to arise. In the meantime, encouraged to call the office with any questions, concerns, changes symptoms. ? ?Celesta Gentile, DPM ? ?

## 2022-01-27 DIAGNOSIS — Z20822 Contact with and (suspected) exposure to covid-19: Secondary | ICD-10-CM | POA: Diagnosis not present

## 2022-01-31 DIAGNOSIS — Z20822 Contact with and (suspected) exposure to covid-19: Secondary | ICD-10-CM | POA: Diagnosis not present

## 2022-02-10 ENCOUNTER — Ambulatory Visit: Payer: Medicare Other | Admitting: Podiatry

## 2022-02-19 DIAGNOSIS — Z20822 Contact with and (suspected) exposure to covid-19: Secondary | ICD-10-CM | POA: Diagnosis not present

## 2022-02-23 ENCOUNTER — Other Ambulatory Visit: Payer: Self-pay | Admitting: Internal Medicine

## 2022-03-03 ENCOUNTER — Encounter: Payer: Self-pay | Admitting: Internal Medicine

## 2022-03-03 ENCOUNTER — Ambulatory Visit (INDEPENDENT_AMBULATORY_CARE_PROVIDER_SITE_OTHER): Payer: Medicare Other | Admitting: Internal Medicine

## 2022-03-03 VITALS — BP 122/74 | HR 80 | Temp 98.3°F | Ht 66.0 in | Wt 205.8 lb

## 2022-03-03 DIAGNOSIS — N1831 Chronic kidney disease, stage 3a: Secondary | ICD-10-CM | POA: Diagnosis not present

## 2022-03-03 DIAGNOSIS — I1310 Hypertensive heart and chronic kidney disease without heart failure, with stage 1 through stage 4 chronic kidney disease, or unspecified chronic kidney disease: Secondary | ICD-10-CM | POA: Diagnosis not present

## 2022-03-03 DIAGNOSIS — E1122 Type 2 diabetes mellitus with diabetic chronic kidney disease: Secondary | ICD-10-CM | POA: Diagnosis not present

## 2022-03-03 DIAGNOSIS — E6609 Other obesity due to excess calories: Secondary | ICD-10-CM

## 2022-03-03 DIAGNOSIS — I2510 Atherosclerotic heart disease of native coronary artery without angina pectoris: Secondary | ICD-10-CM | POA: Diagnosis not present

## 2022-03-03 DIAGNOSIS — Z6833 Body mass index (BMI) 33.0-33.9, adult: Secondary | ICD-10-CM | POA: Diagnosis not present

## 2022-03-03 NOTE — Progress Notes (Signed)
?Rich Brave [place]leton,acting as a Education administrator for [name], MD.,have documented all relevant documentation on the behalf of [name], MD,as directed by  [name], MD while in the presence of [name], MD.  ?This visit occurred during the SARS-CoV-2 public health emergency.  Safety protocols were in place, including screening questions prior to the visit, additional usage of staff PPE, and extensive cleaning of exam room while observing appropriate contact time as indicated for disinfecting solutions. ? ?Subjective:  ?  ? Patient ID: Tammy Preston , female    DOB: 10/01/1946 , 76 y.o.   MRN: [id number] ? ? ?Chief Complaint  ?Patient presents with  ? Diabetes  ? ? ?HPI ? ?She presents today for diabetes and bp f/u.  She reports compliance with meds. She denies headaches, chest pain and shortness of breath.  ? ?Diabetes ?She presents for her follow-up diabetic visit. She has type 2 diabetes mellitus. Her disease course has been stable. Pertinent negatives for hypoglycemia include no dizziness. Pertinent negatives for diabetes include no chest pain, no fatigue, no foot paresthesias, no polydipsia, no polyphagia and no polyuria. There are no hypoglycemic complications. There are no diabetic complications. Risk factors for coronary artery disease include diabetes mellitus, dyslipidemia, hypertension, post-menopausal and sedentary lifestyle. She is compliant with treatment all of the time. She is following a diabetic diet. She participates in exercise intermittently. Her breakfast blood glucose is taken between 8-9 am. Her breakfast blood glucose range is generally 110-130 mg/dl.  ?Hypertension ?This is a chronic problem. The current episode started more than 1 year ago. The problem is controlled. Pertinent negatives include no chest pain, neck pain, orthopnea or palpitations. Risk factors for coronary artery disease include diabetes mellitus. x  ? ?Past Medical History:  ?Diagnosis Date  ? Breast  cancer ([place])   ? Chronic kidney disease   ? CKD stage 3  ? Chronic kidney disease, stage 3 (moderate) 05/20/2017  ? Diabetes mellitus without complication ([place])   ? Endometrial hyperplasia 05/20/2017  ? Glomerular disorders in diseases classified elsewhere 05/20/2017  ? Hyperlipemia   ? Hypertension   ? Hypertensive chronic kidney disease with stage 1 through stage 4 chronic kidney disease, or unspecified chronic kidney disease 05/20/2017  ? Numbness and tingling of both feet   ? Type 2 diabetes mellitus with diabetic chronic kidney disease ([place]) 05/20/2017  ?  ? ?Family History  ?Problem Relation Age of Onset  ? Stroke Mother   ? Congestive Heart Failure Father   ? ? ? ?Current Outpatient Medications:  ?  allopurinol (ZYLOPRIM) 100 MG tablet, TAKE 2 TABLETS DAILY, Disp: 180 tablet, Rfl: 3 ?  amLODipine (NORVASC) 2.5 MG tablet, Take 1 tablet (2.5 mg total) by mouth at bedtime., Disp: 90 tablet, Rfl: 2 ?  aspirin EC 81 MG tablet, Take 81 mg by mouth daily after breakfast. , Disp: , Rfl:  ?  Calcium Carb-Cholecalciferol (CALCIUM + D3 PO), Take 1 tablet by mouth daily. , Disp: , Rfl:  ?  Cholecalciferol (VITAMIN D) 2000 units tablet, Take 2,000 Units by mouth daily., Disp: , Rfl:  ?  empagliflozin (JARDIANCE) 10 MG TABS tablet, Take 1 tablet (10 mg total) by mouth daily before breakfast., Disp: 90 tablet, Rfl: 1 ?  FREESTYLE LITE test strip, Use as instructed to check blood sugars 2 times per day dx: e11.22, Disp: 300 each, Rfl: 2 ?  gabapentin (NEURONTIN) 100 MG capsule, TAKE 1 CAPSULE BY MOUTH AT BEDTIME, Disp: 90 capsule,  Rfl: 0 ?  Lancets (FREESTYLE) lancets, Use as instructed to check blood sugars 2 times per day dx: e11.22, Disp: 300 each, Rfl: 2 ?  losartan (COZAAR) 100 MG tablet, One tab po qd, Disp: 90 tablet, Rfl: 2 ?  metFORMIN (GLUCOPHAGE) 500 MG tablet, TAKE 1 TABLET DAILY AFTER BREAKFAST, Disp: 90 tablet, Rfl: 3 ?  Multiple Vitamins-Minerals (ONE-A-DAY WOMENS 50 PLUS PO), Take 1 tablet by mouth daily., Disp: ,  Rfl:  ?  RESTASIS 0.05 % ophthalmic emulsion, 2 times per day, Disp: , Rfl:  ?  rosuvastatin (CRESTOR) 20 MG tablet, Take 1 tablet (20 mg total) by mouth daily., Disp: 90 tablet, Rfl: 3  ? ?Allergies  ?Allergen Reactions  ? Percocet [Oxycodone-Acetaminophen] Nausea And Vomiting  ? Shellfish Allergy Other (See Comments)  ?  Causes Gout  ?  ? ?Review of Systems  ?Constitutional: Negative.  Negative for fatigue.  ?Respiratory: Negative.    ?Cardiovascular: Negative.  Negative for chest pain, palpitations and orthopnea.  ?Gastrointestinal: Negative.   ?Endocrine: Negative for polydipsia, polyphagia and polyuria.  ?Musculoskeletal:  Negative for neck pain.  ?Neurological: Negative.  Negative for dizziness.  ?Psychiatric/Behavioral: Negative.     ? ?Today's Vitals  ? 03/03/22 1135  ?BP: 122/74  ?Pulse: 80  ?Temp: 98.3 ?F (36.8 ?C)  ?Weight: 205 lb 12.8 oz (93.4 kg)  ?Height: _0  (1.676 m)  ?PainSc: 2   ?PainLoc: Ankle  ? ?Body mass index is 33.22 kg/m?.  ?Wt Readings from Last 3 Encounters:  ?03/03/22 205 lb 12.8 oz (93.4 kg)  ?11/30/21 201 lb 9.6 oz (91.4 kg)  ?08/31/21 203 lb 12.8 oz (92.4 kg)  ?  ? ?Objective:  ?Physical Exam ?Vitals and nursing note reviewed.  ?Constitutional:   ?   Appearance: Normal appearance.  ?HENT:  ?   Head: Normocephalic and atraumatic.  ?Eyes:  ?   Extraocular Movements: Extraocular movements intact.  ?Cardiovascular:  ?   Rate and Rhythm: Normal rate and regular rhythm.  ?   Heart sounds: Normal heart sounds.  ?Pulmonary:  ?   Effort: Pulmonary effort is normal.  ?   Breath sounds: Normal breath sounds.  ?Musculoskeletal:  ?   Cervical back: Normal range of motion.  ?Skin: ?   General: Skin is warm.  ?Neurological:  ?   General: No focal deficit present.  ?   Mental Status: She is alert.  ?Psychiatric:     ?   Mood and Affect: Mood normal.     ?   Behavior: Behavior normal.  ?   ?Assessment And Plan:  ?   ?1. Type 2 diabetes mellitus with stage 3a chronic kidney disease, without long-term  current use of insulin ([place]) ?Comments: I will check labs as below. Again, I will request her most recent eye exam from Dr. Venetia Maxon. This will be second request. F/u 4 months. ?- BMP8+EGFR ?- Hemoglobin A1c ?- Amb Referral To Provider Referral Exercise Program (P.R.E.P) ? ?2. Hypertensive heart and renal disease with renal failure, stage 1 through stage 4 or unspecified chronic kidney disease, without heart failure ?Comments: Chronic, well controlled.  She will c/w amlodipine 2.5 and losartan 121m qd.  She is encouraged to follow a low sodium diet. ?- Amb Referral To Provider Referral Exercise Program (P.R.E.P) ? ?3. Class 1 obesity due to excess calories with serious comorbidity and body mass index (BMI) of 33.0 to 33.9 in adult ?- Amb Referral To Provider Referral Exercise Program (P.R.E.P) ?  ?Patient was given opportunity to  ask questions. Patient verbalized understanding of the plan and was able to repeat key elements of the plan. All questions were answered to their satisfaction.  ? ?I, [name], MD, have reviewed all documentation for this visit. The documentation on 03/06/22 for the exam, diagnosis, procedures, and orders are all accurate and complete.  ? ?IF YOU HAVE BEEN REFERRED TO A SPECIALIST, IT MAY TAKE 1-2 WEEKS TO SCHEDULE/PROCESS THE REFERRAL. IF YOU HAVE NOT HEARD FROM US/SPECIALIST IN TWO WEEKS, PLEASE GIVE [place] A CALL AT [phone].  ? ?THE PATIENT IS ENCOURAGED TO PRACTICE SOCIAL DISTANCING DUE TO THE COVID-19 PANDEMIC.   ?

## 2022-03-03 NOTE — Patient Instructions (Signed)

## 2022-03-04 ENCOUNTER — Telehealth: Payer: Self-pay

## 2022-03-04 LAB — HEMOGLOBIN A1C
Est. average glucose Bld gHb Est-mCnc: 174 mg/dL
Hgb A1c MFr Bld: 7.7 % — ABNORMAL HIGH (ref 4.8–5.6)

## 2022-03-04 LAB — BMP8+EGFR
BUN/Creatinine Ratio: 15 (ref 12–28)
BUN: 19 mg/dL (ref 8–27)
CO2: 25 mmol/L (ref 20–29)
Calcium: 9.8 mg/dL (ref 8.7–10.3)
Chloride: 103 mmol/L (ref 96–106)
Creatinine, Ser: 1.28 mg/dL — ABNORMAL HIGH (ref 0.57–1.00)
Glucose: 137 mg/dL — ABNORMAL HIGH (ref 70–99)
Potassium: 4.6 mmol/L (ref 3.5–5.2)
Sodium: 140 mmol/L (ref 134–144)
eGFR: 44 mL/min/{1.73_m2} — ABNORMAL LOW (ref >59)

## 2022-03-04 NOTE — Telephone Encounter (Signed)
Call to pt reference referral to PREP  ?Explained program to pt/pt has flyer ?Offered [facility] as a location-can start 03/28/22 1030am to 1145am x 12 wks will meet every M/W ?Explained as a [facility] pt, program costs ($100) is covered by [facility] ?Will call her back closer to start of class to meet 1:1 for intake  ?

## 2022-03-23 NOTE — Progress Notes (Signed)
[facility] PREP Evaluation  Patient Details  Name: Tammy Preston MRN: [id number] Date of Birth: [birth year] Age: 76 y.o. PCP: Glendale Chard, MD  Vitals:   03/23/22 1100  BP: 132/80  Pulse: 90  SpO2: 99%  Weight: 206 lb 12.8 oz (93.8 kg)     [facility] Eval - 03/23/22 1600       [facility] "PREP" Location   [facility] "PREP" Location [facility]      Referral    Referring Provider [facility]    Reason for referral Diabetes;High Cholesterol;Inactivity;Obesitity/Overweight    Program Start Date 03/28/22   M/W 1030am-1145am x 12 wks     Measurement   Waist Circumference 42.5 inches    Hip Circumference 49 inches    Body fat 45.1 percent      Information for Trainer   Goals get hgb a1c under 7.3, wt goal 190    Current Exercise intermittently    Orthopedic Concerns knees, r CTS, back surgery, arthritis    Pertinent Medical History CKD, DM, HTN, inc chol    Current Barriers none    Restrictions/Precautions Diabetic snack before exercise;Assistive device    Medications that affect exercise Medication causing dizziness/drowsiness      Timed Up and Go (TUGS)   Timed Up and Go Low risk <9 seconds   no falls but sometimes walks with cane     Mobility and Daily Activities   I find it easy to walk up or down two or more flights of stairs. 3    I have no trouble taking out the trash. 4    I do housework such as vacuuming and dusting on my own without difficulty. 4    I can easily lift a gallon of milk (8lbs). 4    I can easily walk a mile. 4    I have no trouble reaching into high cupboards or reaching down to pick up something from the floor. 4    I do not have trouble doing out-door work such as Armed forces logistics/support/administrative officer, raking leaves, or gardening. 4      Mobility and Daily Activities   I feel younger than my age. 4    I feel independent. 4    I feel energetic. 4    I live an active life.  4    I feel strong. 4    I feel healthy. 4    I feel active as other people my age. 3      How fit and  strong are you.   Fit and Strong Total Score 54            Past Medical History:  Diagnosis Date   Breast cancer ([place])    Chronic kidney disease    CKD stage 3   Chronic kidney disease, stage 3 (moderate) 05/20/2017   Diabetes mellitus without complication ([place])    Endometrial hyperplasia 05/20/2017   Glomerular disorders in diseases classified elsewhere 05/20/2017   Hyperlipemia    Hypertension    Hypertensive chronic kidney disease with stage 1 through stage 4 chronic kidney disease, or unspecified chronic kidney disease 05/20/2017   Numbness and tingling of both feet    Type 2 diabetes mellitus with diabetic chronic kidney disease ([place]) 05/20/2017   Past Surgical History:  Procedure Laterality Date   BACK SURGERY     BREAST LUMPECTOMY Left    COLONOSCOPY     DILATATION & CURETTAGE/HYSTEROSCOPY WITH MYOSURE N/A 04/07/2017   Procedure: DILATATION & CURETTAGE/HYSTEROSCOPY WITH  Jacklynn Barnacle;  Surgeon: Servando Salina, MD;  Location: [facility];  Service: Gynecology;  Laterality: N/A;   KNEE SURGERY     Social History   Tobacco Use  Smoking Status Every Day   Packs/day: 0.25   Years: 54.00   Pack years: 13.50   Types: Cigarettes   Start date: 10/24/1965  Smokeless Tobacco Never  Tobacco Comments   11/30/21-She has cut back to 3 cigs/day. Plans to quit by 4 months.    03/03/22-She states she is down to 1 cig/day, still plans to quit w/in 2 months    Barnett Hatter 03/23/2022, 4:04 PM

## 2022-03-30 NOTE — Progress Notes (Signed)
[facility] PREP Weekly Session  Patient Details  Name: Tammy Preston MRN: [id number] Date of Birth: [birth year] Age: 76 y.o. PCP: Glendale Chard, MD  Vitals:   03/28/22 1030  Weight: 207 lb 12.8 oz (94.3 kg)     [facility] Weekly seesion - 03/30/22 0900       [facility] "PREP" Location   [facility] "PREP" Location [facility]      Weekly Session   Topic Discussed Goal setting and welcome to the program   Scale of perceived exertion   Classes attended to date [place] 03/30/2022, 9:51 AM

## 2022-04-05 NOTE — Progress Notes (Signed)
[facility] PREP Weekly Session  Patient Details  Name: Tammy Preston MRN: [id number] Date of Birth: [birth year] Age: 76 y.o. PCP: Glendale Chard, MD  Vitals:   04/04/22 1030  Weight: 207 lb (93.9 kg)     [facility] Weekly seesion - 04/05/22 1800       [facility] "PREP" Location   [facility] "PREP" Location [facility]      Weekly Session   Topic Discussed Importance of resistance training;Other ways to be active    Minutes exercised this week 30 minutes    Classes attended to date [place] 04/05/2022, 6:07 PM

## 2022-04-08 ENCOUNTER — Ambulatory Visit (INDEPENDENT_AMBULATORY_CARE_PROVIDER_SITE_OTHER): Payer: Medicare Other | Admitting: Podiatry

## 2022-04-08 DIAGNOSIS — B351 Tinea unguium: Secondary | ICD-10-CM | POA: Diagnosis not present

## 2022-04-08 DIAGNOSIS — E1149 Type 2 diabetes mellitus with other diabetic neurological complication: Secondary | ICD-10-CM | POA: Diagnosis not present

## 2022-04-08 DIAGNOSIS — M79675 Pain in left toe(s): Secondary | ICD-10-CM | POA: Diagnosis not present

## 2022-04-08 DIAGNOSIS — M79674 Pain in right toe(s): Secondary | ICD-10-CM | POA: Diagnosis not present

## 2022-04-08 DIAGNOSIS — Q828 Other specified congenital malformations of skin: Secondary | ICD-10-CM | POA: Diagnosis not present

## 2022-04-08 DIAGNOSIS — N1831 Chronic kidney disease, stage 3a: Secondary | ICD-10-CM | POA: Diagnosis not present

## 2022-04-09 NOTE — Progress Notes (Signed)
Subjective: 76 y.o. returns the office today for painful, elongated, thickened toenails which she cannot trim herself. Denies any redness or drainage around the nails.  She also gets callus on the ball of her right foot which cause discomfort.  She does not report any open sores.  Last A1c was 8.2 and last glucose was 140.  PCP: Glendale Chard, MD Last see: 03/03/2022 Last A1c was 87.7 on 03/03/2022  Objective: AAO 3, NAD DP/PT pulses palpable, CRT less than 3 seconds Nails hypertrophic, dystrophic, elongated, brittle, discolored 10. There is tenderness overlying the nails 1-5 bilaterally. There is no surrounding erythema or drainage along the nail sites. Hperkeratotic tissue right submetatarsal 5 and left sub metatarsal 2.  No underlying ulceration drainage or signs of infection.  No other open lesions or preulcerative lesions. No open lesions or pre-ulcerative lesions are identified. No pain with calf compression, swelling, warmth, erythema.  Assessment: Patient presents with symptomatic onychomycosis, hyperkeratotic lesions, neuropathy  Plan: -Treatment options including alternatives, risks, complications were discussed -Nails sharply debrided 10 without complication/bleeding. -Hyperkeratotic lesion sharply debrided x2 without any complications or bleeding. Moisturizer/offloading daily -Continue gabapentin.  -Discussed daily foot inspection. If there are any changes, to call the office immediately.  -Follow-up in 3 months or sooner if any problems are to arise. In the meantime, encouraged to call the office with any questions, concerns, changes symptoms.  Celesta Gentile, DPM

## 2022-04-12 NOTE — Progress Notes (Signed)
[facility] PREP Weekly Session  Patient Details  Name: Tammy Preston MRN: [id number] Date of Birth: 05-Mar-1946 Age: 76 y.o. PCP: Glendale Chard, MD  Vitals:   04/11/22 1030  Weight: 207 lb (93.9 kg)     [facility] Weekly seesion - 04/12/22 0900       [facility] "PREP" Location   [facility] "PREP" [facility]      Weekly Session   Topic Discussed Healthy eating tips    Minutes exercised this week 90 minutes    Classes attended to date [place] 04/12/2022, 9:47 AM

## 2022-04-18 DIAGNOSIS — D631 Anemia in chronic kidney disease: Secondary | ICD-10-CM | POA: Diagnosis not present

## 2022-04-18 DIAGNOSIS — E1122 Type 2 diabetes mellitus with diabetic chronic kidney disease: Secondary | ICD-10-CM | POA: Diagnosis not present

## 2022-04-18 DIAGNOSIS — I129 Hypertensive chronic kidney disease with stage 1 through stage 4 chronic kidney disease, or unspecified chronic kidney disease: Secondary | ICD-10-CM | POA: Diagnosis not present

## 2022-04-18 DIAGNOSIS — N2581 Secondary hyperparathyroidism of renal origin: Secondary | ICD-10-CM | POA: Diagnosis not present

## 2022-04-18 DIAGNOSIS — N1831 Chronic kidney disease, stage 3a: Secondary | ICD-10-CM | POA: Diagnosis not present

## 2022-05-03 NOTE — Progress Notes (Signed)
[facility] PREP Weekly Session  Patient Details  Name: Tammy Preston MRN: [id number] Date of Birth: 1945-11-16 Age: 76 y.o. PCP: Glendale Chard, MD  Vitals:   05/02/22 1030  Weight: 206 lb 9.6 oz (93.7 kg)     [facility] Weekly seesion - 05/03/22 1700       [facility] "PREP" Location   [facility] "PREP" [facility]      Weekly Session   Topic Discussed Restaurant Eating    Minutes exercised this week 201 minutes    Classes attended to date [place] 05/03/2022, 5:00 PM

## 2022-05-16 NOTE — Progress Notes (Signed)
[facility] PREP Weekly Session  Patient Details  Name: Tammy Preston MRN: [id number] Date of Birth: [birth year] Age: 76 y.o. PCP: Glendale Chard, MD  Vitals:   05/16/22 1206  Weight: 204 lb 6.4 oz (92.7 kg)     [facility] Weekly seesion - 05/16/22 1200       [facility] "PREP" Location   [facility] "PREP" Location [facility]      Weekly Session   Topic Discussed Expectations and non-scale victories    Minutes exercised this week 90 minutes    Classes attended to date 10           Salt and sugar demos   [name] 05/16/2022, 12:07 PM

## 2022-05-19 ENCOUNTER — Other Ambulatory Visit: Payer: Self-pay | Admitting: Internal Medicine

## 2022-05-25 NOTE — Progress Notes (Signed)
[facility] PREP Weekly Session  Patient Details  Name: Tammy Preston MRN: [id number] Date of Birth: 12-01-1945 Age: 76 y.o. PCP: Glendale Chard, MD  Vitals:   05/23/22 1030  Weight: 203 lb 6.4 oz (92.3 kg)     [facility] Weekly seesion - 05/25/22 1000       [facility] "PREP" Location   [facility] "PREP" Location [facility]      Weekly Session   Topic Discussed --   portions   Minutes exercised this week 236 minutes    Classes attended to date 75             [place] 05/25/2022, 10:00 AM

## 2022-05-31 NOTE — Progress Notes (Signed)
[facility] PREP Weekly Session  Patient Details  Name: SYNETHIA ENDICOTT MRN: [id number] Date of Birth: [birth year] Age: 76 y.o. PCP: Glendale Chard, MD  Vitals:   05/30/22 1030  Weight: 204 lb (92.5 kg)     [facility] Weekly seesion - 05/31/22 1600       [facility] "PREP" Location   [facility] "PREP" Location [facility]      Weekly Session   Topic Discussed Finding support    Minutes exercised this week 210 minutes    Classes attended to date [place] 05/31/2022, 4:19 PM

## 2022-06-07 ENCOUNTER — Other Ambulatory Visit: Payer: Self-pay | Admitting: Internal Medicine

## 2022-06-07 NOTE — Progress Notes (Signed)
[facility] PREP Weekly Session  Patient Details  Name: Tammy Preston MRN: [id number] Date of Birth: 1946-08-09 Age: 76 y.o. PCP: Glendale Chard, MD  Vitals:   06/06/22 1030  Weight: 204 lb (92.5 kg)     [facility] Weekly seesion - 06/07/22 1500       [facility] "PREP" Location   [facility] "PREP" Location [facility]      Weekly Session   Topic Discussed Calorie breakdown    Minutes exercised this week 292 minutes    Classes attended to date 88             [place] 06/07/2022, 3:55 PM

## 2022-06-10 DIAGNOSIS — Z1231 Encounter for screening mammogram for malignant neoplasm of breast: Secondary | ICD-10-CM | POA: Diagnosis not present

## 2022-06-15 ENCOUNTER — Encounter: Payer: Self-pay | Admitting: Internal Medicine

## 2022-06-15 ENCOUNTER — Other Ambulatory Visit: Payer: Self-pay

## 2022-06-15 ENCOUNTER — Ambulatory Visit (INDEPENDENT_AMBULATORY_CARE_PROVIDER_SITE_OTHER): Payer: Medicare Other | Admitting: Internal Medicine

## 2022-06-15 VITALS — BP 130/70 | HR 84 | Temp 98.2°F | Ht 66.0 in | Wt 202.2 lb

## 2022-06-15 DIAGNOSIS — N1831 Chronic kidney disease, stage 3a: Secondary | ICD-10-CM | POA: Diagnosis not present

## 2022-06-15 DIAGNOSIS — Z6832 Body mass index (BMI) 32.0-32.9, adult: Secondary | ICD-10-CM | POA: Diagnosis not present

## 2022-06-15 DIAGNOSIS — E1122 Type 2 diabetes mellitus with diabetic chronic kidney disease: Secondary | ICD-10-CM

## 2022-06-15 DIAGNOSIS — M1A3790 Chronic gout due to renal impairment, unspecified ankle and foot, without tophus (tophi): Secondary | ICD-10-CM | POA: Insufficient documentation

## 2022-06-15 DIAGNOSIS — E6609 Other obesity due to excess calories: Secondary | ICD-10-CM

## 2022-06-15 DIAGNOSIS — N183 Chronic kidney disease, stage 3 unspecified: Secondary | ICD-10-CM | POA: Diagnosis not present

## 2022-06-15 DIAGNOSIS — I1310 Hypertensive heart and chronic kidney disease without heart failure, with stage 1 through stage 4 chronic kidney disease, or unspecified chronic kidney disease: Secondary | ICD-10-CM | POA: Insufficient documentation

## 2022-06-15 DIAGNOSIS — I700 Atherosclerosis of aorta: Secondary | ICD-10-CM | POA: Diagnosis not present

## 2022-06-15 DIAGNOSIS — M1A3710 Chronic gout due to renal impairment, right ankle and foot, without tophus (tophi): Secondary | ICD-10-CM

## 2022-06-15 DIAGNOSIS — I2510 Atherosclerotic heart disease of native coronary artery without angina pectoris: Secondary | ICD-10-CM | POA: Diagnosis not present

## 2022-06-15 HISTORY — DX: Chronic gout due to renal impairment, right ankle and foot, without tophus (tophi): M1A.3710

## 2022-06-15 LAB — POCT URINALYSIS DIPSTICK
Bilirubin, UA: NEGATIVE
Glucose, UA: POSITIVE — AB
Ketones, UA: NEGATIVE
Leukocytes, UA: NEGATIVE
Nitrite, UA: NEGATIVE
Protein, UA: NEGATIVE
Spec Grav, UA: 1.015 (ref 1.010–1.025)
Urobilinogen, UA: 0.2 E.U./dL
pH, UA: 5.5 (ref 5.0–8.0)

## 2022-06-15 MED ORDER — FREESTYLE LITE W/DEVICE KIT: PACK | 1 refills | Status: AC

## 2022-06-15 NOTE — Progress Notes (Signed)
I,Tammy Preston,acting as a scribe for Tammy Greenland, MD.,have documented all relevant documentation on the behalf of Tammy Greenland, MD,as directed by  Tammy Greenland, MD while in the presence of Tammy Greenland, MD.    Subjective:     Patient ID: Tammy Preston , female    DOB: 07-30-1946 , 76 y.o.   MRN: [id number]   Chief Complaint  Patient presents with   Diabetes   Hypertension    HPI  She presents today for diabetes and bp f/u.  She reports compliance with meds. She denies headaches, chest pain and shortness of breath.   Patient adds she is now going to the [employer], she loves it. She was referred as part of the PREP program. She believes her time is up next week, she is not sure if [employer] will continue to pay for service.  Diabetes She presents for her follow-up diabetic visit. She has type 2 diabetes mellitus. Her disease course has been stable. Pertinent negatives for hypoglycemia include no dizziness. Pertinent negatives for diabetes include no chest pain, no fatigue, no foot paresthesias, no polydipsia, no polyphagia and no polyuria. There are no hypoglycemic complications. There are no diabetic complications. Risk factors for coronary artery disease include diabetes mellitus, dyslipidemia, hypertension, post-menopausal and sedentary lifestyle. She is compliant with treatment all of the time. She is following a diabetic diet. She participates in exercise intermittently. Her breakfast blood glucose is taken between 8-9 am. Her breakfast blood glucose range is generally 110-130 mg/dl.  Hypertension This is a chronic problem. The current episode started more than 1 year ago. The problem is controlled. Pertinent negatives include no chest pain, neck pain, orthopnea or palpitations. Risk factors for coronary artery disease include diabetes mellitus.     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 06/15/2022   Chronic kidney disease    CKD stage 3   Chronic kidney disease, stage 3 (moderate) 05/20/2017   Diabetes mellitus without complication (Tammy Preston)    Endometrial hyperplasia 05/20/2017   Glomerular disorders in diseases classified elsewhere 05/20/2017   Hyperlipemia    Hypertension    Hypertensive chronic kidney disease with stage 1 through stage 4 chronic kidney disease, or unspecified chronic kidney disease 05/20/2017   Numbness and tingling of both feet    Type 2 diabetes mellitus with diabetic chronic kidney disease (Tammy Preston) 05/20/2017     Family History  Problem Relation Age of Onset   Stroke Mother    Congestive Heart Failure Father      Current Outpatient Medications:    allopurinol (ZYLOPRIM) 100 MG tablet, TAKE 2 TABLETS DAILY, Disp: 180 tablet, Rfl: 3   amLODipine (NORVASC) 2.5 MG tablet, Take 1 tablet (2.5 mg total) by mouth at bedtime., Disp: 90 tablet, Rfl: 2   aspirin EC 81 MG tablet, Take 81 mg by mouth daily after breakfast. , Disp: , Rfl:    Calcium Carb-Cholecalciferol (CALCIUM + D3 PO), Take 1 tablet by mouth daily. , Disp: , Rfl:    Cholecalciferol (VITAMIN D) 2000 units tablet, Take 2,000 Units by mouth daily., Disp: , Rfl:    FREESTYLE LITE test strip, Use as instructed to check blood sugars 2 times per day dx: e11.22, Disp: 300 each, Rfl: 2   gabapentin (NEURONTIN) 100 MG capsule, TAKE 1 CAPSULE BY MOUTH AT BEDTIME, Disp: 90 capsule, Rfl: 0  JARDIANCE 10 MG TABS tablet, TAKE 1 TABLET DAILY BEFORE BREAKFAST, Disp: 90 tablet, Rfl: 3   Lancets (FREESTYLE) lancets, Use as instructed to check blood sugars 2 times per day dx: e11.22, Disp: 300 each, Rfl: 2   losartan (COZAAR) 100 MG tablet, TAKE 1 TABLET DAILY, Disp: 90 tablet, Rfl: 3   metFORMIN (GLUCOPHAGE) 500 MG tablet, TAKE 1 TABLET DAILY AFTER BREAKFAST, Disp: 90 tablet, Rfl: 3   Multiple Vitamins-Minerals (ONE-A-DAY WOMENS 50 PLUS PO), Take 1 tablet by mouth daily., Disp: , Rfl:     RESTASIS 0.05 % ophthalmic emulsion, 2 times per day, Disp: , Rfl:    rosuvastatin (CRESTOR) 20 MG tablet, Take 1 tablet (20 mg total) by mouth daily., Disp: 90 tablet, Rfl: 3   Blood Glucose Monitoring Suppl (FREESTYLE LITE) w/Device KIT, Use to check blood sugars., Disp: 1 kit, Rfl: 1   Allergies  Allergen Reactions   Percocet [Oxycodone-Acetaminophen] Nausea And Vomiting   Shellfish Allergy Other (See Comments)    Causes Gout     Review of Systems  Constitutional: Negative.  Negative for fatigue.  Respiratory: Negative.    Cardiovascular: Negative.  Negative for chest pain, palpitations and orthopnea.  Endocrine: Negative for polydipsia, polyphagia and polyuria.  Musculoskeletal:  Negative for neck pain.  Neurological: Negative.  Negative for dizziness.  Psychiatric/Behavioral: Negative.       Today's Vitals   06/15/22 1051  BP: 130/70  Pulse: 84  Temp: 98.2 F (36.8 C)  SpO2: 98%  Weight: 202 lb 3.2 oz (91.7 kg)  Height: 5' 6"  (1.676 m)  PainSc: 0-No pain   Body mass index is 32.64 kg/m.   Wt Readings from Last 3 Encounters:  06/15/22 202 lb 3.2 oz (91.7 kg)  06/06/22 204 lb (92.5 kg)  05/30/22 204 lb (92.5 kg)     Objective:  Physical Exam Vitals and nursing note reviewed.  Constitutional:      Appearance: Normal appearance. She is obese.  HENT:     Head: Normocephalic and atraumatic.  Eyes:     Extraocular Movements: Extraocular movements intact.  Cardiovascular:     Rate and Rhythm: Normal rate and regular rhythm.     Heart sounds: Normal heart sounds.  Pulmonary:     Effort: Pulmonary effort is normal.     Breath sounds: Normal breath sounds.  Musculoskeletal:     Cervical back: Normal range of motion.  Skin:    General: Skin is warm.  Neurological:     General: No focal deficit present.     Mental Status: She is alert.  Psychiatric:        Mood and Affect: Mood normal.        Behavior: Behavior normal.       Assessment And Plan:     1.  Type 2 diabetes mellitus with stage 3a chronic kidney disease, without long-term current use of insulin (HCC) Comments: Chronic, I will check renal function and a1c today. She is scheduled to return in Nov 2023 for her next AWV.  - POCT Urinalysis Dipstick (81002) - Microalbumin / creatinine urine ratio - CMP14+EGFR - Hemoglobin A1c - CBC no Diff  2. Hypertensive heart and renal disease with renal failure, stage 1 through stage 4 or unspecified chronic kidney disease, without heart failure Comments: Chronic, controlled. She will c/w amlodipine 2.31m and losartan 1039mdaily. She is encouraged to follow a low sodium diet.  - POCT Urinalysis Dipstick (81002) - Microalbumin / creatinine urine ratio - CMP14+EGFR  3.  Atherosclerosis of native coronary artery of native heart without angina pectoris Comments: Chronic, LDL goal is less than70. She will c/w both ASA 81m and rosuvastatin 265mdaily.  She is currently asymptomatic.   4. Aortic atherosclerosis (HC[place]ellingtonComments: Chronic, LDL goal is less than70. She will c/w both ASA 8158mnd rosuvastatin 46m38mily.   5. 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 I will check uric acid level today.  - Uric acid  6. Class 1 obesity due to excess calories with serious comorbidity and body mass index (BMI) of 32.0 to 32.9 in adult Comments: She was congratulated on her 3lb weight loss since May 2023. She has enjoyed the PREP [employer]ab[employer] EnergyPatient was given opportunity to ask questions. Patient verbalized understanding of the plan and was able to repeat key elements of the plan. All questions were answered to their satisfaction.   I, RobynMaximino Preston have reviewed all documentation for this visit. The documentation on 06/15/22 for the exam, diagnosis, procedures, and orders are all accurate and complete.   IF YOU HAVE BEEN REFERRED TO A  SPECIALIST, IT MAY TAKE 1-2 WEEKS TO SCHEDULE/PROCESS THE REFERRAL. IF YOU HAVE NOT HEARD FROM US/SPECIALIST IN TWO WEEKS, PLEASE GIVE US A [place] AT [phone].   THE PATIENT IS ENCOURAGED TO PRACTICE SOCIAL DISTANCING DUE TO THE COVID-19 PANDEMIC.

## 2022-06-15 NOTE — Patient Instructions (Signed)

## 2022-06-16 LAB — CBC
Hematocrit: 44.3 % (ref 34.0–46.6)
Hemoglobin: 14.7 g/dL (ref 11.1–15.9)
MCH: 28.3 pg (ref 26.6–33.0)
MCHC: 33.2 g/dL (ref 31.5–35.7)
MCV: 85 fL (ref 79–97)
Platelets: 277 10*3/uL (ref 150–450)
RBC: 5.2 x10E6/uL (ref 3.77–5.28)
RDW: 13.3 % (ref 11.7–15.4)
WBC: 11.8 10*3/uL — ABNORMAL HIGH (ref 3.4–10.8)

## 2022-06-16 LAB — CMP14+EGFR
ALT: 28 IU/L (ref 0–32)
AST: 25 IU/L (ref 0–40)
Albumin/Globulin Ratio: 1.4 (ref 1.2–2.2)
Albumin: 4.3 g/dL (ref 3.8–4.8)
Alkaline Phosphatase: 104 IU/L (ref 44–121)
BUN/Creatinine Ratio: 15 (ref 12–28)
BUN: 19 mg/dL (ref 8–27)
Bilirubin Total: 0.4 mg/dL (ref 0.0–1.2)
CO2: 23 mmol/L (ref 20–29)
Calcium: 9.8 mg/dL (ref 8.7–10.3)
Chloride: 102 mmol/L (ref 96–106)
Creatinine, Ser: 1.3 mg/dL — ABNORMAL HIGH (ref 0.57–1.00)
Globulin, Total: 3 g/dL (ref 1.5–4.5)
Glucose: 107 mg/dL — ABNORMAL HIGH (ref 70–99)
Potassium: 4.8 mmol/L (ref 3.5–5.2)
Sodium: 140 mmol/L (ref 134–144)
Total Protein: 7.3 g/dL (ref 6.0–8.5)
eGFR: 43 mL/min/{1.73_m2} — ABNORMAL LOW (ref >59)

## 2022-06-16 LAB — URIC ACID: Uric Acid: 3.5 mg/dL (ref 3.1–7.9)

## 2022-06-16 LAB — HEMOGLOBIN A1C
Est. average glucose Bld gHb Est-mCnc: 180 mg/dL
Hgb A1c MFr Bld: 7.9 % — ABNORMAL HIGH (ref 4.8–5.6)

## 2022-06-17 ENCOUNTER — Ambulatory Visit: Payer: Medicare Other | Admitting: Podiatry

## 2022-06-20 ENCOUNTER — Emergency Department (HOSPITAL_BASED_OUTPATIENT_CLINIC_OR_DEPARTMENT_OTHER)
Admission: EM | Admit: 2022-06-20 | Discharge: 2022-06-20 | Disposition: A | Discharge status: Home / Self Care | Payer: Medicare Other | Attending: Emergency Medicine | Admitting: Emergency Medicine

## 2022-06-20 ENCOUNTER — Encounter (HOSPITAL_BASED_OUTPATIENT_CLINIC_OR_DEPARTMENT_OTHER): Payer: Self-pay | Admitting: Emergency Medicine

## 2022-06-20 ENCOUNTER — Emergency Department (HOSPITAL_BASED_OUTPATIENT_CLINIC_OR_DEPARTMENT_OTHER): Payer: Medicare Other | Admitting: Radiology

## 2022-06-20 ENCOUNTER — Other Ambulatory Visit: Payer: Self-pay

## 2022-06-20 DIAGNOSIS — U071 COVID-19: Secondary | ICD-10-CM | POA: Diagnosis not present

## 2022-06-20 DIAGNOSIS — Z7984 Long term (current) use of oral hypoglycemic drugs: Secondary | ICD-10-CM | POA: Diagnosis not present

## 2022-06-20 DIAGNOSIS — N183 Chronic kidney disease, stage 3 unspecified: Secondary | ICD-10-CM | POA: Diagnosis not present

## 2022-06-20 DIAGNOSIS — Z79899 Other long term (current) drug therapy: Secondary | ICD-10-CM | POA: Diagnosis not present

## 2022-06-20 DIAGNOSIS — Z7982 Long term (current) use of aspirin: Secondary | ICD-10-CM | POA: Insufficient documentation

## 2022-06-20 DIAGNOSIS — E1122 Type 2 diabetes mellitus with diabetic chronic kidney disease: Secondary | ICD-10-CM | POA: Diagnosis not present

## 2022-06-20 DIAGNOSIS — J3489 Other specified disorders of nose and nasal sinuses: Secondary | ICD-10-CM | POA: Insufficient documentation

## 2022-06-20 DIAGNOSIS — R059 Cough, unspecified: Secondary | ICD-10-CM | POA: Diagnosis not present

## 2022-06-20 DIAGNOSIS — I129 Hypertensive chronic kidney disease with stage 1 through stage 4 chronic kidney disease, or unspecified chronic kidney disease: Secondary | ICD-10-CM | POA: Diagnosis not present

## 2022-06-20 LAB — BASIC METABOLIC PANEL
Anion gap: 10 (ref 5–15)
BUN: 18 mg/dL (ref 8–23)
CO2: 28 mmol/L (ref 22–32)
Calcium: 9.7 mg/dL (ref 8.9–10.3)
Chloride: 101 mmol/L (ref 98–111)
Creatinine, Ser: 1.38 mg/dL — ABNORMAL HIGH (ref 0.44–1.00)
GFR, Estimated: 40 mL/min — ABNORMAL LOW (ref >60)
Glucose, Bld: 145 mg/dL — ABNORMAL HIGH (ref 70–99)
Potassium: 3.8 mmol/L (ref 3.5–5.1)
Sodium: 139 mmol/L (ref 135–145)

## 2022-06-20 LAB — CBC
HCT: 46.1 % — ABNORMAL HIGH (ref 36.0–46.0)
Hemoglobin: 15.2 g/dL — ABNORMAL HIGH (ref 12.0–15.0)
MCH: 28.7 pg (ref 26.0–34.0)
MCHC: 33 g/dL (ref 30.0–36.0)
MCV: 87.1 fL (ref 80.0–100.0)
Platelets: 237 10*3/uL (ref 150–400)
RBC: 5.29 MIL/uL — ABNORMAL HIGH (ref 3.87–5.11)
RDW: 13.7 % (ref 11.5–15.5)
WBC: 9.8 10*3/uL (ref 4.0–10.5)
nRBC: 0 % (ref 0.0–0.2)

## 2022-06-20 LAB — SARS CORONAVIRUS 2 BY RT PCR: SARS Coronavirus 2 by RT PCR: POSITIVE — AB

## 2022-06-20 MED ORDER — NIRMATRELVIR/RITONAVIR (PAXLOVID) TABLET (RENAL DOSING): 2.0000 | ORAL_TABLET | Freq: Two times a day (BID) | ORAL | 0 refills | Status: AC

## 2022-06-20 NOTE — ED Triage Notes (Signed)
On Thursday, had scratchy throat, then next day sneezing, cough,runny eyes, no fever. Today tested positive for COVID, family has COVID, the patient wanting to make sure her lungs are ok and to repeat test.

## 2022-06-20 NOTE — ED Provider Notes (Signed)
[facility] EMERGENCY DEPT Provider Note   CSN: [id number] Arrival date & time: 06/20/22  1321     History  Chief Complaint  Patient presents with   Cough    Tammy Preston is a 76 y.o. female. With past medical history of CKD III, T2DM, HTN, HLD who presents to emergency department with cough and positive home COVID test.   States symptoms began on Thursday night.  She describes having rhinorrhea, congestion and cough.  Cough is nonproductive.  She states that her sister and niece tested positive for COVID on Friday and she took a test today and was positive.  She denies having any fevers, body aches, nausea, vomiting or diarrhea.  She denies feeling short of breath or having chest pain.  HPI     Home Medications Prior to Admission medications   Medication Sig Start Date End Date Taking? Authorizing Provider  nirmatrelvir/ritonavir EUA, renal dosing, (PAXLOVID) 10 x 150 MG & 10 x 100MG TABS Take 2 tablets by mouth 2 (two) times daily for 5 days. Patient GFR is 40. Take nirmatrelvir (150 mg) one tablet twice daily for 5 days and ritonavir (100 mg) one tablet twice daily for 5 days. 06/20/22 06/25/22 Yes Mickie Hillier, PA-C  allopurinol (ZYLOPRIM) 100 MG tablet TAKE 2 TABLETS DAILY 01/10/22   Glendale Chard, MD  amLODipine (NORVASC) 2.5 MG tablet Take 1 tablet (2.5 mg total) by mouth at bedtime. 07/28/21 07/28/22  Glendale Chard, MD  aspirin EC 81 MG tablet Take 81 mg by mouth daily after breakfast.     [provider]  Blood Glucose Monitoring Suppl (FREESTYLE LITE) w/Device KIT Use to check blood sugars. 06/15/22   Glendale Chard, MD  Calcium Carb-Cholecalciferol (CALCIUM + D3 PO) Take 1 tablet by mouth daily.     [provider]  Cholecalciferol (VITAMIN D) 2000 units tablet Take 2,000 Units by mouth daily.    [provider]  FREESTYLE LITE test strip Use as instructed to check blood sugars 2 times per day dx: e11.22 07/28/21   Glendale Chard,  MD  gabapentin (NEURONTIN) 100 MG capsule TAKE 1 CAPSULE BY MOUTH AT BEDTIME 07/02/21   Trula Slade, DPM  JARDIANCE 10 MG TABS tablet TAKE 1 TABLET DAILY BEFORE BREAKFAST 06/07/22   Glendale Chard, MD  Lancets (FREESTYLE) lancets Use as instructed to check blood sugars 2 times per day dx: e11.22 07/28/21   Glendale Chard, MD  losartan (COZAAR) 100 MG tablet TAKE 1 TABLET DAILY 05/19/22   Glendale Chard, MD  metFORMIN (GLUCOPHAGE) 500 MG tablet TAKE 1 TABLET DAILY AFTER BREAKFAST 02/23/22   Glendale Chard, MD  Multiple Vitamins-Minerals (ONE-A-DAY WOMENS 50 PLUS PO) Take 1 tablet by mouth daily.    [provider]  RESTASIS 0.05 % ophthalmic emulsion 2 times per day 03/27/19   [provider]  rosuvastatin (CRESTOR) 20 MG tablet Take 1 tablet (20 mg total) by mouth daily. 08/16/21   Elouise Munroe, MD      Allergies    Percocet [oxycodone-acetaminophen] and Shellfish allergy    Review of Systems   Review of Systems  Constitutional:  Negative for appetite change, chills, diaphoresis and fever.  Respiratory:  Positive for cough. Negative for shortness of breath.   Cardiovascular:  Negative for chest pain.  Gastrointestinal:  Negative for nausea and vomiting.  All other systems reviewed and are negative.   Physical Exam Updated Vital Signs BP 123/89 (BP Location: Right Arm)   Pulse 90  Temp 97.8 F (36.6 C) (Oral)   Resp 16   SpO2 98%  Physical Exam Vitals and nursing note reviewed.  Constitutional:      General: She is not in acute distress.    Appearance: Normal appearance. She is normal weight. She is ill-appearing. She is not toxic-appearing.  HENT:     Head: Normocephalic and atraumatic.     Nose: Congestion and rhinorrhea present.     Mouth/Throat:     Mouth: Mucous membranes are moist.     Pharynx: Oropharynx is clear.  Eyes:     General: No scleral icterus.    Extraocular Movements: Extraocular movements intact.     Pupils: Pupils are equal,  round, and reactive to light.  Cardiovascular:     Rate and Rhythm: Normal rate and regular rhythm.     Pulses: Normal pulses.     Heart sounds: No murmur heard. Pulmonary:     Effort: Pulmonary effort is normal. No respiratory distress.     Breath sounds: Normal breath sounds. No wheezing, rhonchi or rales.  Abdominal:     General: Bowel sounds are normal.     Palpations: Abdomen is soft.  Musculoskeletal:     Cervical back: Neck supple.  Skin:    General: Skin is warm and dry.     Capillary Refill: Capillary refill takes less than 2 seconds.  Neurological:     General: No focal deficit present.     Mental Status: She is alert and oriented to person, place, and time. Mental status is at baseline.  Psychiatric:        Mood and Affect: Mood normal.        Behavior: Behavior normal.        Thought Content: Thought content normal.        Judgment: Judgment normal.    ED Results / Procedures / Treatments   Labs (all labs ordered are listed, but only abnormal results are displayed) Labs Reviewed  SARS CORONAVIRUS 2 BY RT PCR - Abnormal; Notable for the following components:      Result Value   SARS Coronavirus 2 by RT PCR POSITIVE (*)    All other components within normal limits  CBC - Abnormal; Notable for the following components:   RBC 5.29 (*)    Hemoglobin 15.2 (*)    HCT 46.1 (*)    All other components within normal limits  BASIC METABOLIC PANEL - Abnormal; Notable for the following components:   Glucose, Bld 145 (*)    Creatinine, Ser 1.38 (*)    GFR, Estimated 40 (*)    All other components within normal limits   EKG None  Radiology DG Chest 2 View  Result Date: 06/20/2022 CLINICAL DATA:  Cough.  Positive home COVID test. EXAM: CHEST - 2 VIEW COMPARISON:  CT chest 12/14/2021 FINDINGS: Cardiac silhouette and mediastinal contours within normal limits. There is a large calcified granuloma measuring up to approximately 13 mm at the posteromedial aspect of the right  lower lobe as on prior CT. This is benign. No focal airspace opacity. No pleural effusion or pneumothorax. Mild to moderate multilevel degenerative disc changes of the thoracic spine. Left axillary surgical clips are again seen. IMPRESSION: No acute lung process. Electronically Signed   By: Yvonne Kendall M.D.   On: 06/20/2022 14:37    Procedures Procedures   Medications Ordered in ED Medications - No data to display  ED Course/ Medical Decision Making/ A&P  Medical Decision Making Amount and/or Complexity of Data Reviewed Labs: ordered. Radiology: ordered.  This patient presents to the ED with chief complaint(s) of cough with pertinent past medical history of T2DM, CKD III, HTN which further complicates the presenting complaint. The complaint involves an extensive differential diagnosis and also carries with it a high risk of complications and morbidity.    The differential diagnosis includes pneumonia, viral URI, asthma, COPD exacerbation, CHF, medication side effect, postnasal drip, allergies, PE, etc.   Additional history obtained: Additional history obtained from  none available Records reviewed Care Everywhere/External Records  ED Course and Reassessment: 76 year old female who presents to the emergency department with cough and home positive COVID test. Family also has COVID. Wanted to "check on her lungs." COVID+ here XR without pneumonia or other acute findings. Her lungs are CTAB.  She is ill appearing but non-toxic in appearance. No respiratory distress, hypoxia, adventitious lungs sounds. Hemodynamically stable. Does not appear septic. Given prescription for renal dosing Paxlovid.  Given return precautions for worsening symptoms such as respiratory distress, difficulty breathing. She verbalizes understanding.   Safe for discharge   Independent labs interpretation:  The following labs were independently interpreted: COVID+  Independent  visualization of imaging: - I independently visualized the following imaging with scope of interpretation limited to determining acute life threatening conditions related to emergency care: CXR, which revealed no acute pneumonia, pneumothorax, or pleural effusion  Consultation: - Consulted or discussed management/test interpretation w/ external professional: not indicated  Consideration for admission or further workup: not indicated  Social Determinants of health: none identified  Final Clinical Impression(s) / ED Diagnoses Final diagnoses:  COVID-19    Rx / DC Orders ED Discharge Orders          Ordered    nirmatrelvir/ritonavir EUA, renal dosing, (PAXLOVID) 10 x 150 MG & 10 x 100MG TABS  2 times daily        06/20/22 1720              Mickie Hillier, PA-C 06/20/22 1720    Elgie Congo, MD 06/21/22 [phone]

## 2022-06-20 NOTE — Discharge Instructions (Signed)
You were seen in the emergency department today for cough and runny nose.  You have COVID-19.  You do not have pneumonia.  I have prescribed you Paxlovid which she will take over the next 5 days.  Please return if you begin to have worsening shortness of breath or difficulty breathing.

## 2022-06-20 NOTE — ED Notes (Signed)
Discharge paperwork given and verbally understood. 

## 2022-06-23 ENCOUNTER — Telehealth: Payer: Self-pay

## 2022-06-23 NOTE — Telephone Encounter (Signed)
Transition Care Management Unsuccessful Follow-up Telephone Call  Date of discharge and from where:  06/20/[facility]   Attempts:  1st Attempt  Reason for unsuccessful TCM follow-up call:  Left voice message

## 2022-06-24 ENCOUNTER — Telehealth: Payer: Self-pay

## 2022-06-24 NOTE — Telephone Encounter (Signed)
Transition Care Management Follow-up Telephone Call Date of discharge and from where: 06/20/2022 [facility]  How have you been since you were released from the hospital? Pt reports she feels a lot better, getting plenty of rest and taking medications as directed. She does not need an appointment at this moment. Patient aware, if anything changes to give the office a call.  Any questions or concerns? No  Items Reviewed: Did the pt receive and understand the discharge instructions provided? Yes  Medications obtained and verified? Yes  Other? Yes  Any new allergies since your discharge? No  Dietary orders reviewed? Yes Do you have support at home? Yes   Home Care and Equipment/Supplies: Were home health services ordered? no If so, what is the name of the agency? N/a  Has the agency set up a time to come to the patient's home? no Were any new equipment or medical supplies ordered?  No What is the name of the medical supply agency? N/a Were you able to get the supplies/equipment? no Do you have any questions related to the use of the equipment or supplies? No  Functional Questionnaire: (I = Independent and D = Dependent) ADLs: i  Bathing/Dressing- i  Meal Prep- i  Eating- i  Maintaining continence- i  Transferring/Ambulation- i  Managing Meds- i  Follow up appointments reviewed:  PCP Hospital f/u appt confirmed? No  Scheduled to see n/a on n/a @ n/a. [facility] f/u appt confirmed? No  Scheduled to see n/a on n/a @ n/a. Are transportation arrangements needed? No  If their condition worsens, is the pt aware to call PCP or go to the Emergency Dept.? Yes Was the patient provided with contact information for the PCP's office or ED? Yes Was to pt encouraged to call back with questions or concerns? Yes

## 2022-06-29 ENCOUNTER — Other Ambulatory Visit: Payer: Self-pay | Admitting: Internal Medicine

## 2022-06-30 NOTE — Progress Notes (Signed)
[facility] PREP Evaluation  Patient Details  Name: Tammy Preston MRN: [id number] Date of Birth: Feb 28, 1946 Age: 76 y.o. PCP: Glendale Chard, MD  Vitals:   06/30/22 1323  BP: 122/64  Pulse: (!) 103  SpO2: 99%  Weight: 201 lb 3.2 oz (91.3 kg)     [facility] Eval - 06/30/22 1300       [facility] "PREP" Location   [facility] "PREP" Location [facility]      Referral    Program Start Date --   final class date 06/20/22     Measurement   Waist Circumference 42 inches    Hip Circumference 48 inches    Body fat 44.7 percent      Information for Trainer   Goals Plans to cont to exercise      Mobility and Daily Activities   I find it easy to walk up or down two or more flights of stairs. 2    I have no trouble taking out the trash. 4    I do housework such as vacuuming and dusting on my own without difficulty. 4    I can easily lift a gallon of milk (8lbs). 4    I can easily walk a mile. 4    I have no trouble reaching into high cupboards or reaching down to pick up something from the floor. 4    I do not have trouble doing out-door work such as Armed forces logistics/support/administrative officer, raking leaves, or gardening. 4      Mobility and Daily Activities   I feel younger than my age. 4    I feel independent. 4    I feel energetic. 4    I live an active life.  4    I feel strong. 4    I feel healthy. 2    I feel active as other people my age. 4      How fit and strong are you.   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 06/15/2022   Chronic kidney disease    CKD stage 3   Chronic kidney disease, stage 3 (moderate) 05/20/2017   Diabetes mellitus without complication ([place])    Endometrial hyperplasia 05/20/2017   Glomerular disorders in diseases classified elsewhere 05/20/2017   Hyperlipemia    Hypertension    Hypertensive chronic kidney disease with stage 1 through  stage 4 chronic kidney disease, or unspecified chronic kidney disease 05/20/2017   Numbness and tingling of both feet    Type 2 diabetes mellitus with diabetic chronic kidney disease ([place]) 05/20/2017   Past Surgical History:  Procedure Laterality Date   BACK SURGERY     BREAST LUMPECTOMY Left    COLONOSCOPY     DILATATION & CURETTAGE/HYSTEROSCOPY WITH MYOSURE N/A 04/07/2017   Procedure: DILATATION & CURETTAGE/HYSTEROSCOPY WITH MYOSURE;  Surgeon: Servando Salina, MD;  Location: [facility];  Service: Gynecology;  Laterality: N/A;   KNEE SURGERY     Social History   Tobacco Use  Smoking Status Every Day   Packs/day: 0.25   Years: 54.00   Total pack years: 13.50   Types: Cigarettes   Start date: 10/24/1965  Smokeless Tobacco Never  Tobacco Comments   11/30/21-She has cut back to 3 cigs/day. Plans to quit by 4 months.  03/03/22-She states she is down to 1 cig/day, still plans to quit w/in 2 months   Plans to continue to exercise either at [facility] or at home or a gym near her home if she can't cont membership at [employer] Attended >20 workouts, 11 of 12 educational sessions Cardio march test: 97 to 230 Sit to stand: 10 to 15 Bicep curl: 8 to 14 Needs continued balance exercises to improve standing on one foot.  Ankles are no longer swollen and knee pain is better Only uses can now to walk long distances or up hill  [employer] 06/30/2022, 1:26 PM

## 2022-07-13 ENCOUNTER — Telehealth: Payer: Self-pay | Admitting: Internal Medicine

## 2022-07-13 MED ORDER — ROSUVASTATIN CALCIUM 20 MG PO TABS: 20.0000 mg | ORAL_TABLET | Freq: Every day | ORAL | 1 refills | Status: DC

## 2022-07-13 NOTE — Telephone Encounter (Signed)
  *  STAT* If patient is at the pharmacy, call can be transferred to refill team.   1. Which medications need to be refilled? (please list name of each medication and dose if known) rosuvastatin (CRESTOR) 20 MG tablet  2. Which pharmacy/location (including street and city if local pharmacy) is medication to be sent to?[facility] [place], [place]  3. Do they need a 30 day or 90 day supply? 90 days

## 2022-07-13 NOTE — Telephone Encounter (Signed)
Refill sent to patient's pharmacy of choice which is [facility]

## 2022-07-26 ENCOUNTER — Ambulatory Visit (INDEPENDENT_AMBULATORY_CARE_PROVIDER_SITE_OTHER): Payer: Medicare Other | Admitting: Podiatry

## 2022-07-26 DIAGNOSIS — B351 Tinea unguium: Secondary | ICD-10-CM

## 2022-07-26 DIAGNOSIS — Q828 Other specified congenital malformations of skin: Secondary | ICD-10-CM

## 2022-07-26 DIAGNOSIS — M79675 Pain in left toe(s): Secondary | ICD-10-CM

## 2022-07-26 DIAGNOSIS — E1149 Type 2 diabetes mellitus with other diabetic neurological complication: Secondary | ICD-10-CM | POA: Diagnosis not present

## 2022-07-26 DIAGNOSIS — M79674 Pain in right toe(s): Secondary | ICD-10-CM | POA: Diagnosis not present

## 2022-08-02 ENCOUNTER — Ambulatory Visit (INDEPENDENT_AMBULATORY_CARE_PROVIDER_SITE_OTHER): Payer: Medicare Other

## 2022-08-02 ENCOUNTER — Other Ambulatory Visit: Payer: Self-pay

## 2022-08-02 VITALS — BP 130/78 | HR 100 | Temp 98.4°F

## 2022-08-02 DIAGNOSIS — Z23 Encounter for immunization: Secondary | ICD-10-CM | POA: Diagnosis not present

## 2022-08-02 MED ORDER — AMLODIPINE BESYLATE 2.5 MG PO TABS: 2.5000 mg | ORAL_TABLET | Freq: Every day | ORAL | 2 refills | Status: DC

## 2022-08-02 NOTE — Progress Notes (Signed)
Patient presents today for flu vaccine.  

## 2022-08-02 NOTE — Progress Notes (Signed)
Subjective: Chief Complaint  Patient presents with   Diabetes    Diabetic foot care, A1c- 7.7 BG-130 Nail trim     76 y.o. returns the office today for painful, elongated, thickened toenails which she cannot trim herself. Denies any redness or drainage around the nails.  She also gets callus on the ball of her right foot which cause discomfort.  She does not report any open sores.  Last A1c was 8.2 and last glucose was 140.  PCP: Glendale Chard, MD Last see: 03/03/2022 Last A1c was 7.7 on 03/03/2022  Objective: AAO 3, NAD DP/PT pulses palpable, CRT less than 3 seconds Nails hypertrophic, dystrophic, elongated, brittle, discolored 10. There is tenderness overlying the nails 1-5 bilaterally. There is no surrounding erythema or drainage along the nail sites. Hperkeratotic tissue right submetatarsal 5 and left sub metatarsal 2.  No underlying ulceration drainage or signs of infection.  No other open lesions or preulcerative lesions. No open lesions or pre-ulcerative lesions are identified. No pain with calf compression, swelling, warmth, erythema.  Assessment: Patient presents with symptomatic onychomycosis, hyperkeratotic lesions, neuropathy  Plan: -Treatment options including alternatives, risks, complications were discussed -Nails sharply debrided 10 without complication/bleeding. -Hyperkeratotic lesion sharply debrided x2 without any complications or bleeding. Moisturizer/offloading daily -Continue gabapentin.  -Continue daily foot inspection, glucose control. -Follow-up in 3 months or sooner if any problems are to arise. In the meantime, encouraged to call the office with any questions, concerns, changes symptoms.  Celesta Gentile, DPM

## 2022-08-03 ENCOUNTER — Other Ambulatory Visit: Payer: Self-pay

## 2022-08-03 MED ORDER — AMLODIPINE BESYLATE 2.5 MG PO TABS: 2.5000 mg | ORAL_TABLET | Freq: Every day | ORAL | 2 refills | Status: DC

## 2022-08-25 ENCOUNTER — Ambulatory Visit (INDEPENDENT_AMBULATORY_CARE_PROVIDER_SITE_OTHER): Payer: Medicare Other

## 2022-08-25 ENCOUNTER — Encounter: Payer: Self-pay | Admitting: Internal Medicine

## 2022-08-25 ENCOUNTER — Ambulatory Visit (INDEPENDENT_AMBULATORY_CARE_PROVIDER_SITE_OTHER): Payer: Medicare Other | Admitting: Internal Medicine

## 2022-08-25 VITALS — BP 118/70 | HR 82 | Temp 98.1°F | Ht 65.4 in | Wt 198.6 lb

## 2022-08-25 VITALS — BP 118/70 | HR 82 | Temp 98.1°F | Ht 65.4 in | Wt 198.0 lb

## 2022-08-25 DIAGNOSIS — E6609 Other obesity due to excess calories: Secondary | ICD-10-CM

## 2022-08-25 DIAGNOSIS — Z6832 Body mass index (BMI) 32.0-32.9, adult: Secondary | ICD-10-CM

## 2022-08-25 DIAGNOSIS — E1122 Type 2 diabetes mellitus with diabetic chronic kidney disease: Secondary | ICD-10-CM

## 2022-08-25 DIAGNOSIS — I2510 Atherosclerotic heart disease of native coronary artery without angina pectoris: Secondary | ICD-10-CM | POA: Diagnosis not present

## 2022-08-25 DIAGNOSIS — N1831 Chronic kidney disease, stage 3a: Secondary | ICD-10-CM | POA: Diagnosis not present

## 2022-08-25 DIAGNOSIS — Z0000 Encounter for general adult medical examination without abnormal findings: Secondary | ICD-10-CM | POA: Diagnosis not present

## 2022-08-25 DIAGNOSIS — F17210 Nicotine dependence, cigarettes, uncomplicated: Secondary | ICD-10-CM

## 2022-08-25 DIAGNOSIS — M1A3790 Chronic gout due to renal impairment, unspecified ankle and foot, without tophus (tophi): Secondary | ICD-10-CM | POA: Diagnosis not present

## 2022-08-25 DIAGNOSIS — I1310 Hypertensive heart and chronic kidney disease without heart failure, with stage 1 through stage 4 chronic kidney disease, or unspecified chronic kidney disease: Secondary | ICD-10-CM

## 2022-08-25 NOTE — Patient Instructions (Signed)

## 2022-08-25 NOTE — Progress Notes (Signed)
Subjective:   NEFERTITI MOHAMAD is a 76 y.o. female who presents for Medicare Annual (Subsequent) preventive examination.  Review of Systems     Cardiac Risk Factors include: advanced age (>20mn, >>54women);diabetes mellitus;hypertension;obesity (BMI >30kg/m2);smoking/ tobacco exposure     Objective:    Today's Vitals   08/25/22 0941  BP: 118/70  Pulse: 82  Temp: 98.1 F (36.7 C)  TempSrc: Oral  SpO2: 99%  Weight: 198 lb 9.6 oz (90.1 kg)  Height: 5' 5.4" (1.661 m)   Body mass index is 32.65 kg/m.     08/25/2022    9:55 AM 06/20/2022    1:48 PM 07/28/2021   10:30 AM 07/22/2020   11:09 AM 07/18/2019    9:23 AM 05/07/2017   12:51 AM 03/27/2017   12:10 PM  Advanced Directives  Does Patient Have a Medical Advance Directive? _0  No No  Would patient like information on creating a medical advance directive? No - Patient declined No - Patient declined  No - Patient declined No - Patient declined  Yes (MAU/Ambulatory/Procedural Areas - Information given)    Current Medications (verified) Outpatient Encounter Medications as of 08/25/2022  Medication Sig   allopurinol (ZYLOPRIM) 100 MG tablet TAKE 2 TABLETS DAILY   amLODipine (NORVASC) 2.5 MG tablet Take 1 tablet (2.5 mg total) by mouth at bedtime.   aspirin EC 81 MG tablet Take 81 mg by mouth daily after breakfast.    Blood Glucose Monitoring Suppl (FREESTYLE LITE) w/Device KIT Use to check blood sugars.   Calcium Carb-Cholecalciferol (CALCIUM + D3 PO) Take 1 tablet by mouth daily.    Cholecalciferol (VITAMIN D) 2000 units tablet Take 2,000 Units by mouth daily.   FREESTYLE LITE test strip Use as instructed to check blood sugars 2 times per day dx: e11.22   gabapentin (NEURONTIN) 100 MG capsule TAKE 1 CAPSULE BY MOUTH AT BEDTIME   JARDIANCE 10 MG TABS tablet TAKE 1 TABLET DAILY BEFORE BREAKFAST   Lancets (FREESTYLE) lancets Use as instructed to check blood sugars 2 times per day dx: e11.22   losartan (COZAAR) 100 MG  tablet TAKE 1 TABLET DAILY   metFORMIN (GLUCOPHAGE) 500 MG tablet TAKE 1 TABLET DAILY AFTER BREAKFAST   Multiple Vitamins-Minerals (ONE-A-DAY WOMENS 50 PLUS PO) Take 1 tablet by mouth daily.   RESTASIS 0.05 % ophthalmic emulsion 2 times per day   rosuvastatin (CRESTOR) 20 MG tablet Take 1 tablet (20 mg total) by mouth daily.   No facility-administered encounter medications on file as of 08/25/2022.    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 05/20/2017   Hyperlipemia    Hypertension    Hypertensive chronic kidney disease with stage 1 through stage 4 chronic kidney disease, or unspecified chronic kidney disease 05/20/2017   Numbness and tingling of both feet    Type 2 diabetes mellitus with diabetic chronic kidney disease ([place] 05/20/2017   Past Surgical History:  Procedure Laterality Date   BACK SURGERY     BREAST LUMPECTOMY Left    COLONOSCOPY     DILATATION &  CURETTAGE/HYSTEROSCOPY WITH MYOSURE N/A 04/07/2017   Procedure: DILATATION & CURETTAGE/HYSTEROSCOPY WITH MYOSURE;  Surgeon: Servando Salina, MD;  Location: [facility];  Service: Gynecology;  Laterality: N/A;   KNEE SURGERY     Family History  Problem Relation Age of Onset   Stroke Mother    Congestive Heart Failure Father    Social History   Socioeconomic History   Marital status: Married    Spouse name: Not on file   Number of children: Not on file   Years of education: Not on file   Highest education level: Not on file  Occupational History   Occupation: retired  Tobacco Use    Smoking status: Every Day    Packs/day: 0.25    Years: 54.00    Total pack years: 13.50    Types: Cigarettes    Start date: 10/24/1965   Smokeless tobacco: Never   Tobacco comments:    11/30/21-She has cut back to 3 cigs/day. Plans to quit by 4 months.     03/03/22-She states she is down to 1 cig/day, still plans to quit w/in 2 months  Vaping Use   Vaping Use: Never used  Substance and Sexual Activity   Alcohol use: No    Alcohol/week: 0.0 standard drinks of alcohol   Drug use: No   Sexual activity: Not Currently  Other Topics Concern   Not on file  Social History Narrative   Not on file   Social Determinants of Health   Financial Resource Strain: Low Risk  (08/25/2022)   Overall Financial Resource Strain (CARDIA)    Difficulty of Paying Living Expenses: Not hard at all  Food Insecurity: No Food Insecurity (08/25/2022)   Hunger Vital Sign    Worried About Running Out of Food in the Last Year: Never true    Ran Out of Food in the Last Year: Never true  Transportation Needs: No Transportation Needs (08/25/2022)   PRAPARE - Hydrologist (Medical): No    Lack of Transportation (Non-Medical): No  Physical Activity: Inactive (08/25/2022)   Exercise Vital Sign    Days of Exercise per Week: 0 days    Minutes of Exercise per Session: 0 min  Stress: No Stress Concern Present (08/25/2022)   Cool    Feeling of Stress : Not at all  Social Connections: Not on file    Tobacco Counseling Ready to quit: Yes Counseling given: Not Answered Tobacco comments: 11/30/21-She has cut back to 3 cigs/day. Plans to quit by 4 months.  03/03/22-She states she is down to 1 cig/day, still plans to quit w/in 2 months   Clinical Intake:  Pre-visit preparation completed: Yes  Pain : No/denies pain     Nutritional Status: BMI > 30  Obese Nutritional Risks: None Diabetes: Yes  How often do you need to  have someone help you when you read instructions, pamphlets, or other written materials from your doctor or pharmacy?: 1 - Never  Diabetic? Yes Nutrition Risk Assessment:  Has the patient had any N/V/D within the last 2 months?  No  Does the patient have any non-healing wounds?  No  Has the patient had any unintentional weight loss or weight gain?  No   Diabetes:  Is the patient diabetic?  Yes  If diabetic, was a CBG obtained today?  No  Did the patient bring in their glucometer from home?  No  How often do you monitor your CBG's?  daily.   Financial Strains and Diabetes Management:  Are you having any financial strains with the device, your supplies or your medication? No .  Does the patient want to be seen by Chronic Care Management for management of their diabetes?  No  Would the patient like to be referred to a Nutritionist or for Diabetic Management?  No   Diabetic Exams:  Diabetic Eye Exam: Completed 09/09/2021 Diabetic Foot Exam: Completed 11/30/2021  Interpreter Needed?: No  Information entered by :: NAllen LPN   Activities of Daily Living    08/25/2022    9:56 AM  In your present state of health, do you have any difficulty performing the following activities:  Hearing? 0  Vision? 0  Difficulty concentrating or making decisions? 0  Walking or climbing stairs? 0  Dressing or bathing? 0  Doing errands, shopping? 0  Preparing Food and eating ? N  Using the Toilet? N  In the past six months, have you accidently leaked urine? N  Do you have problems with loss of bowel control? N  Managing your Medications? N  Managing your Finances? N  Housekeeping or managing your Housekeeping? N    Patient Care Team: Glendale Chard, MD as PCP - General (Internal Medicine) Marylynn Pearson, MD as Consulting Physician (Ophthalmology)  Indicate any recent Medical Services you may have received from other than [facility] providers in the past year (date may be approximate).      Assessment:   This is a routine wellness examination for [place].  Hearing/Vision screen Vision Screening - Comments:: Regular eye exams, Dr. Venetia Maxon  Dietary issues and exercise activities discussed: Current Exercise Habits: The patient does not participate in regular exercise at present   Goals Addressed             This Visit's Progress    Patient Stated       08/25/2022, no goals       Depression Screen    08/25/2022    9:56 AM 07/28/2021   10:31 AM 07/22/2020   11:10 AM 04/20/2020    4:10 PM 09/04/2019    2:17 PM 07/18/2019    9:24 AM 04/23/2019   11:20 AM  PHQ 2/9 Scores  PHQ - 2 Score 0 0 0 0 0 0 0  PHQ- 9 Score      0     Fall Risk    08/25/2022    9:56 AM 07/28/2021   10:31 AM 07/22/2020   11:10 AM 04/20/2020    4:09 PM 09/04/2019    2:17 PM  Fall Risk   Falls in the past year? 0 0 0 0 0  Number falls in past yr: 0   0   Injury with Fall? 0   0   Risk for fall due to : Medication side effect Medication side effect Medication side effect    Follow up Falls prevention discussed;Education provided;Falls evaluation completed Falls evaluation completed;Education provided;Falls prevention discussed Falls evaluation completed;Education provided;Falls prevention discussed      FALL RISK PREVENTION PERTAINING TO THE HOME:  Any stairs in or around the home? Yes  If so, are there any without handrails? No  Home free of loose throw rugs in walkways, pet beds, electrical cords, etc? Yes  Adequate lighting in your home to reduce risk of falls? Yes   ASSISTIVE DEVICES UTILIZED TO PREVENT FALLS:  Life alert? No  Use of a cane, walker or w/c? No  Grab bars in the bathroom? Yes  Shower chair  or bench in shower? Yes  Elevated toilet seat or a handicapped toilet? Yes   TIMED UP AND GO:  Was the test performed? Yes .  Length of time to ambulate 10 feet: 5 sec.   Gait steady and fast without use of assistive device  Cognitive Function:        08/25/2022    9:57  AM 07/28/2021   10:32 AM 07/22/2020   11:11 AM 07/18/2019    9:27 AM  6CIT Screen  What Year? 0 points 0 points 0 points 0 points  What month? 0 points 0 points 0 points 0 points  What time? 0 points 0 points 0 points 0 points  Count back from 20 0 points 0 points 0 points 0 points  Months in reverse 0 points 0 points 0 points 0 points  Repeat phrase 2 points 2 points 0 points 0 points  Total Score 2 points 2 points 0 points 0 points    Immunizations Immunization History  Administered Date(s) Administered   DTaP 01/22/2014   Fluad Quad(high Dose 65+) 07/22/2020, 08/02/2022   Influenza, High Dose Seasonal PF 07/06/2020, 07/06/2021   Influenza-Unspecified 07/24/2013, 07/11/2018, 07/18/2019   PFIZER Comirnaty(Gray Top)Covid-19 Tri-Sucrose Vaccine 03/22/2021   PFIZER(Purple Top)SARS-COV-2 Vaccination 01/04/2020, 01/28/2020, 08/12/2020, 03/22/2021, 07/20/2021   Pneumococcal Conjugate-13 07/13/2018   Pneumococcal Polysaccharide-23 07/18/2019   Pneumococcal-Unspecified 07/10/2017   Zoster Recombinat (Shingrix) 12/22/2017, 01/27/2018, 04/05/2018    TDAP status: Up to date  Flu Vaccine status: Up to date  Pneumococcal vaccine status: Up to date  Covid-19 vaccine status: Completed vaccines  Qualifies for Shingles Vaccine? Yes   Zostavax completed Yes   Shingrix Completed?: Yes  Screening Tests Health Maintenance  Topic Date Due   COVID-19 Vaccine (7 - Pfizer risk series) 09/14/2021   Diabetic kidney evaluation - Urine ACR  07/28/2022   Medicare Annual Wellness (AWV)  07/28/2022   OPHTHALMOLOGY EXAM  09/09/2022   FOOT EXAM  11/30/2022   HEMOGLOBIN A1C  12/16/2022   MAMMOGRAM  06/11/2023   Diabetic kidney evaluation - GFR measurement  06/21/2023   TETANUS/TDAP  01/23/2024   COLONOSCOPY (Pts 45-70yr Insurance coverage will need to be confirmed)  07/15/2031   Pneumonia Vaccine 75 Years old  Completed   INFLUENZA VACCINE  Completed   DEXA SCAN  Completed   Hepatitis C  Screening  Completed   Zoster Vaccines- Shingrix  Completed   HPV VACCINES  Aged Out    Health Maintenance  Health Maintenance Due  Topic Date Due   COVID-19 Vaccine (7 - Pfizer risk series) 09/14/2021   Diabetic kidney evaluation - Urine ACR  07/28/2022   Medicare Annual Wellness (AWV)  07/28/2022    Colorectal cancer screening: No longer required.   Mammogram status: Completed 06/10/2022. Repeat every year  Bone Density status: Completed 12/08/2021.  Lung Cancer Screening: (Low Dose CT Chest recommended if Age 75-80years, 30 pack-year currently smoking OR have quit w/in 15years.) does not qualify.   Lung Cancer Screening Referral: no  Additional Screening:  Hepatitis C Screening: does qualify; Completed 03/14/2018  Vision Screening: Recommended annual ophthalmology exams for early detection of glaucoma and other disorders of the eye. Is the patient up to date with their annual eye exam?  Yes  Who is the provider or what is the name of the office in which the patient attends annual eye exams? Dr. WVenetia MaxonIf pt is not established with a provider, would they like to be referred to a provider to establish care? No .  Dental Screening: Recommended annual dental exams for proper oral hygiene  Community Resource Referral / Chronic Care Management: CRR required this visit?  No   CCM required this visit?  No      Plan:     I have personally reviewed and noted the following in the patient's chart:   Medical and social history Use of alcohol, tobacco or illicit drugs  Current medications and supplements including opioid prescriptions. Patient is not currently taking opioid prescriptions. Functional ability and status Nutritional status Physical activity Advanced directives List of other physicians Hospitalizations, surgeries, and ER visits in previous 12 months Vitals Screenings to include cognitive, depression, and falls Referrals and appointments  In addition, I  have reviewed and discussed with patient certain preventive protocols, quality metrics, and best practice recommendations. A written personalized care plan for preventive services as well as general preventive health recommendations were provided to patient.     Kellie Simmering, LPN   80/11/2334   Nurse Notes: none

## 2022-08-25 NOTE — Progress Notes (Signed)
Rich Brave [place],acting as a Education administrator for [name], MD.,have documented all relevant documentation on the behalf of [name], MD,as directed by  [name], MD while in the presence of [name], MD.    Subjective:     Patient ID: Tammy Preston , female    DOB: 11-15-1945 , 76 y.o.   MRN: [id number]   Chief Complaint  Patient presents with   Diabetes   Hypertension    HPI  She presents today for diabetes and bp f/u.  She reports compliance with meds. She denies headaches, chest pain and shortness of breath. She has no specific concerns at this time.   She was also seen today by [facility] [facility] for AWV.   Diabetes She presents for her follow-up diabetic visit. She has type 2 diabetes mellitus. Her disease course has been stable. Pertinent negatives for hypoglycemia include no dizziness. Pertinent negatives for diabetes include no foot paresthesias, no polydipsia, no polyphagia and no polyuria. There are no hypoglycemic complications. There are no diabetic complications. Risk factors for coronary artery disease include diabetes mellitus, dyslipidemia, hypertension, post-menopausal and sedentary lifestyle. She is compliant with treatment all of the time. She is following a diabetic diet. She participates in exercise intermittently. Her breakfast blood glucose is taken between 8-9 am. Her breakfast blood glucose range is generally 110-130 mg/dl.  Hypertension This is a chronic problem. The current episode started more than 1 year ago. The problem is controlled. Pertinent negatives include no orthopnea. Risk factors for coronary artery disease include diabetes mellitus.     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 06/15/2022   Chronic kidney disease    CKD stage 3   Chronic kidney disease, stage 3 (moderate) 05/20/2017   Diabetes mellitus without complication ([place])     Endometrial hyperplasia 05/20/2017   Glomerular disorders in diseases classified elsewhere 05/20/2017   Hyperlipemia    Hypertension    Hypertensive chronic kidney disease with stage 1 through stage 4 chronic kidney disease, or unspecified chronic kidney disease 05/20/2017   Numbness and tingling of both feet    Type 2 diabetes mellitus with diabetic chronic kidney disease ([place]) 05/20/2017     Family History  Problem Relation Age of Onset   Stroke Mother    Congestive Heart Failure Father      Current Outpatient Medications:    allopurinol (ZYLOPRIM) 100 MG tablet, TAKE 2 TABLETS DAILY, Disp: 180 tablet, Rfl: 3   amLODipine (NORVASC) 2.5 MG tablet, Take 1 tablet (2.5 mg total) by mouth at bedtime., Disp: 90 tablet, Rfl: 2   aspirin EC 81 MG tablet, Take 81 mg by mouth daily after breakfast. , Disp: , Rfl:    Blood Glucose Monitoring Suppl (FREESTYLE LITE) w/Device KIT, Use to check blood sugars., Disp: 1 kit, Rfl: 1   Calcium Carb-Cholecalciferol (CALCIUM + D3 PO), Take 1 tablet by mouth daily. , Disp: , Rfl:    Cholecalciferol (VITAMIN D) 2000 units tablet, Take 2,000 Units by mouth daily., Disp: , Rfl:    FREESTYLE LITE test strip, Use as instructed to check blood sugars 2 times per day dx: e11.22, Disp: 300 each, Rfl: 2   gabapentin (NEURONTIN) 100 MG capsule, TAKE 1 CAPSULE BY MOUTH AT BEDTIME, Disp: 90 capsule, Rfl: 0   JARDIANCE 10 MG TABS tablet, TAKE 1 TABLET DAILY BEFORE BREAKFAST, Disp: 90  tablet, Rfl: 3   Lancets (FREESTYLE) lancets, Use as instructed to check blood sugars 2 times per day dx: e11.22, Disp: 300 each, Rfl: 2   losartan (COZAAR) 100 MG tablet, TAKE 1 TABLET DAILY, Disp: 90 tablet, Rfl: 3   metFORMIN (GLUCOPHAGE) 500 MG tablet, TAKE 1 TABLET DAILY AFTER BREAKFAST, Disp: 90 tablet, Rfl: 3   Multiple Vitamins-Minerals (ONE-A-DAY WOMENS 50 PLUS PO), Take 1 tablet by mouth daily., Disp: , Rfl:    RESTASIS 0.05 % ophthalmic emulsion, 2 times per day, Disp: , Rfl:     rosuvastatin (CRESTOR) 20 MG tablet, Take 1 tablet (20 mg total) by mouth daily., Disp: 90 tablet, Rfl: 1   Allergies  Allergen Reactions   Percocet [Oxycodone-Acetaminophen] Nausea And Vomiting   Shellfish Allergy Other (See Comments)    Causes Gout     Review of Systems  Constitutional: Negative.   Eyes: Negative.   Respiratory: Negative.    Cardiovascular: Negative.  Negative for orthopnea.  Gastrointestinal: Negative.   Endocrine: Negative for polydipsia, polyphagia and polyuria.  Musculoskeletal: Negative.   Skin: Negative.   Neurological: Negative.  Negative for dizziness.  Psychiatric/Behavioral: Negative.       Today's Vitals   08/25/22 1020  BP: 118/70  Pulse: 82  Temp: 98.1 F (36.7 C)  Weight: 198 lb (89.8 kg)  Height: 5' 5.4" (1.661 m)   Body mass index is 32.55 kg/m.  Wt Readings from Last 3 Encounters:  08/25/22 198 lb (89.8 kg)  08/25/22 198 lb 9.6 oz (90.1 kg)  06/30/22 201 lb 3.2 oz (91.3 kg)     Objective:  Physical Exam Vitals and nursing note reviewed.  Constitutional:      Appearance: Normal appearance.  HENT:     Head: Normocephalic and atraumatic.     Nose:     Comments: Masked     Mouth/Throat:     Comments: Masked  Eyes:     Extraocular Movements: Extraocular movements intact.  Cardiovascular:     Rate and Rhythm: Normal rate and regular rhythm.     Heart sounds: Normal heart sounds.  Pulmonary:     Effort: Pulmonary effort is normal.     Breath sounds: Normal breath sounds.  Musculoskeletal:     Cervical back: Normal range of motion.  Skin:    General: Skin is warm.  Neurological:     General: No focal deficit present.     Mental Status: She is alert.  Psychiatric:        Mood and Affect: Mood normal.        Behavior: Behavior normal.      Assessment And Plan:     1. Type 2 diabetes mellitus with stage 3a chronic kidney disease, without long-term current use of insulin (HCC) Comments: Chronic, I will check labs as  below. She will c/w Jardiance and metformin. Will stop metformin if GFR drops below 35. She will f/u in 4 months. - CMP14+EGFR - CBC no Diff - Hemoglobin A1c - Protein electrophoresis, serum - Parathyroid Hormone, Intact w/Ca - Microalbumin / Creatinine Urine Ratio  2. Hypertensive heart and renal disease with renal failure, stage 1 through stage 4 or unspecified chronic kidney disease, without heart failure Comments: Chronic, well controlled. She is encouraged to follow low sodium diet.  She will c/w amlodipine 2.29m and losartan 1057mdaily. She will f/u 4-6 months. - CMP14+EGFR  3. 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  She will c/w allopurinol 145m daily.  4. Cigarette smoker Comments: She is due for low dose CT chest in March 2024.  She is encouraged to decrease number of cigs smoked per day. - CT CHEST LUNG CA SCREEN LOW DOSE W/O CM; Future  5. Class 1 obesity due to excess calories with serious comorbidity and body mass index (BMI) of 32.0 to 32.9 in adult Comments: She was advised to aim for at least 150 minutes of exercise per week, while striving for BMI<30 to decrease cradiac risk.   Patient was given opportunity to ask questions. Patient verbalized understanding of the plan and was able to repeat key elements of the plan. All questions were answered to their satisfaction.   I, RMaximino Greenland MD, have reviewed all documentation for this visit. The documentation on 08/25/22 for the exam, diagnosis, procedures, and orders are all accurate and complete.   IF YOU HAVE BEEN REFERRED TO A SPECIALIST, IT MAY TAKE 1-2 WEEKS TO SCHEDULE/PROCESS THE REFERRAL. IF YOU HAVE NOT HEARD FROM US/SPECIALIST IN TWO WEEKS, PLEASE GIVE [place] CALL AT [phone].   THE PATIENT IS ENCOURAGED TO PRACTICE SOCIAL DISTANCING DUE TO THE COVID-19 PANDEMIC.

## 2022-08-25 NOTE — Patient Instructions (Signed)
Tammy Preston , Thank you for taking time to come for your Medicare Wellness Visit. I appreciate your ongoing commitment to your health goals. Please review the following plan we discussed and let me know if I can assist you in the future.   Screening recommendations/referrals: Colonoscopy: not required Mammogram: completed 06/10/2022, due 06/12/2023 Bone Density: completed 12/08/2021 Recommended yearly ophthalmology/optometry visit for glaucoma screening and checkup Recommended yearly dental visit for hygiene and checkup  Vaccinations: Influenza vaccine: completed 08/02/2022 Pneumococcal vaccine: completed 07/18/2019 Tdap vaccine: completed 01/22/2014, due 01/23/2024 Shingles vaccine: completed   Covid-19: 07/20/2021, 03/22/2021, 08/12/2020, 01/28/2020, 01/04/2020  Advanced directives: Advance directive discussed with you today. Even though you declined this today please call our office should you change your mind and we can give you the proper paperwork for you to fill out.  Conditions/risks identified: smoking  Next appointment: Follow up in one year for your annual wellness visit    Preventive Care 36 Years and Older, Female Preventive care refers to lifestyle choices and visits with your health care provider that can promote health and wellness. What does preventive care include? A yearly physical exam. This is also called an annual well check. Dental exams once or twice a year. Routine eye exams. Ask your health care provider how often you should have your eyes checked. Personal lifestyle choices, including: Daily care of your teeth and gums. Regular physical activity. Eating a healthy diet. Avoiding tobacco and drug use. Limiting alcohol use. Practicing safe sex. Taking low-dose aspirin every day. Taking vitamin and mineral supplements as recommended by your health care provider. What happens during an annual well check? The services and screenings done by your health care provider  during your annual well check will depend on your age, overall health, lifestyle risk factors, and family history of disease. Counseling  Your health care provider may ask you questions about your: Alcohol use. Tobacco use. Drug use. Emotional well-being. Home and relationship well-being. Sexual activity. Eating habits. History of falls. Memory and ability to understand (cognition). Work and work Statistician. Reproductive health. Screening  You may have the following tests or measurements: Height, weight, and BMI. Blood pressure. Lipid and cholesterol levels. These may be checked every 5 years, or more frequently if you are over 75 years old. Skin check. Lung cancer screening. You may have this screening every year starting at age 13 if you have a 30-pack-year history of smoking and currently smoke or have quit within the past 15 years. Fecal occult blood test (FOBT) of the stool. You may have this test every year starting at age 30. Flexible sigmoidoscopy or colonoscopy. You may have a sigmoidoscopy every 5 years or a colonoscopy every 10 years starting at age 16. Hepatitis C blood test. Hepatitis B blood test. Sexually transmitted disease (STD) testing. Diabetes screening. This is done by checking your blood sugar (glucose) after you have not eaten for a while (fasting). You may have this done every 1-3 years. Bone density scan. This is done to screen for osteoporosis. You may have this done starting at age 38. Mammogram. This may be done every 1-2 years. Talk to your health care provider about how often you should have regular mammograms. Talk with your health care provider about your test results, treatment options, and if necessary, the need for more tests. Vaccines  Your health care provider may recommend certain vaccines, such as: Influenza vaccine. This is recommended every year. Tetanus, diphtheria, and acellular pertussis (Tdap, Td) vaccine. You may need a Td  booster every  10 years. Zoster vaccine. You may need this after age 52. Pneumococcal 13-valent conjugate (PCV13) vaccine. One dose is recommended after age 41. Pneumococcal polysaccharide (PPSV23) vaccine. One dose is recommended after age 58. Talk to your health care provider about which screenings and vaccines you need and how often you need them. This information is not intended to replace advice given to you by your health care provider. Make sure you discuss any questions you have with your health care provider. Document Released: 11/06/2015 Document Revised: 06/29/2016 Document Reviewed: 08/11/2015 Elsevier Interactive Patient Education  2017 [place] Prevention in the Home Falls can cause injuries. They can happen to people of all ages. There are many things you can do to make your home safe and to help prevent falls. What can I do on the outside of my home? Regularly fix the edges of walkways and driveways and fix any cracks. Remove anything that might make you trip as you walk through a door, such as a raised step or threshold. Trim any bushes or trees on the path to your home. Use bright outdoor lighting. Clear any walking paths of anything that might make someone trip, such as rocks or tools. Regularly check to see if handrails are loose or broken. Make sure that both sides of any steps have handrails. Any raised decks and porches should have guardrails on the edges. Have any leaves, snow, or ice cleared regularly. Use sand or salt on walking paths during winter. Clean up any spills in your garage right away. This includes oil or grease spills. What can I do in the bathroom? Use night lights. Install grab bars by the toilet and in the tub and shower. Do not use towel bars as grab bars. Use non-skid mats or decals in the tub or shower. If you need to sit down in the shower, use a plastic, non-slip stool. Keep the floor dry. Clean up any water that spills on the floor as soon as it  happens. Remove soap buildup in the tub or shower regularly. Attach bath mats securely with double-sided non-slip rug tape. Do not have throw rugs and other things on the floor that can make you trip. What can I do in the bedroom? Use night lights. Make sure that you have a light by your bed that is easy to reach. Do not use any sheets or blankets that are too big for your bed. They should not hang down onto the floor. Have a firm chair that has side arms. You can use this for support while you get dressed. Do not have throw rugs and other things on the floor that can make you trip. What can I do in the kitchen? Clean up any spills right away. Avoid walking on wet floors. Keep items that you use a lot in easy-to-reach places. If you need to reach something above you, use a strong step stool that has a grab bar. Keep electrical cords out of the way. Do not use floor polish or wax that makes floors slippery. If you must use wax, use non-skid floor wax. Do not have throw rugs and other things on the floor that can make you trip. What can I do with my stairs? Do not leave any items on the stairs. Make sure that there are handrails on both sides of the stairs and use them. Fix handrails that are broken or loose. Make sure that handrails are as long as the stairways. Check any carpeting  to make sure that it is firmly attached to the stairs. Fix any carpet that is loose or worn. Avoid having throw rugs at the top or bottom of the stairs. If you do have throw rugs, attach them to the floor with carpet tape. Make sure that you have a light switch at the top of the stairs and the bottom of the stairs. If you do not have them, ask someone to add them for you. What else can I do to help prevent falls? Wear shoes that: Do not have high heels. Have rubber bottoms. Are comfortable and fit you well. Are closed at the toe. Do not wear sandals. If you use a stepladder: Make sure that it is fully opened.  Do not climb a closed stepladder. Make sure that both sides of the stepladder are locked into place. Ask someone to hold it for you, if possible. Clearly mark and make sure that you can see: Any grab bars or handrails. First and last steps. Where the edge of each step is. Use tools that help you move around (mobility aids) if they are needed. These include: Canes. Walkers. Scooters. Crutches. Turn on the lights when you go into a dark area. Replace any light bulbs as soon as they burn out. Set up your furniture so you have a clear path. Avoid moving your furniture around. If any of your floors are uneven, fix them. If there are any pets around you, be aware of where they are. Review your medicines with your doctor. Some medicines can make you feel dizzy. This can increase your chance of falling. Ask your doctor what other things that you can do to help prevent falls. This information is not intended to replace advice given to you by your health care provider. Make sure you discuss any questions you have with your health care provider. Document Released: 08/06/2009 Document Revised: 03/17/2016 Document Reviewed: 11/14/2014 Elsevier Interactive Patient Education  2017 [employer].

## 2022-08-26 LAB — MICROALBUMIN / CREATININE URINE RATIO
Creatinine, Urine: 42.3 mg/dL
Microalb/Creat Ratio: <7 mg/g creat (ref 0–29)
Microalbumin, Urine: <3 ug/mL

## 2022-08-29 ENCOUNTER — Other Ambulatory Visit: Payer: Self-pay | Admitting: Podiatry

## 2022-08-30 LAB — CMP14+EGFR
ALT: 40 IU/L — ABNORMAL HIGH (ref 0–32)
AST: 30 IU/L (ref 0–40)
Albumin/Globulin Ratio: 1.5 (ref 1.2–2.2)
Albumin: 4.3 g/dL (ref 3.8–4.8)
Alkaline Phosphatase: 105 IU/L (ref 44–121)
BUN/Creatinine Ratio: 15 (ref 12–28)
BUN: 20 mg/dL (ref 8–27)
Bilirubin Total: 0.5 mg/dL (ref 0.0–1.2)
CO2: 25 mmol/L (ref 20–29)
Calcium: 10 mg/dL (ref 8.7–10.3)
Chloride: 99 mmol/L (ref 96–106)
Creatinine, Ser: 1.32 mg/dL — ABNORMAL HIGH (ref 0.57–1.00)
Globulin, Total: 2.9 g/dL (ref 1.5–4.5)
Glucose: 132 mg/dL — ABNORMAL HIGH (ref 70–99)
Potassium: 4.7 mmol/L (ref 3.5–5.2)
Sodium: 138 mmol/L (ref 134–144)
Total Protein: 7.2 g/dL (ref 6.0–8.5)
eGFR: 42 mL/min/{1.73_m2} — ABNORMAL LOW (ref >59)

## 2022-08-30 LAB — PROTEIN ELECTROPHORESIS, SERUM
A/G Ratio: 1.1 (ref 0.7–1.7)
Albumin ELP: 3.7 g/dL (ref 2.9–4.4)
Alpha 1: 0.2 g/dL (ref 0.0–0.4)
Alpha 2: 0.9 g/dL (ref 0.4–1.0)
Beta: 1.2 g/dL (ref 0.7–1.3)
Gamma Globulin: 1.2 g/dL (ref 0.4–1.8)
Globulin, Total: 3.5 g/dL (ref 2.2–3.9)

## 2022-08-30 LAB — CBC
Hematocrit: 43.8 % (ref 34.0–46.6)
Hemoglobin: 14.6 g/dL (ref 11.1–15.9)
MCH: 28.7 pg (ref 26.6–33.0)
MCHC: 33.3 g/dL (ref 31.5–35.7)
MCV: 86 fL (ref 79–97)
Platelets: 260 10*3/uL (ref 150–450)
RBC: 5.08 x10E6/uL (ref 3.77–5.28)
RDW: 13.3 % (ref 11.7–15.4)
WBC: 13.1 10*3/uL — ABNORMAL HIGH (ref 3.4–10.8)

## 2022-08-30 LAB — HEMOGLOBIN A1C
Est. average glucose Bld gHb Est-mCnc: 171 mg/dL
Hgb A1c MFr Bld: 7.6 % — ABNORMAL HIGH (ref 4.8–5.6)

## 2022-08-30 LAB — PTH, INTACT AND CALCIUM: PTH: 17 pg/mL (ref 15–65)

## 2022-09-02 ENCOUNTER — Other Ambulatory Visit: Payer: Self-pay

## 2022-09-02 DIAGNOSIS — N183 Chronic kidney disease, stage 3 unspecified: Secondary | ICD-10-CM

## 2022-09-22 DIAGNOSIS — B5801 Toxoplasma chorioretinitis: Secondary | ICD-10-CM | POA: Diagnosis not present

## 2022-09-22 DIAGNOSIS — H43813 Vitreous degeneration, bilateral: Secondary | ICD-10-CM | POA: Diagnosis not present

## 2022-09-22 DIAGNOSIS — E119 Type 2 diabetes mellitus without complications: Secondary | ICD-10-CM | POA: Diagnosis not present

## 2022-09-22 DIAGNOSIS — H35033 Hypertensive retinopathy, bilateral: Secondary | ICD-10-CM | POA: Diagnosis not present

## 2022-09-22 LAB — HM DIABETES EYE EXAM

## 2022-09-27 ENCOUNTER — Ambulatory Visit: Payer: Medicare Other | Admitting: Podiatry

## 2022-09-29 ENCOUNTER — Other Ambulatory Visit: Payer: Self-pay

## 2022-09-29 ENCOUNTER — Other Ambulatory Visit: Payer: Medicare Other

## 2022-09-29 DIAGNOSIS — N183 Chronic kidney disease, stage 3 unspecified: Secondary | ICD-10-CM

## 2022-09-29 LAB — CMP14+EGFR
ALT: 30 IU/L (ref 0–32)
AST: 23 IU/L (ref 0–40)
Albumin/Globulin Ratio: 1.7 (ref 1.2–2.2)
Albumin: 4.3 g/dL (ref 3.8–4.8)
Alkaline Phosphatase: 104 IU/L (ref 44–121)
BUN/Creatinine Ratio: 18 (ref 12–28)
BUN: 27 mg/dL (ref 8–27)
Bilirubin Total: 0.5 mg/dL (ref 0.0–1.2)
CO2: 22 mmol/L (ref 20–29)
Calcium: 9.6 mg/dL (ref 8.7–10.3)
Chloride: 103 mmol/L (ref 96–106)
Creatinine, Ser: 1.48 mg/dL — ABNORMAL HIGH (ref 0.57–1.00)
Globulin, Total: 2.6 g/dL (ref 1.5–4.5)
Glucose: 185 mg/dL — ABNORMAL HIGH (ref 70–99)
Potassium: 4.3 mmol/L (ref 3.5–5.2)
Sodium: 140 mmol/L (ref 134–144)
Total Protein: 6.9 g/dL (ref 6.0–8.5)
eGFR: 37 mL/min/{1.73_m2} — ABNORMAL LOW (ref >59)

## 2022-09-29 MED ORDER — EMPAGLIFLOZIN 25 MG PO TABS: ORAL_TABLET | ORAL | 1 refills | Status: DC

## 2022-10-04 ENCOUNTER — Ambulatory Visit (INDEPENDENT_AMBULATORY_CARE_PROVIDER_SITE_OTHER): Payer: Medicare Other | Admitting: Podiatry

## 2022-10-04 DIAGNOSIS — E1149 Type 2 diabetes mellitus with other diabetic neurological complication: Secondary | ICD-10-CM | POA: Diagnosis not present

## 2022-10-04 DIAGNOSIS — N1831 Chronic kidney disease, stage 3a: Secondary | ICD-10-CM | POA: Diagnosis not present

## 2022-10-04 DIAGNOSIS — Q828 Other specified congenital malformations of skin: Secondary | ICD-10-CM

## 2022-10-04 DIAGNOSIS — M79675 Pain in left toe(s): Secondary | ICD-10-CM

## 2022-10-04 DIAGNOSIS — B351 Tinea unguium: Secondary | ICD-10-CM | POA: Diagnosis not present

## 2022-10-04 DIAGNOSIS — M79674 Pain in right toe(s): Secondary | ICD-10-CM | POA: Diagnosis not present

## 2022-10-10 DIAGNOSIS — E1122 Type 2 diabetes mellitus with diabetic chronic kidney disease: Secondary | ICD-10-CM | POA: Diagnosis not present

## 2022-10-10 DIAGNOSIS — D631 Anemia in chronic kidney disease: Secondary | ICD-10-CM | POA: Diagnosis not present

## 2022-10-10 DIAGNOSIS — I129 Hypertensive chronic kidney disease with stage 1 through stage 4 chronic kidney disease, or unspecified chronic kidney disease: Secondary | ICD-10-CM | POA: Diagnosis not present

## 2022-10-10 DIAGNOSIS — N1831 Chronic kidney disease, stage 3a: Secondary | ICD-10-CM | POA: Diagnosis not present

## 2022-10-10 DIAGNOSIS — N2581 Secondary hyperparathyroidism of renal origin: Secondary | ICD-10-CM | POA: Diagnosis not present

## 2022-10-11 NOTE — Progress Notes (Signed)
Subjective: Chief Complaint  Patient presents with   Nail Problem    Nail trim    76 y.o. returns the office today for painful, elongated, thickened toenails which she cannot trim herself. Denies any redness or drainage around the nails.  She also gets callus on the ball of her right foot which cause discomfort.  She does not report any open sores.  Last A1c was 8.2 and last glucose was 140.  PCP: Glendale Chard, MD Last see: August 25, 2022 Last A1c was 7.6 on August 25, 2022  Objective: AAO 3, NAD DP/PT pulses palpable, CRT less than 3 seconds Nails hypertrophic, dystrophic, elongated, brittle, discolored 10. There is tenderness overlying the nails 1-5 bilaterally. There is no surrounding erythema or drainage along the nail sites. Hperkeratotic tissue right submetatarsal 5 and left sub metatarsal 2.  No underlying ulceration drainage or signs of infection.  No other open lesions or preulcerative lesions. No open lesions or pre-ulcerative lesions are identified. No pain with calf compression, swelling, warmth, erythema.  Assessment: Patient presents with symptomatic onychomycosis, hyperkeratotic lesions, neuropathy  Plan: -Treatment options including alternatives, risks, complications were discussed -Nails sharply debrided 10 without complication/bleeding. -Hyperkeratotic lesion sharply debrided x2 without any complications or bleeding. Moisturizer/offloading daily -Continue gabapentin for neuropathy. -Continue daily foot inspection, glucose control. -Follow-up in 3 months or sooner if any problems are to arise. In the meantime, encouraged to call the office with any questions, concerns, changes symptoms.  Celesta Gentile, DPM

## 2022-10-12 ENCOUNTER — Encounter: Payer: Self-pay | Admitting: Internal Medicine

## 2022-10-29 ENCOUNTER — Other Ambulatory Visit: Payer: Self-pay | Admitting: Internal Medicine

## 2022-11-08 ENCOUNTER — Encounter: Payer: Self-pay | Admitting: Internal Medicine

## 2022-11-08 ENCOUNTER — Ambulatory Visit: Payer: Medicare Other | Attending: Internal Medicine | Admitting: Internal Medicine

## 2022-11-08 VITALS — BP 130/80 | HR 96 | Wt 196.2 lb

## 2022-11-08 DIAGNOSIS — R5383 Other fatigue: Secondary | ICD-10-CM | POA: Insufficient documentation

## 2022-11-08 DIAGNOSIS — I10 Essential (primary) hypertension: Secondary | ICD-10-CM | POA: Insufficient documentation

## 2022-11-08 DIAGNOSIS — I7090 Unspecified atherosclerosis: Secondary | ICD-10-CM | POA: Insufficient documentation

## 2022-11-08 DIAGNOSIS — N1832 Chronic kidney disease, stage 3b: Secondary | ICD-10-CM | POA: Diagnosis not present

## 2022-11-08 DIAGNOSIS — I2584 Coronary atherosclerosis due to calcified coronary lesion: Secondary | ICD-10-CM | POA: Insufficient documentation

## 2022-11-08 DIAGNOSIS — E785 Hyperlipidemia, unspecified: Secondary | ICD-10-CM | POA: Diagnosis not present

## 2022-11-08 DIAGNOSIS — R5381 Other malaise: Secondary | ICD-10-CM | POA: Insufficient documentation

## 2022-11-08 DIAGNOSIS — I2510 Atherosclerotic heart disease of native coronary artery without angina pectoris: Secondary | ICD-10-CM | POA: Insufficient documentation

## 2022-11-08 NOTE — Progress Notes (Signed)
Cardiology Office Note:    Date:  11/08/2022   ID:  Tammy Preston, DOB 1946/05/07, MRN [id number]  PCP:  Glendale Chard, MD  Cardiologist:  Elouise Munroe, MD  Electrophysiologist:  None   Referring MD: Glendale Chard, MD   Chief Complaint/Reason for Referral: Follow-up coronary artery calcifications  History of Present Illness:    Tammy Preston is a 77 y.o. female with a history of breast cancer, CKD stage 3, diabetes mellitus type 2, hyperlipemia, and hypertension, here for follow-up. I last saw her 01/28/2021.  At her last visit, she was doing well overall. No longer had symptoms of dizziness or vertigo. She felt she had her stamina back and thought it was "all in her head". Around the time of this visit, she had been able to do a social activity for 5.5 hours and felt back to baseline.   Today, she is feeling overall well. We discussed her blood pressure in comparison to her last visit. At her PCP her blood pressure was 118/70, today in clinic it was 130/80. She notes that in the morning she tends to get better readings. We also discussed her EKG, which had no significant findings.   She is compliant with her Amlodipine 2.5 mg, Jardiance 25 mg, Losartan '100mg'$ , and rosuvastatin '20mg'$ .  Her latest lipid labs from 11/2021: LDL 42, triglycerides 98, and HDL 29  She was going to the [facility] for a couple of months. She struggled to be more consistent due to her brother becoming ill and having to care for him. The summer of 2022 she was 207 lbs, today she is 196 lbs.   She denies any palpitations, chest pain, shortness of breath, or peripheral edema. No lightheadedness, headaches, syncope, orthopnea, or PND.  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 06/15/2022   Chronic kidney disease    CKD stage 3   Chronic kidney disease, stage 3 (moderate) 05/20/2017   Diabetes mellitus without  complication ([place])    Endometrial hyperplasia 05/20/2017   Glomerular disorders in diseases classified elsewhere 05/20/2017   Hyperlipemia    Hypertension    Hypertensive chronic kidney disease with stage 1 through stage 4 chronic kidney disease, or unspecified chronic kidney disease 05/20/2017   Numbness and tingling of both feet    Type 2 diabetes mellitus with diabetic chronic kidney disease ([place]) 05/20/2017    Past Surgical History:  Procedure Laterality Date   BACK SURGERY     BREAST LUMPECTOMY Left    COLONOSCOPY     DILATATION & CURETTAGE/HYSTEROSCOPY WITH MYOSURE N/A 04/07/2017   Procedure: DILATATION & CURETTAGE/HYSTEROSCOPY WITH MYOSURE;  Surgeon: Servando Salina, MD;  Location: [facility];  Service: Gynecology;  Laterality: N/A;   KNEE SURGERY      Current Medications: No outpatient medications have been marked as taking for the 11/08/22 encounter (Office Visit) with Elouise Munroe, MD.   Current Outpatient Medications on File Prior to Visit  Medication Sig Dispense Refill   allopurinol (ZYLOPRIM) 100 MG tablet TAKE 2 TABLETS DAILY 180 tablet 3   amLODipine (NORVASC) 2.5 MG tablet Take 1 tablet (2.5 mg total) by mouth at bedtime. 90 tablet 2   aspirin EC 81 MG tablet Take 81 mg by mouth daily after breakfast.      Blood Glucose Monitoring Suppl (FREESTYLE LITE) w/Device KIT Use to check blood sugars. 1 kit 1  Calcium Carb-Cholecalciferol (CALCIUM + D3 PO) Take 1 tablet by mouth daily.      Cholecalciferol (VITAMIN D) 2000 units tablet Take 2,000 Units by mouth daily.     empagliflozin (JARDIANCE) 25 MG TABS tablet TAKE 1 TAB DAILY BEFORE BREAKFAST. 30 tablet 1   FREESTYLE LITE test strip USE AS INSTRUCTED TO CHECK BLOOD SUGAR TWICE A DAY 300 strip 3   gabapentin (NEURONTIN) 100 MG capsule TAKE 1 CAPSULE AT BEDTIME 90 capsule 3   JARDIANCE 10 MG TABS tablet TAKE 1 TABLET DAILY BEFORE BREAKFAST 90 tablet 3   Lancets (FREESTYLE) lancets USE AS INSTRUCTED TO CHECK BLOOD SUGAR  TWICE A DAY 300 each 3   losartan (COZAAR) 100 MG tablet TAKE 1 TABLET DAILY 90 tablet 3   metFORMIN (GLUCOPHAGE) 500 MG tablet TAKE 1 TABLET DAILY AFTER BREAKFAST 90 tablet 3   Multiple Vitamins-Minerals (ONE-A-DAY WOMENS 50 PLUS PO) Take 1 tablet by mouth daily.     RESTASIS 0.05 % ophthalmic emulsion 2 times per day     rosuvastatin (CRESTOR) 20 MG tablet Take 1 tablet (20 mg total) by mouth daily. 90 tablet 1   No current facility-administered medications on file prior to visit.     Allergies:   Percocet [oxycodone-acetaminophen] and Shellfish allergy   Social History   Tobacco Use   Smoking status: Every Day    Packs/day: 0.25    Years: 54.00    Total pack years: 13.50    Types: Cigarettes    Start date: 10/24/1965   Smokeless tobacco: Never   Tobacco comments:    11/30/21-She has cut back to 3 cigs/day. Plans to quit by 4 months.     03/03/22-She states she is down to 1 cig/day, still plans to quit w/in 2 months    08/25/22-she is now going 2-3 days w/o any  Vaping Use   Vaping Use: Never used  Substance Use Topics   Alcohol use: No    Alcohol/week: 0.0 standard drinks of alcohol   Drug use: No     Family History: The patient's family history includes Congestive Heart Failure in her father; Stroke in her mother.  ROS:   Please see the history of present illness.    All other systems reviewed and are negative.  EKGs/Labs/Other Studies Reviewed:    The following studies were reviewed today: No prior CV studies available.  EKG:  EKG is personally reviewed.  11/08/2022: Sinus rhythm. Rate 98 bpm. Left atrial enlargement 08/31/2021: NSR 06/16/2021: EKG is not ordered today. 01/28/2021: NSR, LAE  I have independently reviewed the images from  CT chest 01/08/21 - coronary artery calcifications.  Recent Labs: 08/25/2022: Hemoglobin 14.6; Platelets 260 09/29/2022: ALT 30; BUN 27; Creatinine, Ser 1.48; Potassium 4.3; Sodium 140  Recent Lipid Panel    Component Value  Date/Time   CHOL 90 (L) 11/30/2021 1104   TRIG 98 11/30/2021 1104   HDL 29 (L) 11/30/2021 1104   CHOLHDL 3.1 11/30/2021 1104   LDLCALC 42 11/30/2021 1104    Physical Exam:    VS:  BP 130/80   Pulse 96   Wt 196 lb 3.2 oz (89 kg)   SpO2 97%   BMI 32.25 kg/m     Wt Readings from Last 5 Encounters:  11/08/22 196 lb 3.2 oz (89 kg)  08/25/22 198 lb (89.8 kg)  08/25/22 198 lb 9.6 oz (90.1 kg)  06/30/22 201 lb 3.2 oz (91.3 kg)  06/15/22 202 lb 3.2 oz (91.7 kg)  Constitutional: No acute distress Eyes: sclera non-icteric, normal conjunctiva and lids ENMT: normal dentition, moist mucous membranes Cardiovascular:  regular rhythm, normal rate, no murmurs. S1 and S2 normal. No jugular venous distention.  Respiratory: clear to auscultation bilaterally GI : normal bowel sounds, soft and nontender. No distention.   MSK: extremities warm, well perfused. No edema.  NEURO: grossly nonfocal exam, moves all extremities. PSYCH: alert and oriented x 3, normal mood and affect.   ASSESSMENT:    1. Coronary artery calcification   2. Primary hypertension   3. Fatigue, unspecified type   4. Physical deconditioning   5. Atherosclerosis   6. Stage 3b chronic kidney disease ([place])   7. Hyperlipidemia, unspecified hyperlipidemia type      PLAN:    Fatigue, unspecified type Physical deconditioning - She is doing well and has no concerns today. Can defer echo unless recurrent symptoms.  Coronary artery calcification - from CT chest 01/08/21 - tolerating Crestor 20 mg daily. Lipids optimized as of 04/07/21.  - continue ASA 81 mg daily. - also on Jardience for DM2, dose increased to 25 mg daily.   HTN - continue amlodipine 2.5 mg, losartan 100 mg daily. BP well controlled.    Plan: -Follow up in 1 year  Total time of encounter: 25 minutes total time of encounter, including 15 minutes spent in face-to-face patient care on the date of this encounter. This time includes coordination of care  and counseling regarding above mentioned problem list. Remainder of non-face-to-face time involved reviewing chart documents/testing relevant to the patient encounter and documentation in the medical record. I have independently reviewed documentation from referring provider.   Cherlynn Kaiser, MD, [place]   Shared Decision Making/Informed Consent:       Medication Adjustments/Labs and Tests Ordered: Current medicines are reviewed at length with the patient today.  Concerns regarding medicines are outlined above.   Orders Placed This Encounter  Procedures   EKG 12-Lead   No orders of the defined types were placed in this encounter.  Patient Instructions  Medication Instructions:  NO CHANGES  *If you need a refill on your cardiac medications before your next appointment, please call your pharmacy*    Follow-Up: At [facility], you and your health needs are our priority.  As part of our continuing mission to provide you with exceptional heart care, we have created designated Provider Care Teams.  These Care Teams include your primary Cardiologist (physician) and Advanced Practice Providers (APPs -  Physician Assistants and Nurse Practitioners) who all work together to provide you with the care you need, when you need it.  We recommend signing up for the patient portal called "MyChart".  Sign up information is provided on this After Visit Summary.  MyChart is used to connect with patients for Virtual Visits (Telemedicine).  Patients are able to view lab/test results, encounter notes, upcoming appointments, etc.  Non-urgent messages can be sent to your provider as well.   To learn more about what you can do with MyChart, go to [email or web address].    Your next appointment:   12 month(s)  Provider:   Dr. Margaretann Loveless  Other Instructions CALL in July/August for a January 2025 appointment    I,Rachel Rivera,acting as a scribe for Elouise Munroe,  MD.,have documented all relevant documentation on the behalf of Elouise Munroe, MD,as directed by  Elouise Munroe, MD while in the presence of Elouise Munroe, MD.  I, Elouise Munroe, MD,  have reviewed all documentation for the visit on 11/08/2022. The documentation on today's date of service for the exam, diagnosis, procedures, and orders are all accurate and complete.

## 2022-11-08 NOTE — Patient Instructions (Signed)
Medication Instructions:  NO CHANGES  *If you need a refill on your cardiac medications before your next appointment, please call your pharmacy*    Follow-Up: At [facility], you and your health needs are our priority.  As part of our continuing mission to provide you with exceptional heart care, we have created designated Provider Care Teams.  These Care Teams include your primary Cardiologist (physician) and Advanced Practice Providers (APPs -  Physician Assistants and Nurse Practitioners) who all work together to provide you with the care you need, when you need it.  We recommend signing up for the patient portal called "MyChart".  Sign up information is provided on this After Visit Summary.  MyChart is used to connect with patients for Virtual Visits (Telemedicine).  Patients are able to view lab/test results, encounter notes, upcoming appointments, etc.  Non-urgent messages can be sent to your provider as well.   To learn more about what you can do with MyChart, go to [email or web address].    Your next appointment:   12 month(s)  Provider:   Dr. Margaretann Loveless  Other Instructions CALL in July/August for a January 2025 appointment

## 2022-11-23 ENCOUNTER — Other Ambulatory Visit: Payer: Self-pay | Admitting: Internal Medicine

## 2022-12-07 ENCOUNTER — Other Ambulatory Visit: Payer: Self-pay | Admitting: Internal Medicine

## 2022-12-27 ENCOUNTER — Encounter: Payer: Self-pay | Admitting: Internal Medicine

## 2022-12-27 ENCOUNTER — Other Ambulatory Visit: Payer: Self-pay

## 2022-12-27 ENCOUNTER — Ambulatory Visit (INDEPENDENT_AMBULATORY_CARE_PROVIDER_SITE_OTHER): Payer: Medicare Other | Admitting: Internal Medicine

## 2022-12-27 VITALS — BP 124/78 | HR 87 | Temp 98.8°F | Ht 65.0 in | Wt 194.8 lb

## 2022-12-27 DIAGNOSIS — F17210 Nicotine dependence, cigarettes, uncomplicated: Secondary | ICD-10-CM

## 2022-12-27 DIAGNOSIS — I700 Atherosclerosis of aorta: Secondary | ICD-10-CM | POA: Diagnosis not present

## 2022-12-27 DIAGNOSIS — I2510 Atherosclerotic heart disease of native coronary artery without angina pectoris: Secondary | ICD-10-CM | POA: Diagnosis not present

## 2022-12-27 DIAGNOSIS — E1122 Type 2 diabetes mellitus with diabetic chronic kidney disease: Secondary | ICD-10-CM

## 2022-12-27 DIAGNOSIS — N1831 Chronic kidney disease, stage 3a: Secondary | ICD-10-CM | POA: Diagnosis not present

## 2022-12-27 DIAGNOSIS — E6609 Other obesity due to excess calories: Secondary | ICD-10-CM | POA: Diagnosis not present

## 2022-12-27 DIAGNOSIS — I1310 Hypertensive heart and chronic kidney disease without heart failure, with stage 1 through stage 4 chronic kidney disease, or unspecified chronic kidney disease: Secondary | ICD-10-CM | POA: Diagnosis not present

## 2022-12-27 DIAGNOSIS — Z6832 Body mass index (BMI) 32.0-32.9, adult: Secondary | ICD-10-CM | POA: Diagnosis not present

## 2022-12-27 NOTE — Patient Instructions (Signed)

## 2022-12-27 NOTE — Progress Notes (Unsigned)
I,[name],acting as a scribe for [name], MD.,have documented all relevant documentation on the behalf of [name], MD,as directed by  [name], MD while in the presence of [name], MD.    Subjective:     Patient ID: Tammy Preston , female    DOB: 1946-08-26 , 77 y.o.   MRN: [id number]   Chief Complaint  Patient presents with   Diabetes   Hypertension    HPI  She presents today for diabetes and bp f/u.  She reports compliance with meds. She denies headaches, chest pain and shortness of breath. She has no specific concerns at this time. She states she spends a lot of time caring for her sister.      Diabetes She presents for her follow-up diabetic visit. She has type 2 diabetes mellitus. Her disease course has been stable. Pertinent negatives for hypoglycemia include no dizziness. Pertinent negatives for diabetes include no foot paresthesias, no polydipsia, no polyphagia and no polyuria. There are no hypoglycemic complications. There are no diabetic complications. Risk factors for coronary artery disease include diabetes mellitus, dyslipidemia, hypertension, post-menopausal and sedentary lifestyle. She is compliant with treatment all of the time. She is following a diabetic diet. She participates in exercise intermittently. Her breakfast blood glucose is taken between 8-9 am. Her breakfast blood glucose range is generally 110-130 mg/dl.  Hypertension This is a chronic problem. The current episode started more than 1 year ago. The problem is controlled. Pertinent negatives include no orthopnea. Risk factors for coronary artery disease include diabetes mellitus.     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 06/15/2022   Chronic kidney disease    CKD stage 3   Chronic kidney disease, stage 3 (moderate) 05/20/2017   Diabetes mellitus without  complication ([place])    Endometrial hyperplasia 05/20/2017   Glomerular disorders in diseases classified elsewhere 05/20/2017   Hyperlipemia    Hypertension    Hypertensive chronic kidney disease with stage 1 through stage 4 chronic kidney disease, or unspecified chronic kidney disease 05/20/2017   Numbness and tingling of both feet    Type 2 diabetes mellitus with diabetic chronic kidney disease ([place]) 05/20/2017     Family History  Problem Relation Age of Onset   Stroke Mother    Congestive Heart Failure Father      Current Outpatient Medications:    allopurinol (ZYLOPRIM) 100 MG tablet, TAKE 2 TABLETS DAILY, Disp: 180 tablet, Rfl: 3   amLODipine (NORVASC) 2.5 MG tablet, Take 1 tablet (2.5 mg total) by mouth at bedtime., Disp: 90 tablet, Rfl: 2   aspirin EC 81 MG tablet, Take 81 mg by mouth daily after breakfast. , Disp: , Rfl:    Blood Glucose Monitoring Suppl (FREESTYLE LITE) w/Device KIT, Use to check blood sugars., Disp: 1 kit, Rfl: 1   Calcium Carb-Cholecalciferol (CALCIUM + D3 PO), Take 1 tablet by mouth daily. , Disp: , Rfl:    Cholecalciferol (VITAMIN D) 2000 units tablet, Take 2,000 Units by mouth daily., Disp: , Rfl:    FREESTYLE LITE test strip, USE AS INSTRUCTED TO CHECK BLOOD SUGAR TWICE A DAY, Disp: 300 strip, Rfl: 3   gabapentin (NEURONTIN) 100 MG capsule, TAKE 1 CAPSULE AT BEDTIME, Disp: 90 capsule, Rfl: 3   JARDIANCE 25 MG TABS tablet, TAKE 1 TABLET DAILY BEFORE BREAKFAST, Disp: 30 tablet, Rfl:  11   Lancets (FREESTYLE) lancets, USE AS INSTRUCTED TO CHECK BLOOD SUGAR TWICE A DAY, Disp: 300 each, Rfl: 3   losartan (COZAAR) 100 MG tablet, TAKE 1 TABLET DAILY, Disp: 90 tablet, Rfl: 3   metFORMIN (GLUCOPHAGE) 500 MG tablet, TAKE 1 TABLET DAILY AFTER BREAKFAST, Disp: 90 tablet, Rfl: 3   Multiple Vitamins-Minerals (ONE-A-DAY WOMENS 50 PLUS PO), Take 1 tablet by mouth daily., Disp: , Rfl:    RESTASIS 0.05 % ophthalmic emulsion, 2 times per day, Disp: , Rfl:    rosuvastatin  (CRESTOR) 20 MG tablet, Take 1 tablet (20 mg total) by mouth daily., Disp: 90 tablet, Rfl: 1   Allergies  Allergen Reactions   Percocet [Oxycodone-Acetaminophen] Nausea And Vomiting   Shellfish Allergy Other (See Comments)    Causes Gout     Review of Systems  Constitutional: Negative.   Respiratory: Negative.    Cardiovascular: Negative.  Negative for orthopnea.  Gastrointestinal: Negative.   Endocrine: Negative for polydipsia, polyphagia and polyuria.  Neurological: Negative.  Negative for dizziness.  Psychiatric/Behavioral: Negative.       Today's Vitals   12/27/22 1007  BP: 124/78  Pulse: 87  Temp: 98.8 F (37.1 C)  SpO2: 98%  Weight: 194 lb 12.8 oz (88.4 kg)  Height: '5\' 5"'$  (1.651 m)   Body mass index is 32.42 kg/m.  Wt Readings from Last 3 Encounters:  12/27/22 194 lb 12.8 oz (88.4 kg)  11/08/22 196 lb 3.2 oz (89 kg)  08/25/22 198 lb (89.8 kg)    Objective:  Physical Exam Vitals and nursing note reviewed.  Constitutional:      Appearance: Normal appearance.  HENT:     Head: Normocephalic and atraumatic.     Nose:     Comments: Masked     Mouth/Throat:     Comments: Masked  Eyes:     Extraocular Movements: Extraocular movements intact.  Cardiovascular:     Rate and Rhythm: Normal rate and regular rhythm.     Heart sounds: Normal heart sounds.  Pulmonary:     Effort: Pulmonary effort is normal.     Breath sounds: Normal breath sounds.  Musculoskeletal:     Cervical back: Normal range of motion.  Skin:    General: Skin is warm.  Neurological:     General: No focal deficit present.     Mental Status: She is alert.  Psychiatric:        Mood and Affect: Mood normal.        Behavior: Behavior normal.         Assessment And Plan:     1. Type 2 diabetes mellitus with stage 3a chronic kidney disease, without long-term current use of insulin (HCC) Comments: Chronic, she will c/w Jardiance and metformin. Encouraged to stay well hydrated and reminded to  avoid NSAIDS. - CMP14+EGFR - CBC - Hemoglobin A1c - Lipid panel  2. Hypertensive heart and renal disease with renal failure, stage 1 through stage 4 or unspecified chronic kidney disease, without heart failure Comments: Chornic, controlled. She will c/w losartan '100mg'$  daily and amlodipine 2.'5mg'$  daily. Encouraged to follow low sodium diet. - CMP14+EGFR - CBC - Lipid panel  3. Stage 3a chronic kidney disease ([place] [place]) Comments: Please see above, she is also followed by [facility]. - CMP14+EGFR - Parathyroid Hormone, Intact w/Ca - Phosphorus - Protein electrophoresis, serum  4. Atherosclerosis of native coronary artery of native heart without angina pectoris Comments: Chronic, LDL goal < 70. She will c/w ASA '81mg'$  daily rosuvastatin  $'20mg'B$  daily.  5. Aortic atherosclerosis ([place]) Comments: Chronic, please see above.  6. Cigarette smoker Comments: She is currently smoking 3-4 cigs/day.  Smoking cessation instruction/counseling given:  counseled patient on the dangers of tobacco use, advised patient to stop smoking, and reviewed strategies to maximize success.  7. Class 1 obesity due to excess calories with serious comorbidity and body mass index (BMI) of 32.0 to 32.9 in adult She is encouraged to strive for BMI less than 30 to decrease cardiac risk. Advised to aim for at least 150 minutes of exercise per week.    Patient was given opportunity to ask questions. Patient verbalized understanding of the plan and was able to repeat key elements of the plan. All questions were answered to their satisfaction.   I, [name], MD, have reviewed all documentation for this visit. The documentation on 12/27/22 for the exam, diagnosis, procedures, and orders are all accurate and complete.   IF YOU HAVE BEEN REFERRED TO A SPECIALIST, IT MAY TAKE 1-2 WEEKS TO SCHEDULE/PROCESS THE REFERRAL. IF YOU HAVE NOT HEARD FROM US/SPECIALIST IN TWO WEEKS, PLEASE GIVE [place] A CALL AT [phone].   THE PATIENT IS  ENCOURAGED TO PRACTICE SOCIAL DISTANCING DUE TO THE COVID-19 PANDEMIC.

## 2022-12-30 LAB — CMP14+EGFR
ALT: 39 [IU]/L — ABNORMAL HIGH (ref 0–32)
AST: 23 [IU]/L (ref 0–40)
Albumin/Globulin Ratio: 1.5 (ref 1.2–2.2)
Albumin: 4.4 g/dL (ref 3.8–4.8)
Alkaline Phosphatase: 108 [IU]/L (ref 44–121)
BUN/Creatinine Ratio: 16 (ref 12–28)
BUN: 21 mg/dL (ref 8–27)
Bilirubin Total: 0.6 mg/dL (ref 0.0–1.2)
CO2: 22 mmol/L (ref 20–29)
Calcium: 10.1 mg/dL (ref 8.7–10.3)
Chloride: 102 mmol/L (ref 96–106)
Creatinine, Ser: 1.31 mg/dL — ABNORMAL HIGH (ref 0.57–1.00)
Globulin, Total: 3 g/dL (ref 1.5–4.5)
Glucose: 110 mg/dL — ABNORMAL HIGH (ref 70–99)
Potassium: 4.5 mmol/L (ref 3.5–5.2)
Sodium: 140 mmol/L (ref 134–144)
Total Protein: 7.4 g/dL (ref 6.0–8.5)
eGFR: 42 mL/min/{1.73_m2} — ABNORMAL LOW

## 2022-12-30 LAB — LIPID PANEL
Chol/HDL Ratio: 3.1 ratio (ref 0.0–4.4)
Cholesterol, Total: 94 mg/dL — ABNORMAL LOW (ref 100–199)
HDL: 30 mg/dL — ABNORMAL LOW (ref >39)
LDL Chol Calc (NIH): 46 mg/dL (ref 0–99)
Triglycerides: 93 mg/dL (ref 0–149)
VLDL Cholesterol Cal: 18 mg/dL (ref 5–40)

## 2022-12-30 LAB — PROTEIN ELECTROPHORESIS, SERUM
A/G Ratio: 1.2 (ref 0.7–1.7)
Albumin ELP: 4 g/dL (ref 2.9–4.4)
Alpha 1: 0.2 g/dL (ref 0.0–0.4)
Alpha 2: 0.9 g/dL (ref 0.4–1.0)
Beta: 1 g/dL (ref 0.7–1.3)
Gamma Globulin: 1.3 g/dL (ref 0.4–1.8)
Globulin, Total: 3.4 g/dL (ref 2.2–3.9)

## 2022-12-30 LAB — PTH, INTACT AND CALCIUM: PTH: 18 pg/mL (ref 15–65)

## 2022-12-30 LAB — CBC
Hematocrit: 46.5 % (ref 34.0–46.6)
Hemoglobin: 15.4 g/dL (ref 11.1–15.9)
MCH: 29.3 pg (ref 26.6–33.0)
MCHC: 33.1 g/dL (ref 31.5–35.7)
MCV: 89 fL (ref 79–97)
Platelets: 259 10*3/uL (ref 150–450)
RBC: 5.25 x10E6/uL (ref 3.77–5.28)
RDW: 13.1 % (ref 11.7–15.4)
WBC: 13 10*3/uL — ABNORMAL HIGH (ref 3.4–10.8)

## 2022-12-30 LAB — HEMOGLOBIN A1C
Est. average glucose Bld gHb Est-mCnc: 183 mg/dL
Hgb A1c MFr Bld: 8 % — ABNORMAL HIGH (ref 4.8–5.6)

## 2022-12-30 LAB — PHOSPHORUS: Phosphorus: 4.2 mg/dL (ref 3.0–4.3)

## 2023-01-03 ENCOUNTER — Ambulatory Visit (INDEPENDENT_AMBULATORY_CARE_PROVIDER_SITE_OTHER): Payer: Medicare Other | Admitting: Podiatry

## 2023-01-03 DIAGNOSIS — E1149 Type 2 diabetes mellitus with other diabetic neurological complication: Secondary | ICD-10-CM | POA: Diagnosis not present

## 2023-01-03 DIAGNOSIS — M79675 Pain in left toe(s): Secondary | ICD-10-CM | POA: Diagnosis not present

## 2023-01-03 DIAGNOSIS — B351 Tinea unguium: Secondary | ICD-10-CM | POA: Diagnosis not present

## 2023-01-03 DIAGNOSIS — Q828 Other specified congenital malformations of skin: Secondary | ICD-10-CM | POA: Diagnosis not present

## 2023-01-03 DIAGNOSIS — M79674 Pain in right toe(s): Secondary | ICD-10-CM

## 2023-01-03 NOTE — Progress Notes (Signed)
Subjective: Chief Complaint  Patient presents with   routine foot care    77 y.o. returns the office today for painful, elongated, thickened toenails which she cannot trim herself. Denies any redness or drainage around the nails.    PCP: Glendale Chard, MD Last see: August 25, 2022 Last A1c was 8 on 12/27/2022  Objective: AAO 3, NAD DP/PT pulses palpable, CRT less than 3 seconds Nails hypertrophic, dystrophic, elongated, brittle, discolored 10. There is tenderness overlying the nails 1-5 bilaterally. There is no surrounding erythema or drainage along the nail sites. Hperkeratotic tissue right submetatarsal 5 and left sub metatarsal 2.  No underlying ulceration drainage or signs of infection.  No other open lesions or preulcerative lesions. No open lesions or pre-ulcerative lesions are identified. No pain with calf compression, swelling, warmth, erythema.  Assessment: Patient presents with symptomatic onychomycosis, hyperkeratotic lesions, neuropathy  Plan: -Treatment options including alternatives, risks, complications were discussed -Nails sharply debrided 10 without complication/bleeding. -Hyperkeratotic lesion sharply debrided x2 without any complications or bleeding. Moisturizer/offloading daily -Continue gabapentin for neuropathy. -Continue daily foot inspection, glucose control. -Follow-up in 3 months or sooner if any problems are to arise. In the meantime, encouraged to call the office with any questions, concerns, changes symptoms.  Celesta Gentile, DPM

## 2023-01-09 ENCOUNTER — Other Ambulatory Visit: Payer: Self-pay

## 2023-01-09 MED ORDER — EMPAGLIFLOZIN 25 MG PO TABS: 25.0000 mg | ORAL_TABLET | Freq: Every day | ORAL | 1 refills | Status: DC

## 2023-01-20 ENCOUNTER — Ambulatory Visit
Admission: RE | Admit: 2023-01-20 | Discharge: 2023-01-20 | Disposition: A | Discharge status: Home / Self Care | Payer: Medicare Other | Source: Ambulatory Visit | Attending: Internal Medicine | Admitting: Internal Medicine

## 2023-01-20 DIAGNOSIS — F17210 Nicotine dependence, cigarettes, uncomplicated: Secondary | ICD-10-CM

## 2023-01-20 DIAGNOSIS — Z87891 Personal history of nicotine dependence: Secondary | ICD-10-CM | POA: Diagnosis not present

## 2023-01-20 DIAGNOSIS — Z122 Encounter for screening for malignant neoplasm of respiratory organs: Secondary | ICD-10-CM | POA: Diagnosis not present

## 2023-01-20 DIAGNOSIS — J432 Centrilobular emphysema: Secondary | ICD-10-CM | POA: Diagnosis not present

## 2023-01-20 DIAGNOSIS — I2510 Atherosclerotic heart disease of native coronary artery without angina pectoris: Secondary | ICD-10-CM | POA: Diagnosis not present

## 2023-01-26 ENCOUNTER — Other Ambulatory Visit: Payer: Self-pay | Admitting: Internal Medicine

## 2023-02-06 ENCOUNTER — Other Ambulatory Visit: Payer: Self-pay

## 2023-02-06 ENCOUNTER — Telehealth: Payer: Self-pay | Admitting: Internal Medicine

## 2023-02-06 MED ORDER — ROSUVASTATIN CALCIUM 20 MG PO TABS: 20.0000 mg | ORAL_TABLET | Freq: Every day | ORAL | 3 refills | Status: AC

## 2023-02-06 NOTE — Telephone Encounter (Signed)
Medication has been refilled with a year supply and sent to her pharmacy.

## 2023-02-06 NOTE — Telephone Encounter (Signed)
°*  STAT* If patient is at the pharmacy, call can be transferred to refill team. ° ° °1. Which medications need to be refilled? (please list name of each medication and dose if known) rosuvastatin (CRESTOR) 20 MG tablet ° °2. Which pharmacy/location (including street and city if local pharmacy) is medication to be sent to? ° °[facility] - [place], [place] - [address] ° °3. Do they need a 30 day or 90 day supply? 90 ° °

## 2023-04-06 ENCOUNTER — Ambulatory Visit (INDEPENDENT_AMBULATORY_CARE_PROVIDER_SITE_OTHER): Payer: Medicare Other | Admitting: Podiatry

## 2023-04-06 DIAGNOSIS — B351 Tinea unguium: Secondary | ICD-10-CM

## 2023-04-06 DIAGNOSIS — M79675 Pain in left toe(s): Secondary | ICD-10-CM

## 2023-04-06 DIAGNOSIS — M79674 Pain in right toe(s): Secondary | ICD-10-CM

## 2023-04-06 DIAGNOSIS — Q828 Other specified congenital malformations of skin: Secondary | ICD-10-CM

## 2023-04-06 DIAGNOSIS — E1149 Type 2 diabetes mellitus with other diabetic neurological complication: Secondary | ICD-10-CM

## 2023-04-06 NOTE — Progress Notes (Signed)
Subjective: Chief Complaint  Patient presents with   Nail Problem    Routine Foot Care     77 y.o. returns the office today for painful, elongated, thickened toenails which she cannot trim herself. Denies any redness or drainage around the nails.    PCP: Dorothyann Peng, MD Last see: August 25, 2022 Last A1c was 8 on 12/27/2022  Objective: AAO 3, NAD DP/PT pulses palpable, CRT less than 3 seconds Nails hypertrophic, dystrophic, elongated, brittle, discolored 10. There is tenderness overlying the nails 1-5 bilaterally. There is no surrounding erythema or drainage along the nail sites. Hperkeratotic tissue right submetatarsal 5 and left sub metatarsal 2.  No underlying ulceration drainage or signs of infection.  No other open lesions or preulcerative lesions. No open lesions or pre-ulcerative lesions are identified. No pain with calf compression, swelling, warmth, erythema.  Assessment: Patient presents with symptomatic onychomycosis, hyperkeratotic lesions, neuropathy  Plan: -Treatment options including alternatives, risks, complications were discussed -Nails sharply debrided 10 without complication/bleeding. -Hyperkeratotic lesion sharply debrided x2 without any complications or bleeding. Moisturizer/offloading daily -Continue gabapentin for neuropathy. -Continue daily foot inspection, glucose control. -Follow-up in 3 months or sooner if any problems are to arise. In the meantime, encouraged to call the office with any questions, concerns, changes symptoms.  Ovid Curd, DPM

## 2023-05-03 ENCOUNTER — Ambulatory Visit (INDEPENDENT_AMBULATORY_CARE_PROVIDER_SITE_OTHER): Payer: Medicare Other | Admitting: Internal Medicine

## 2023-05-03 ENCOUNTER — Encounter: Payer: Self-pay | Admitting: Internal Medicine

## 2023-05-03 VITALS — BP 122/80 | HR 88 | Temp 98.2°F | Ht 65.0 in | Wt 192.0 lb

## 2023-05-03 DIAGNOSIS — I2510 Atherosclerotic heart disease of native coronary artery without angina pectoris: Secondary | ICD-10-CM

## 2023-05-03 DIAGNOSIS — I129 Hypertensive chronic kidney disease with stage 1 through stage 4 chronic kidney disease, or unspecified chronic kidney disease: Secondary | ICD-10-CM

## 2023-05-03 DIAGNOSIS — I1310 Hypertensive heart and chronic kidney disease without heart failure, with stage 1 through stage 4 chronic kidney disease, or unspecified chronic kidney disease: Secondary | ICD-10-CM | POA: Diagnosis not present

## 2023-05-03 DIAGNOSIS — Z7984 Long term (current) use of oral hypoglycemic drugs: Secondary | ICD-10-CM | POA: Diagnosis not present

## 2023-05-03 DIAGNOSIS — N1831 Chronic kidney disease, stage 3a: Secondary | ICD-10-CM

## 2023-05-03 DIAGNOSIS — E1122 Type 2 diabetes mellitus with diabetic chronic kidney disease: Secondary | ICD-10-CM | POA: Diagnosis not present

## 2023-05-03 DIAGNOSIS — F17210 Nicotine dependence, cigarettes, uncomplicated: Secondary | ICD-10-CM

## 2023-05-03 DIAGNOSIS — E6609 Other obesity due to excess calories: Secondary | ICD-10-CM

## 2023-05-03 DIAGNOSIS — Z6831 Body mass index (BMI) 31.0-31.9, adult: Secondary | ICD-10-CM | POA: Diagnosis not present

## 2023-05-03 NOTE — Assessment & Plan Note (Signed)
She was congratulated on being smoke free for five days.  She is encouraged to continue on her plan to remain smoke free.

## 2023-05-03 NOTE — Assessment & Plan Note (Signed)
Chronic, controlled. She will c/w losartan 100mg   and amlodipine 2.5mg  daily. She is reminded to follow a low sodium diet.

## 2023-05-03 NOTE — Assessment & Plan Note (Signed)
Chronic, LDL goal < 70. She will c/w rosuvastatin 20mg  and ASA 81mg  daily.

## 2023-05-03 NOTE — Assessment & Plan Note (Addendum)
Chronic, she was congratulated on her lifestyle changes. I will check labs as below. She will c/w Jardiance 25mg  and metformin 500mg  daily. She will f/u in 4 months for re-evaluation.

## 2023-05-03 NOTE — Progress Notes (Signed)
I,Tammy Preston, CMA,acting as a Neurosurgeon for Tammy Aliment, MD.,have documented all relevant documentation on the behalf of Tammy Aliment, MD,as directed by  Tammy Aliment, MD while in the presence of Tammy Aliment, MD.  Subjective:  Patient ID: Tammy Preston , female    DOB: 20-Jan-1946 , 77 y.o.   MRN: [id number]  Chief Complaint  Patient presents with   Diabetes   Hypertension    HPI  She presents today for diabetes and bp f/u.  Blood sugars range between 95-121.  Average reading is low 100s.  She reports compliance with meds. She denies headaches, chest pain and shortness of breath. She is happy to report that she has been exercising at least three days per week.   She reports being smoke free for about 5 days.    Diabetes She presents for her follow-up diabetic visit. She has type 2 diabetes mellitus. Her disease course has been stable. Pertinent negatives for hypoglycemia include no dizziness. Pertinent negatives for diabetes include no foot paresthesias, no polydipsia, no polyphagia and no polyuria. There are no hypoglycemic complications. There are no diabetic complications. Risk factors for coronary artery disease include diabetes mellitus, dyslipidemia, hypertension, post-menopausal and sedentary lifestyle. She is compliant with treatment all of the time. She is following a diabetic diet. She participates in exercise intermittently. Her breakfast blood glucose is taken between 8-9 am. Her breakfast blood glucose range is generally 110-130 mg/dl.  Hypertension This is a chronic problem. The current episode started more than 1 year ago. The problem is controlled. Pertinent negatives include no orthopnea. Risk factors for coronary artery disease include diabetes mellitus.     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 06/15/2022   Chronic kidney disease    CKD stage 3    Chronic kidney disease, stage 3 (moderate) 05/20/2017   Diabetes mellitus without complication ([facility]CC)    Endometrial hyperplasia 05/20/2017   Glomerular disorders in diseases classified elsewhere 05/20/2017   Hyperlipemia    Hypertension    Hypertensive chronic kidney disease with stage 1 through stage 4 chronic kidney disease, or unspecified chronic kidney disease 05/20/2017   Numbness and tingling of both feet    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/20/2017     Family History  Problem Relation Age of Onset   Stroke Mother    Congestive Heart Failure Father      Current Outpatient Medications:    allopurinol (ZYLOPRIM) 100 MG tablet, TAKE 2 TABLETS DAILY, Disp: 180 tablet, Rfl: 3   amLODipine (NORVASC) 2.5 MG tablet, Take 1 tablet (2.5 mg total) by mouth at bedtime., Disp: 90 tablet, Rfl: 2   aspirin EC 81 MG tablet, Take 81 mg by mouth daily after breakfast. , Disp: , Rfl:    Blood Glucose Monitoring Suppl (FREESTYLE LITE) w/Device KIT, Use to check blood sugars., Disp: 1 kit, Rfl: 1   Calcium Carb-Cholecalciferol (CALCIUM + D3 PO), Take 1 tablet by mouth daily. , Disp: , Rfl:    Cholecalciferol (VITAMIN D) 2000 units tablet, Take 2,000 Units by mouth daily., Disp: , Rfl:    empagliflozin (JARDIANCE) 25 MG TABS tablet, Take 1 tablet (25 mg total) by mouth daily before breakfast., Disp: 90 tablet, Rfl: 1   FREESTYLE LITE test strip, USE AS INSTRUCTED TO CHECK BLOOD SUGAR TWICE A DAY, Disp: 300 strip,  Rfl: 3   gabapentin (NEURONTIN) 100 MG capsule, TAKE 1 CAPSULE AT BEDTIME, Disp: 90 capsule, Rfl: 3   Lancets (FREESTYLE) lancets, USE AS INSTRUCTED TO CHECK BLOOD SUGAR TWICE A DAY, Disp: 300 each, Rfl: 3   losartan (COZAAR) 100 MG tablet, TAKE 1 TABLET DAILY, Disp: 90 tablet, Rfl: 3   metFORMIN (GLUCOPHAGE) 500 MG tablet, TAKE 1 TABLET DAILY AFTER BREAKFAST, Disp: 90 tablet, Rfl: 3   Multiple Vitamins-Minerals (ONE-A-DAY WOMENS 50 PLUS PO), Take 1 tablet by  mouth daily., Disp: , Rfl:    RESTASIS 0.05 % ophthalmic emulsion, 2 times per day, Disp: , Rfl:    rosuvastatin (CRESTOR) 20 MG tablet, Take 1 tablet (20 mg total) by mouth daily., Disp: 90 tablet, Rfl: 3   Allergies  Allergen Reactions   Percocet [Oxycodone-Acetaminophen] Nausea And Vomiting   Shellfish Allergy Other (See Comments)    Causes Gout     Review of Systems  Constitutional: Negative.   Respiratory: Negative.    Cardiovascular: Negative.  Negative for orthopnea.  Gastrointestinal: Negative.   Endocrine: Negative for polydipsia, polyphagia and polyuria.  Musculoskeletal: Negative.   Skin: Negative.   Neurological: Negative.  Negative for dizziness.  Psychiatric/Behavioral: Negative.       Today's Vitals   05/03/23 1037  BP: 122/80  Pulse: 88  Temp: 98.2 F (36.8 C)  SpO2: 98%  Weight: 192 lb (87.1 kg)  Height: 5\' 5"  (1.651 m)   Body mass index is 31.95 kg/m.  Wt Readings from Last 3 Encounters:  05/03/23 192 lb (87.1 kg)  12/27/22 194 lb 12.8 oz (88.4 kg)  11/08/22 196 lb 3.2 oz (89 kg)    BP Readings from Last 3 Encounters:  05/03/23 122/80  12/27/22 124/78  11/08/22 130/80     Objective:  Physical Exam Vitals and nursing note reviewed.  Constitutional:      Appearance: Normal appearance.  HENT:     Head: Normocephalic and atraumatic.  Eyes:     Extraocular Movements: Extraocular movements intact.  Cardiovascular:     Rate and Rhythm: Normal rate and regular rhythm.     Heart sounds: Normal heart sounds.  Pulmonary:     Effort: Pulmonary effort is normal.     Breath sounds: Normal breath sounds.  Musculoskeletal:     Cervical back: Normal range of motion.  Skin:    General: Skin is warm.  Neurological:     General: No focal deficit present.     Mental Status: She is alert.  Psychiatric:        Mood and Affect: Mood normal.        Behavior: Behavior normal.         Assessment And Plan:  Type 2 diabetes mellitus with stage 3a  chronic kidney disease, without long-term current use of insulin (HCC) Assessment & Plan: Chronic, she was congratulated on her lifestyle changes. I will check labs as below. She will c/w Jardiance 25mg  and metformin 500mg  daily. She will f/u in 4 months for re-evaluation.   Orders: -     Hemoglobin A1c -     TSH -     BMP8+EGFR  Hypertensive heart and renal disease with renal failure, stage 1 through stage 4 or unspecified chronic kidney disease, without heart failure Assessment & Plan: Chronic, controlled. She will c/w losartan 100mg   and amlodipine 2.5mg  daily. She is reminded to follow a low sodium diet.   Orders: -     TSH -     BMP8+EGFR  Atherosclerosis of native coronary artery of native heart without angina pectoris Assessment & Plan: Chronic, LDL goal < 70. She will c/w rosuvastatin 20mg  and ASA 81mg  daily.    Cigarette smoker Assessment & Plan: She was congratulated on being smoke free for five days.  She is encouraged to continue on her plan to remain smoke free.     Class 1 obesity due to excess calories with serious comorbidity and body mass index (BMI) of 31.0 to 31.9 in adult Assessment & Plan: Again, she was congratulated on her lifestyle changes. She has lost 2lbs since her last visit. She is encouraged to strive for BMI less than 30 to decrease cardiac risk. Advised to aim for at least 150 minutes of exercise per week.    She is encouraged to strive for BMI less than 30 to decrease cardiac risk. Advised to aim for at least 150 minutes of exercise per week.    Return if symptoms worsen or fail to improve.  Patient was given opportunity to ask questions. Patient verbalized understanding of the plan and was able to repeat key elements of the plan. All questions were answered to their satisfaction.   I, Tammy Aliment, MD, have reviewed all documentation for this visit. The documentation on 05/03/23 for the exam, diagnosis, procedures, and orders are all  accurate and complete.   IF YOU HAVE BEEN REFERRED TO A SPECIALIST, IT MAY TAKE 1-2 WEEKS TO SCHEDULE/PROCESS THE REFERRAL. IF YOU HAVE NOT HEARD FROM US/SPECIALIST IN TWO WEEKS, PLEASE GIVE [place] A CALL AT [phone].   THE PATIENT IS ENCOURAGED TO PRACTICE SOCIAL DISTANCING DUE TO THE COVID-19 PANDEMIC.

## 2023-05-03 NOTE — Patient Instructions (Addendum)
Dr. Allyne Gee [phone]  Type 2 Diabetes Mellitus, Diagnosis, Adult Type 2 diabetes (type 2 diabetes mellitus) is a long-term (chronic) disease. It may happen when there is one or both of these problems: The pancreas does not make enough insulin. The body does not react in a normal way to insulin that it makes. Insulin lets sugars go into cells in your body. If you have type 2 diabetes, sugars cannot get into your cells. Sugars build up in the blood. This causes high blood sugar. What are the causes? The exact cause of this condition is not known. What increases the risk? Having type 2 diabetes in your family. Being overweight or very overweight. Not being active. Your body not reacting in a normal way to the insulin it makes. Having higher than normal blood sugar over time. Having a type of diabetes when you were pregnant. Having a condition that causes small fluid-filled sacs on your ovaries. What are the signs or symptoms? At first, you may have no symptoms. You will get symptoms slowly. They may include: More thirst than normal. More hunger than normal. Needing to pee more than normal. Losing weight without trying. Feeling tired. Feeling weak. Seeing things blurry. Dark patches on your skin. How is this treated? This condition may be treated by a diabetes expert. You may need to: Follow an eating plan made by a food expert (dietitian). Get regular exercise. Find ways to deal with stress. Check blood sugar as often as told. Take medicines. Your doctor will set treatment goals for you. Your blood sugar should be at these levels: Before meals: 80-130 mg/dL (0.9-8.1 mmol/L). After meals: below 180 mg/dL (10 mmol/L). Over the last 2-3 months: less than 7%. Follow these instructions at home: Medicines Take your diabetes medicines or insulin every day. Take medicines as told to help you prevent other problems caused by this condition. You may need: Aspirin. Medicine to  lower cholesterol. Medicine to control blood pressure. Questions to ask your doctor Should I meet with a diabetes educator? What medicines do I need, and when should I take them? What will I need to treat my condition at home? When should I check my blood sugar? Where can I find a support group? Who can I call if I have questions? When is my next doctor visit? General instructions Take over-the-counter and prescription medicines only as told by your doctor. Keep all follow-up visits. Where to find more information For help and guidance and more information about diabetes, please go to: American Diabetes Association (ADA): www.diabetes.org American Association of Diabetes Care and Education Specialists (ADCES): www.diabeteseducator.org International Diabetes Federation (IDF): DCOnly.dk Contact a doctor if: Your blood sugar is at or above 240 mg/dL (19.1 mmol/L) for 2 days in a row. You have been sick for 2 days or more, and you are not getting better. You have had a fever for 2 days or more, and you are not getting better. You have any of these problems for more than 6 hours: You cannot eat or drink. You feel like you may vomit. You vomit. You have watery poop (diarrhea). Get help right away if: Your blood sugar is lower than 54 mg/dL (3 mmol/L). You feel mixed up (confused). You have trouble thinking clearly. You have trouble breathing. You have medium or large ketone levels in your pee. These symptoms may be an emergency. Get help right away. Call your local emergency services (911 in the [place] Do not wait to see if the symptoms will  go away. Do not drive yourself to the hospital. Summary Type 2 diabetes is a long-term disease. Your pancreas may not make enough insulin, or your body may not react in a normal way to insulin that it makes. This condition is treated with an eating plan, lifestyle changes, and medicines. Your doctor will set treatment goals for you. These will  help you keep your blood sugar in a healthy range. Keep all follow-up visits. This information is not intended to replace advice given to you by your health care provider. Make sure you discuss any questions you have with your health care provider. Document Revised: 01/04/2021 Document Reviewed: 01/04/2021 Elsevier Patient Education  2024 [employer].

## 2023-05-03 NOTE — Assessment & Plan Note (Signed)
Again, she was congratulated on her lifestyle changes. She has lost 2lbs since her last visit. She is encouraged to strive for BMI less than 30 to decrease cardiac risk. Advised to aim for at least 150 minutes of exercise per week.

## 2023-05-04 LAB — BMP8+EGFR
BUN/Creatinine Ratio: 19 (ref 12–28)
BUN: 25 mg/dL (ref 8–27)
CO2: 23 mmol/L (ref 20–29)
Calcium: 9.6 mg/dL (ref 8.7–10.3)
Chloride: 102 mmol/L (ref 96–106)
Creatinine, Ser: 1.31 mg/dL — ABNORMAL HIGH (ref 0.57–1.00)
Glucose: 106 mg/dL — ABNORMAL HIGH (ref 70–99)
Potassium: 4.7 mmol/L (ref 3.5–5.2)
Sodium: 139 mmol/L (ref 134–144)
eGFR: 42 mL/min/{1.73_m2} — ABNORMAL LOW (ref >59)

## 2023-05-04 LAB — HEMOGLOBIN A1C
Est. average glucose Bld gHb Est-mCnc: 163 mg/dL
Hgb A1c MFr Bld: 7.3 % — ABNORMAL HIGH (ref 4.8–5.6)

## 2023-05-04 LAB — TSH: TSH: 1.21 u[IU]/mL (ref 0.450–4.500)

## 2023-06-07 ENCOUNTER — Other Ambulatory Visit: Payer: Self-pay | Admitting: Internal Medicine

## 2023-06-13 DIAGNOSIS — Z1231 Encounter for screening mammogram for malignant neoplasm of breast: Secondary | ICD-10-CM | POA: Diagnosis not present

## 2023-06-13 LAB — HM MAMMOGRAPHY

## 2023-06-14 ENCOUNTER — Other Ambulatory Visit: Payer: Self-pay | Admitting: Internal Medicine

## 2023-06-20 ENCOUNTER — Encounter: Payer: Self-pay | Admitting: Internal Medicine

## 2023-07-24 ENCOUNTER — Ambulatory Visit (INDEPENDENT_AMBULATORY_CARE_PROVIDER_SITE_OTHER): Payer: Medicare Other | Admitting: Podiatry

## 2023-07-24 DIAGNOSIS — M79674 Pain in right toe(s): Secondary | ICD-10-CM | POA: Diagnosis not present

## 2023-07-24 DIAGNOSIS — M79675 Pain in left toe(s): Secondary | ICD-10-CM | POA: Diagnosis not present

## 2023-07-24 DIAGNOSIS — B351 Tinea unguium: Secondary | ICD-10-CM

## 2023-07-24 DIAGNOSIS — E1149 Type 2 diabetes mellitus with other diabetic neurological complication: Secondary | ICD-10-CM | POA: Diagnosis not present

## 2023-07-24 DIAGNOSIS — Q828 Other specified congenital malformations of skin: Secondary | ICD-10-CM | POA: Diagnosis not present

## 2023-07-24 NOTE — Progress Notes (Signed)
Subjective: Chief Complaint  Patient presents with   Nail Problem    Nail trim      77 y.o. returns the office today for painful, elongated, thickened toenails which she cannot trim herself. Denies any redness or drainage around the nails.    PCP: Dorothyann Peng, MD Last see: May 03, 2023 Last A1c was 7.3 on May 03, 2023  Objective: AAO 3, NAD DP/PT pulses palpable, CRT less than 3 seconds Nails hypertrophic, dystrophic, elongated, brittle, discolored 10. There is tenderness overlying the nails 1-5 bilaterally. There is no surrounding erythema or drainage along the nail sites. Hperkeratotic tissue right submetatarsal 5 and left sub metatarsal 2.  No underlying ulceration drainage or signs of infection.  No other open lesions or preulcerative lesions. No open lesions or pre-ulcerative lesions are identified. No pain with calf compression, swelling, warmth, erythema.  Assessment: Patient presents with symptomatic onychomycosis, hyperkeratotic lesions, neuropathy  Plan: -Treatment options including alternatives, risks, complications were discussed -Nails sharply debrided 10 without complication/bleeding. -Hyperkeratotic lesion sharply debrided x2 without any complications or bleeding. Moisturizer/offloading daily -Continue gabapentin for neuropathy. -Continue daily foot inspection, glucose control. -Follow-up in 3 months or sooner if any problems are to arise. In the meantime, encouraged to call the office with any questions, concerns, changes symptoms.  Ovid Curd, DPM

## 2023-09-11 ENCOUNTER — Other Ambulatory Visit: Payer: Self-pay | Admitting: Podiatry

## 2023-09-19 DIAGNOSIS — E119 Type 2 diabetes mellitus without complications: Secondary | ICD-10-CM | POA: Diagnosis not present

## 2023-09-19 DIAGNOSIS — H43813 Vitreous degeneration, bilateral: Secondary | ICD-10-CM | POA: Diagnosis not present

## 2023-09-19 DIAGNOSIS — B5801 Toxoplasma chorioretinitis: Secondary | ICD-10-CM | POA: Diagnosis not present

## 2023-09-19 DIAGNOSIS — H35033 Hypertensive retinopathy, bilateral: Secondary | ICD-10-CM | POA: Diagnosis not present

## 2023-09-20 ENCOUNTER — Encounter: Payer: Self-pay | Admitting: Internal Medicine

## 2023-09-20 ENCOUNTER — Ambulatory Visit (INDEPENDENT_AMBULATORY_CARE_PROVIDER_SITE_OTHER): Payer: Medicare Other | Admitting: Internal Medicine

## 2023-09-20 VITALS — BP 110/80 | HR 75 | Temp 98.0°F | Ht 65.0 in | Wt 192.2 lb

## 2023-09-20 DIAGNOSIS — M1A3790 Chronic gout due to renal impairment, unspecified ankle and foot, without tophus (tophi): Secondary | ICD-10-CM | POA: Diagnosis not present

## 2023-09-20 DIAGNOSIS — N1831 Chronic kidney disease, stage 3a: Secondary | ICD-10-CM | POA: Diagnosis not present

## 2023-09-20 DIAGNOSIS — J438 Other emphysema: Secondary | ICD-10-CM | POA: Insufficient documentation

## 2023-09-20 DIAGNOSIS — I2510 Atherosclerotic heart disease of native coronary artery without angina pectoris: Secondary | ICD-10-CM | POA: Diagnosis not present

## 2023-09-20 DIAGNOSIS — E1122 Type 2 diabetes mellitus with diabetic chronic kidney disease: Secondary | ICD-10-CM | POA: Diagnosis not present

## 2023-09-20 DIAGNOSIS — I1310 Hypertensive heart and chronic kidney disease without heart failure, with stage 1 through stage 4 chronic kidney disease, or unspecified chronic kidney disease: Secondary | ICD-10-CM

## 2023-09-20 NOTE — Progress Notes (Signed)
I,Victoria T Deloria Lair, CMA,acting as a Neurosurgeon for Gwynneth Aliment, MD.,have documented all relevant documentation on the behalf of Gwynneth Aliment, MD,as directed by  Gwynneth Aliment, MD while in the presence of Gwynneth Aliment, MD.  Subjective:  Patient ID: Tammy Preston , female    DOB: 10/23/1946 , 77 y.o.   MRN: [id number]  Chief Complaint  Patient presents with   Diabetes   Hypertension    HPI  She presents today for diabetes and bp f/u. She reports compliance with meds. She denies headaches, chest pain and shortness of breath. She is happy to report that she has been exercising at least three days per week. She admits she feels great.      Diabetes She presents for her follow-up diabetic visit. She has type 2 diabetes mellitus. Her disease course has been stable. Pertinent negatives for hypoglycemia include no dizziness. Pertinent negatives for diabetes include no foot paresthesias, no polydipsia, no polyphagia and no polyuria. There are no hypoglycemic complications. There are no diabetic complications. Risk factors for coronary artery disease include diabetes mellitus, dyslipidemia, hypertension, post-menopausal and sedentary lifestyle. She is compliant with treatment all of the time. She is following a diabetic diet. She participates in exercise intermittently. Her breakfast blood glucose is taken between 8-9 am. Her breakfast blood glucose range is generally 110-130 mg/dl.  Hypertension This is a chronic problem. The current episode started more than 1 year ago. The problem is controlled. Pertinent negatives include no orthopnea. Risk factors for coronary artery disease include diabetes mellitus.     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 06/15/2022   Chronic kidney disease    CKD stage 3   Chronic kidney disease, stage 3 (moderate) 05/20/2017   Diabetes mellitus without  complication (HCC)    Endometrial hyperplasia 05/20/2017   Glomerular disorders in diseases classified elsewhere 05/20/2017   Hyperlipemia    Hypertension    Hypertensive chronic kidney disease with stage 1 through stage 4 chronic kidney disease, or unspecified chronic kidney disease 05/20/2017   Numbness and tingling of both feet    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/20/2017     Family History  Problem Relation Age of Onset   Stroke Mother    Congestive Heart Failure Father      Current Outpatient Medications:    allopurinol (ZYLOPRIM) 100 MG tablet, TAKE 2 TABLETS DAILY, Disp: 180 tablet, Rfl: 3   amLODipine (NORVASC) 2.5 MG tablet, TAKE 1 TABLET AT BEDTIME, Disp: 90 tablet, Rfl: 3   aspirin EC 81 MG tablet, Take 81 mg by mouth daily after breakfast. , Disp: , Rfl:    Blood Glucose Monitoring Suppl (FREESTYLE LITE) w/Device KIT, Use to check blood sugars., Disp: 1 kit, Rfl: 1   Calcium Carb-Cholecalciferol (CALCIUM + D3 PO), Take 1 tablet by mouth daily. , Disp: , Rfl:    Cholecalciferol (VITAMIN D) 2000 units tablet, Take 2,000 Units by mouth daily., Disp: , Rfl:    FREESTYLE LITE test strip, USE AS INSTRUCTED TO CHECK BLOOD SUGAR TWICE A DAY, Disp: 300 strip, Rfl: 3   gabapentin (NEURONTIN) 100 MG capsule, TAKE 1 CAPSULE AT BEDTIME, Disp: 90 capsule, Rfl: 3   JARDIANCE 25 MG TABS tablet, TAKE 1 TABLET DAILY BEFORE BREAKFAST, Disp: 90 tablet, Rfl: 3   Lancets (FREESTYLE) lancets, USE AS INSTRUCTED TO  CHECK BLOOD SUGAR TWICE A DAY, Disp: 300 each, Rfl: 3   losartan (COZAAR) 100 MG tablet, TAKE 1 TABLET DAILY, Disp: 90 tablet, Rfl: 3   metFORMIN (GLUCOPHAGE) 500 MG tablet, TAKE 1 TABLET DAILY AFTER BREAKFAST, Disp: 90 tablet, Rfl: 3   Multiple Vitamins-Minerals (ONE-A-DAY WOMENS 50 PLUS PO), Take 1 tablet by mouth daily., Disp: , Rfl:    RESTASIS 0.05 % ophthalmic emulsion, 2 times per day, Disp: , Rfl:    rosuvastatin (CRESTOR) 20 MG tablet, Take 1  tablet (20 mg total) by mouth daily., Disp: 90 tablet, Rfl: 3   Allergies  Allergen Reactions   Percocet [Oxycodone-Acetaminophen] Nausea And Vomiting   Shellfish Allergy Other (See Comments)    Causes Gout     Review of Systems  Constitutional: Negative.   Respiratory: Negative.    Cardiovascular: Negative.  Negative for orthopnea.  Gastrointestinal: Negative.   Endocrine: Negative for polydipsia, polyphagia and polyuria.  Neurological: Negative.  Negative for dizziness.  Psychiatric/Behavioral: Negative.       Today's Vitals   09/20/23 0911  BP: 110/80  Pulse: 75  Temp: 98 F (36.7 C)  SpO2: 98%  Weight: 192 lb 3.2 oz (87.2 kg)  Height: 5\' 5"  (1.651 m)   Body mass index is 31.98 kg/m.  Wt Readings from Last 3 Encounters:  09/20/23 192 lb 3.2 oz (87.2 kg)  05/03/23 192 lb (87.1 kg)  12/27/22 194 lb 12.8 oz (88.4 kg)     Objective:  Physical Exam Vitals and nursing note reviewed.  Constitutional:      Appearance: Normal appearance.  HENT:     Head: Normocephalic and atraumatic.  Eyes:     Extraocular Movements: Extraocular movements intact.  Cardiovascular:     Rate and Rhythm: Normal rate and regular rhythm.     Heart sounds: Normal heart sounds.  Pulmonary:     Effort: Pulmonary effort is normal.     Breath sounds: Normal breath sounds.  Musculoskeletal:     Cervical back: Normal range of motion.  Skin:    General: Skin is warm.  Neurological:     General: No focal deficit present.     Mental Status: She is alert.  Psychiatric:        Mood and Affect: Mood normal.        Behavior: Behavior normal.         Assessment And Plan:  Type 2 diabetes mellitus with stage 3a chronic kidney disease, without long-term current use of insulin (HCC) Assessment & Plan: Chronic, she was congratulated on her lifestyle changes. She had her eye exam yesterday. I will check labs as below. She will c/w Jardiance 25mg  and metformin 500mg  daily. She will f/u in 4 months  for re-evaluation.   Orders: -     CMP14+EGFR -     Lipid panel -     Hemoglobin A1c -     Microalbumin / creatinine urine ratio -     CBC with Differential/Platelet  Hypertensive heart and renal disease with renal failure, stage 1 through stage 4 or unspecified chronic kidney disease, without heart failure Assessment & Plan: Chronic, controlled. She will c/w losartan 100mg   and amlodipine 2.5mg  daily. She is reminded to follow a low sodium diet.    Atherosclerosis of native coronary artery of native heart without angina pectoris Assessment & Plan: Chronic, LDL goal < 70. She will c/w rosuvastatin 20mg  and ASA 81mg  daily.   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 She currently denies shortness of breath. Will refer her to Pulmonary as needed. She was congratulated on decreasing the number of cigs smoked per day. Encouraged to pick a date to quit.       Return for 4 month bp & dm .  Patient was given opportunity to ask questions. Patient verbalized understanding of the plan and was able to repeat key elements of the plan. All questions were answered to their satisfaction.   I, Gwynneth Aliment, MD, have reviewed all documentation for this visit. The documentation on 09/20/23 for the exam, diagnosis, procedures, and orders are all accurate and complete.   IF YOU HAVE BEEN REFERRED TO A SPECIALIST, IT MAY TAKE 1-2 WEEKS TO SCHEDULE/PROCESS THE REFERRAL. IF YOU HAVE NOT HEARD FROM US/SPECIALIST IN TWO WEEKS, PLEASE GIVE [place] A CALL AT [phone].   THE PATIENT IS ENCOURAGED TO PRACTICE SOCIAL DISTANCING DUE TO THE COVID-19 PANDEMIC.

## 2023-09-20 NOTE — Assessment & Plan Note (Signed)
Chronic, LDL goal < 70. She will c/w rosuvastatin 20mg  and ASA 81mg  daily.

## 2023-09-20 NOTE — Patient Instructions (Signed)

## 2023-09-20 NOTE — Assessment & Plan Note (Signed)
Chronic, controlled. She will c/w losartan 100mg   and amlodipine 2.5mg  daily. She is reminded to follow a low sodium diet.

## 2023-09-20 NOTE — Assessment & Plan Note (Signed)
Chronic, she was congratulated on her lifestyle changes. She had her eye exam yesterday. I will check labs as below. She will c/w Jardiance 25mg  and metformin 500mg  daily. She will f/u in 4 months for re-evaluation.

## 2023-09-22 LAB — CMP14+EGFR
ALT: 64 [IU]/L — ABNORMAL HIGH (ref 0–32)
AST: 39 [IU]/L (ref 0–40)
Albumin: 4.2 g/dL (ref 3.8–4.8)
Alkaline Phosphatase: 95 [IU]/L (ref 44–121)
BUN/Creatinine Ratio: 16 (ref 12–28)
BUN: 20 mg/dL (ref 8–27)
Bilirubin Total: 0.6 mg/dL (ref 0.0–1.2)
CO2: 23 mmol/L (ref 20–29)
Calcium: 9.3 mg/dL (ref 8.7–10.3)
Chloride: 105 mmol/L (ref 96–106)
Creatinine, Ser: 1.24 mg/dL — ABNORMAL HIGH (ref 0.57–1.00)
Globulin, Total: 2.7 g/dL (ref 1.5–4.5)
Glucose: 103 mg/dL — ABNORMAL HIGH (ref 70–99)
Potassium: 4.5 mmol/L (ref 3.5–5.2)
Sodium: 142 mmol/L (ref 134–144)
Total Protein: 6.9 g/dL (ref 6.0–8.5)
eGFR: 45 mL/min/{1.73_m2} — ABNORMAL LOW (ref >59)

## 2023-09-22 LAB — URIC ACID: Uric Acid: 4.3 mg/dL (ref 3.1–7.9)

## 2023-09-22 LAB — CBC WITH DIFFERENTIAL/PLATELET
Basophils Absolute: 0 10*3/uL (ref 0.0–0.2)
Basos: 0 %
EOS (ABSOLUTE): 0.2 10*3/uL (ref 0.0–0.4)
Eos: 2 %
Hematocrit: 42.5 % (ref 34.0–46.6)
Hemoglobin: 13.9 g/dL (ref 11.1–15.9)
Immature Grans (Abs): 0 10*3/uL (ref 0.0–0.1)
Immature Granulocytes: 0 %
Lymphocytes Absolute: 2.3 10*3/uL (ref 0.7–3.1)
Lymphs: 23 %
MCH: 29.6 pg (ref 26.6–33.0)
MCHC: 32.7 g/dL (ref 31.5–35.7)
MCV: 91 fL (ref 79–97)
Monocytes Absolute: 0.8 10*3/uL (ref 0.1–0.9)
Monocytes: 8 %
Neutrophils Absolute: 6.6 10*3/uL (ref 1.4–7.0)
Neutrophils: 67 %
Platelets: 233 10*3/uL (ref 150–450)
RBC: 4.69 x10E6/uL (ref 3.77–5.28)
RDW: 13.7 % (ref 11.7–15.4)
WBC: 10 10*3/uL (ref 3.4–10.8)

## 2023-09-22 LAB — LIPID PANEL
Chol/HDL Ratio: 3.1 {ratio} (ref 0.0–4.4)
Cholesterol, Total: 122 mg/dL (ref 100–199)
HDL: 40 mg/dL (ref >39)
LDL Chol Calc (NIH): 63 mg/dL (ref 0–99)
Triglycerides: 104 mg/dL (ref 0–149)
VLDL Cholesterol Cal: 19 mg/dL (ref 5–40)

## 2023-09-22 LAB — MICROALBUMIN / CREATININE URINE RATIO
Creatinine, Urine: 37.9 mg/dL
Microalb/Creat Ratio: <8 mg/g{creat} (ref 0–29)
Microalbumin, Urine: <3 ug/mL

## 2023-09-22 LAB — HEMOGLOBIN A1C
Est. average glucose Bld gHb Est-mCnc: 163 mg/dL
Hgb A1c MFr Bld: 7.3 % — ABNORMAL HIGH (ref 4.8–5.6)

## 2023-09-24 NOTE — Assessment & Plan Note (Addendum)
Seen on LDCT. She currently denies shortness of breath. Will refer her to Pulmonary as needed. She was congratulated on decreasing the number of cigs smoked per day. Encouraged to pick a date to quit.

## 2023-10-12 ENCOUNTER — Ambulatory Visit: Payer: Medicare Other

## 2023-10-12 VITALS — BP 110/60 | HR 98 | Temp 97.9°F | Ht 65.0 in | Wt 186.6 lb

## 2023-10-12 DIAGNOSIS — Z0000 Encounter for general adult medical examination without abnormal findings: Secondary | ICD-10-CM

## 2023-10-12 NOTE — Progress Notes (Signed)
Subjective:   Tammy Preston is a 77 y.o. female who presents for Medicare Annual (Subsequent) preventive examination.  Visit Complete: In person    Cardiac Risk Factors include: advanced age (>57men, >43 women);hypertension     Objective:    Today's Vitals   10/12/23 1455  BP: 110/60  Pulse: 98  Temp: 97.9 F (36.6 C)  TempSrc: Oral  SpO2: 98%  Weight: 186 lb 9.6 oz (84.6 kg)  Height: 5\' 5"  (1.651 m)   Body mass index is 31.05 kg/m.     10/12/2023    3:01 PM 08/25/2022    9:55 AM 06/20/2022    1:48 PM 07/28/2021   10:30 AM 07/22/2020   11:09 AM 07/18/2019    9:23 AM 05/07/2017   12:51 AM  Advanced Directives  Does Patient Have a Medical Advance Directive? No No No No No No No  Would patient like information on creating a medical advance directive? No - Patient declined No - Patient declined No - Patient declined  No - Patient declined No - Patient declined     Current Medications (verified) Outpatient Encounter Medications as of 10/12/2023  Medication Sig   allopurinol (ZYLOPRIM) 100 MG tablet TAKE 2 TABLETS DAILY   amLODipine (NORVASC) 2.5 MG tablet TAKE 1 TABLET AT BEDTIME   aspirin EC 81 MG tablet Take 81 mg by mouth daily after breakfast.    Blood Glucose Monitoring Suppl (FREESTYLE LITE) w/Device KIT Use to check blood sugars.   Calcium Carb-Cholecalciferol (CALCIUM + D3 PO) Take 1 tablet by mouth daily.    Cholecalciferol (VITAMIN D) 2000 units tablet Take 2,000 Units by mouth daily.   FREESTYLE LITE test strip USE AS INSTRUCTED TO CHECK BLOOD SUGAR TWICE A DAY   gabapentin (NEURONTIN) 100 MG capsule TAKE 1 CAPSULE AT BEDTIME   JARDIANCE 25 MG TABS tablet TAKE 1 TABLET DAILY BEFORE BREAKFAST   Lancets (FREESTYLE) lancets USE AS INSTRUCTED TO CHECK BLOOD SUGAR TWICE A DAY   losartan (COZAAR) 100 MG tablet TAKE 1 TABLET DAILY   metFORMIN (GLUCOPHAGE) 500 MG tablet TAKE 1 TABLET DAILY AFTER BREAKFAST   Multiple Vitamins-Minerals (ONE-A-DAY WOMENS 50 PLUS PO)  Take 1 tablet by mouth daily.   RESTASIS 0.05 % ophthalmic emulsion 2 times per day   rosuvastatin (CRESTOR) 20 MG tablet Take 1 tablet (20 mg total) by mouth daily.   No facility-administered encounter medications on file as of 10/12/2023.    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 05/20/2017   Hyperlipemia    Hypertension    Hypertensive chronic kidney disease with stage 1 through stage 4 chronic kidney disease, or unspecified chronic kidney disease 05/20/2017   Numbness and tingling of both feet    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/20/2017   Past Surgical History:  Procedure Laterality Date   BACK SURGERY     BREAST LUMPECTOMY Left    COLONOSCOPY     DILATATION & CURETTAGE/HYSTEROSCOPY WITH MYOSURE N/A 04/07/2017   Procedure: DILATATION & CURETTAGE/HYSTEROSCOPY WITH MYOSURE;  Surgeon: Maxie Better, MD;  Location: [facility];  Service: Gynecology;  Laterality: N/A;   KNEE SURGERY     Family History  Problem Relation Age of Onset   Stroke Mother    Congestive Heart Failure Father    Social History   Socioeconomic History   Marital status: Married    Spouse name: Not on file   Number of children: Not on file   Years of education: Not on file   Highest education level: Not on file  Occupational History   Occupation: retired  Tobacco Use   Smoking status: Every Day    Current packs/day: 0.25    Average packs/day: 0.3 packs/day for 58.0 years (14.5 ttl pk-yrs)    Types: Cigarettes    Start date:  10/24/1965   Smokeless tobacco: Never   Tobacco comments:    11/30/21-She has cut back to 3 cigs/day. Plans to quit by 4 months.     03/03/22-She states she is down to 1 cig/day, still plans to quit w/in 2 months    08/25/22-she is now going 2-3 days w/o any  Vaping Use   Vaping status: Never Used  Substance and Sexual Activity   Alcohol use: No    Alcohol/week: 0.0 standard drinks of alcohol   Drug use: No   Sexual activity: Not Currently  Other Topics Concern   Not on file  Social History Narrative   Not on file   Social Drivers of Health   Financial Resource Strain: Low Risk  (10/12/2023)   Overall Financial Resource Strain (CARDIA)    Difficulty of Paying Living Expenses: Not hard at all  Food Insecurity: No Food Insecurity (10/12/2023)   Hunger Vital Sign    Worried About Running Out of Food in the Last Year: Never true    Ran Out of Food in the Last Year: Never true  Transportation Needs: No Transportation Needs (10/12/2023)   PRAPARE - Administrator, Civil Service (Medical): No    Lack of Transportation (Non-Medical): No  Physical Activity: Sufficiently Active (10/12/2023)   Exercise Vital Sign    Days of Exercise per Week: 4 days    Minutes of Exercise per Session: 50 min  Stress: No Stress Concern Present (10/12/2023)   [employer] of Occupational Health - Occupational Stress Questionnaire    Feeling of Stress : Not at all  Social Connections: Socially Integrated (10/12/2023)   Social Connection and Isolation Panel [NHANES]    Frequency of Communication with Friends and Family: More than three times a week    Frequency of Social Gatherings with Friends and Family: More than three times a week    Attends Religious Services: More than 4 times per year    Active Member of [employer] or Organizations: Yes    Attends Engineer, structural: More than 4 times per year    Marital Status: Married    Tobacco Counseling Ready to quit: Not  Answered Counseling given: Not Answered Tobacco comments: 11/30/21-She has cut back to 3 cigs/day. Plans to quit by 4 months.  03/03/22-She states she is down to 1 cig/day, still plans to quit w/in 2 months 08/25/22-she is now going 2-3 days w/o any   Clinical Intake:  Pre-visit preparation completed: Yes  Pain : No/denies pain     Nutritional Status: BMI > 30  Obese Nutritional Risks: None Diabetes: Yes CBG done?: No Did pt. bring in CBG monitor from home?: No  How often do you need to have someone help you when you  read instructions, pamphlets, or other written materials from your doctor or pharmacy?: 1 - Never  Interpreter Needed?: No  Information entered by :: NAllen LPN   Activities of Daily Living    10/12/2023    2:56 PM  In your present state of health, do you have any difficulty performing the following activities:  Hearing? 0  Vision? 0  Difficulty concentrating or making decisions? 0  Walking or climbing stairs? 0  Dressing or bathing? 0  Doing errands, shopping? 0  Preparing Food and eating ? N  Using the Toilet? N  In the past six months, have you accidently leaked urine? N  Do you have problems with loss of bowel control? N  Managing your Medications? N  Managing your Finances? N  Housekeeping or managing your Housekeeping? N    Patient Care Team: Dorothyann Peng, MD as PCP - General (Internal Medicine) Parke Poisson, MD as PCP - Cardiology (Cardiology) Chalmers Guest, MD as Consulting Physician (Ophthalmology)  Indicate any recent Medical Services you may have received from other than [facility] providers in the past year (date may be approximate).     Assessment:   This is a routine wellness examination for [place].  Hearing/Vision screen Hearing Screening - Comments:: Denies hearing issues Vision Screening - Comments:: Regular eye exams, Dr. Harlon Flor   Goals Addressed             This Visit's Progress    Patient Stated       10/12/2023,  stay healthy and keep weight down       Depression Screen    10/12/2023    3:03 PM 09/20/2023    9:11 AM 05/03/2023   10:37 AM 12/27/2022   10:07 AM 08/25/2022    9:56 AM 07/28/2021   10:31 AM 07/22/2020   11:10 AM  PHQ 2/9 Scores  PHQ - 2 Score 0 0 0 0 0 0 0  PHQ- 9 Score  0 0        Fall Risk    10/12/2023    3:02 PM 09/20/2023    9:11 AM 05/03/2023   10:37 AM 12/27/2022   10:07 AM 08/25/2022    9:56 AM  Fall Risk   Falls in the past year? 0 0 0 0 0  Number falls in past yr: 0 0 0 0 0  Injury with Fall? 0 0 0 0 0  Risk for fall due to : Medication side effect No Fall Risks No Fall Risks No Fall Risks Medication side effect  Follow up Falls prevention discussed;Falls evaluation completed Falls evaluation completed Falls evaluation completed Falls evaluation completed Falls prevention discussed;Education provided;Falls evaluation completed    MEDICARE RISK AT HOME: Medicare Risk at Home Any stairs in or around the home?: Yes If so, are there any without handrails?: No Home free of loose throw rugs in walkways, pet beds, electrical cords, etc?: Yes Adequate lighting in your home to reduce risk of falls?: Yes Life alert?: No Use of a cane, walker or w/c?: No Grab bars in the bathroom?: Yes Shower chair or bench in shower?: Yes Elevated toilet seat or a handicapped toilet?: Yes  TIMED UP AND GO:  Was the test performed?  Yes  Length of time to ambulate 10 feet: 5 sec Gait steady and fast without use of assistive device    Cognitive Function:        10/12/2023    3:03 PM 08/25/2022    9:57 AM 07/28/2021   10:32 AM  07/22/2020   11:11 AM 07/18/2019    9:27 AM  6CIT Screen  What Year? 0 points 0 points 0 points 0 points 0 points  What month? 0 points 0 points 0 points 0 points 0 points  What time? 0 points 0 points 0 points 0 points 0 points  Count back from 20 0 points 0 points 0 points 0 points 0 points  Months in reverse 0 points 0 points 0 points 0 points 0 points   Repeat phrase 0 points 2 points 2 points 0 points 0 points  Total Score 0 points 2 points 2 points 0 points 0 points    Immunizations Immunization History  Administered Date(s) Administered   DTaP 01/22/2014   Fluad Quad(high Dose 65+) 07/22/2020, 08/02/2022, 07/27/2023   Influenza, High Dose Seasonal PF 07/06/2020, 07/06/2021   Influenza-Unspecified 07/24/2013, 07/11/2018, 07/18/2019   PFIZER Comirnaty(Gray Top)Covid-19 Tri-Sucrose Vaccine 03/22/2021   PFIZER(Purple Top)SARS-COV-2 Vaccination 01/04/2020, 01/28/2020, 08/12/2020, 03/22/2021, 07/20/2021   Pfizer Covid-19 Vaccine Bivalent Booster 54yrs & up 07/27/2023   Pneumococcal Conjugate-13 07/13/2018   Pneumococcal Polysaccharide-23 07/18/2019   Pneumococcal-Unspecified 07/10/2017   Respiratory Syncytial Virus Vaccine,Recomb Aduvanted(Arexvy) 07/27/2023   Zoster Recombinant(Shingrix) 12/22/2017, 01/27/2018, 04/05/2018    TDAP status: Up to date  Flu Vaccine status: Up to date  Pneumococcal vaccine status: Up to date  Covid-19 vaccine status: Completed vaccines  Qualifies for Shingles Vaccine? Yes   Zostavax completed Yes   Shingrix Completed?: Yes  Screening Tests Health Maintenance  Topic Date Due   FOOT EXAM  11/30/2022   COVID-19 Vaccine (8 - 2024-25 season) 09/21/2023   OPHTHALMOLOGY EXAM  09/23/2023   DTaP/Tdap/Td (2 - Tdap) 01/23/2024   HEMOGLOBIN A1C  03/19/2024   MAMMOGRAM  06/12/2024   Diabetic kidney evaluation - eGFR measurement  09/19/2024   Diabetic kidney evaluation - Urine ACR  09/19/2024   Medicare Annual Wellness (AWV)  10/11/2024   Pneumonia Vaccine 68+ Years old  Completed   INFLUENZA VACCINE  Completed   DEXA SCAN  Completed   Hepatitis C Screening  Completed   Zoster Vaccines- Shingrix  Completed   HPV VACCINES  Aged Out   Colonoscopy  Discontinued    Health Maintenance  Health Maintenance Due  Topic Date Due   FOOT EXAM  11/30/2022   COVID-19 Vaccine (8 - 2024-25 season) 09/21/2023    OPHTHALMOLOGY EXAM  09/23/2023    Colorectal cancer screening: No longer required.   Mammogram status: Completed 06/13/2023. Repeat every year  Bone Density status: Completed 12/08/2021.   Lung Cancer Screening: (Low Dose CT Chest recommended if Age 68-80 years, 20 pack-year currently smoking OR have quit w/in 15years.) does not qualify.   Lung Cancer Screening Referral: no  Additional Screening:  Hepatitis C Screening: does qualify; Completed 03/14/2018  Vision Screening: Recommended annual ophthalmology exams for early detection of glaucoma and other disorders of the eye. Is the patient up to date with their annual eye exam?  Yes  Who is the provider or what is the name of the office in which the patient attends annual eye exams? Dr. Harlon Flor If pt is not established with a provider, would they like to be referred to a provider to establish care? No .   Dental Screening: Recommended annual dental exams for proper oral hygiene  Diabetic Foot Exam: Diabetic Foot Exam: Overdue, Pt has been advised about the importance in completing this exam. Pt is scheduled for diabetic foot exam on next appointment.  Community Resource Referral / Chronic Care Management:  CRR required this visit?  No   CCM required this visit?  No     Plan:     I have personally reviewed and noted the following in the patient's chart:   Medical and social history Use of alcohol, tobacco or illicit drugs  Current medications and supplements including opioid prescriptions. Patient is not currently taking opioid prescriptions. Functional ability and status Nutritional status Physical activity Advanced directives List of other physicians Hospitalizations, surgeries, and ER visits in previous 12 months Vitals Screenings to include cognitive, depression, and falls Referrals and appointments  In addition, I have reviewed and discussed with patient certain preventive protocols, quality metrics, and best  practice recommendations. A written personalized care plan for preventive services as well as general preventive health recommendations were provided to patient.     Barb Merino, LPN   [id number]   After Visit Summary: (In Person-Printed) AVS printed and given to the patient  Nurse Notes: none

## 2023-10-12 NOTE — Patient Instructions (Signed)
Tammy Preston , Thank you for taking time to come for your Medicare Wellness Visit. I appreciate your ongoing commitment to your health goals. Please review the following plan we discussed and let me know if I can assist you in the future.   Referrals/Orders/Follow-Ups/Clinician Recommendations: none  This is a list of the screening recommended for you and due dates:  Health Maintenance  Topic Date Due   Complete foot exam   11/30/2022   COVID-19 Vaccine (8 - 2024-25 season) 09/21/2023   Eye exam for diabetics  09/23/2023   DTaP/Tdap/Td vaccine (2 - Tdap) 01/23/2024   Hemoglobin A1C  03/19/2024   Mammogram  06/12/2024   Yearly kidney function blood test for diabetes  09/19/2024   Yearly kidney health urinalysis for diabetes  09/19/2024   Medicare Annual Wellness Visit  10/11/2024   Pneumonia Vaccine  Completed   Flu Shot  Completed   DEXA scan (bone density measurement)  Completed   Hepatitis C Screening  Completed   Zoster (Shingles) Vaccine  Completed   HPV Vaccine  Aged Out   Colon Cancer Screening  Discontinued    Advanced directives: (Declined) Advance directive discussed with you today. Even though you declined this today, please call our office should you change your mind, and we can give you the proper paperwork for you to fill out.  Next Medicare Annual Wellness Visit scheduled for next year: No, office will schedule  Insert Preventive Care attachment Insert FALL PREVENTION attachment if needed

## 2023-10-16 DIAGNOSIS — N1831 Chronic kidney disease, stage 3a: Secondary | ICD-10-CM | POA: Diagnosis not present

## 2023-10-23 ENCOUNTER — Ambulatory Visit (INDEPENDENT_AMBULATORY_CARE_PROVIDER_SITE_OTHER): Payer: Medicare Other | Admitting: Podiatry

## 2023-10-23 ENCOUNTER — Encounter: Payer: Self-pay | Admitting: Podiatry

## 2023-10-23 DIAGNOSIS — E1149 Type 2 diabetes mellitus with other diabetic neurological complication: Secondary | ICD-10-CM | POA: Diagnosis not present

## 2023-10-23 DIAGNOSIS — B351 Tinea unguium: Secondary | ICD-10-CM

## 2023-10-23 DIAGNOSIS — M79675 Pain in left toe(s): Secondary | ICD-10-CM | POA: Diagnosis not present

## 2023-10-23 DIAGNOSIS — Q828 Other specified congenital malformations of skin: Secondary | ICD-10-CM | POA: Diagnosis not present

## 2023-10-23 DIAGNOSIS — M79674 Pain in right toe(s): Secondary | ICD-10-CM | POA: Diagnosis not present

## 2023-10-24 DIAGNOSIS — N2581 Secondary hyperparathyroidism of renal origin: Secondary | ICD-10-CM | POA: Diagnosis not present

## 2023-10-24 DIAGNOSIS — I129 Hypertensive chronic kidney disease with stage 1 through stage 4 chronic kidney disease, or unspecified chronic kidney disease: Secondary | ICD-10-CM | POA: Diagnosis not present

## 2023-10-24 DIAGNOSIS — D631 Anemia in chronic kidney disease: Secondary | ICD-10-CM | POA: Diagnosis not present

## 2023-10-24 DIAGNOSIS — N1831 Chronic kidney disease, stage 3a: Secondary | ICD-10-CM | POA: Diagnosis not present

## 2023-10-24 DIAGNOSIS — E1122 Type 2 diabetes mellitus with diabetic chronic kidney disease: Secondary | ICD-10-CM | POA: Diagnosis not present

## 2023-11-14 ENCOUNTER — Ambulatory Visit: Payer: Medicare Other | Admitting: Internal Medicine

## 2023-11-14 ENCOUNTER — Encounter: Payer: Self-pay | Admitting: Internal Medicine

## 2023-11-14 ENCOUNTER — Ambulatory Visit: Payer: Medicare Other | Attending: Internal Medicine | Admitting: Internal Medicine

## 2023-11-14 VITALS — BP 128/76 | HR 103 | Ht 65.0 in | Wt 192.4 lb

## 2023-11-14 DIAGNOSIS — I10 Essential (primary) hypertension: Secondary | ICD-10-CM | POA: Diagnosis not present

## 2023-11-14 DIAGNOSIS — I7090 Unspecified atherosclerosis: Secondary | ICD-10-CM | POA: Insufficient documentation

## 2023-11-14 DIAGNOSIS — I2510 Atherosclerotic heart disease of native coronary artery without angina pectoris: Secondary | ICD-10-CM | POA: Diagnosis not present

## 2023-11-14 DIAGNOSIS — R5383 Other fatigue: Secondary | ICD-10-CM | POA: Diagnosis not present

## 2023-11-14 DIAGNOSIS — E785 Hyperlipidemia, unspecified: Secondary | ICD-10-CM | POA: Insufficient documentation

## 2023-11-14 DIAGNOSIS — Z136 Encounter for screening for cardiovascular disorders: Secondary | ICD-10-CM

## 2023-11-14 NOTE — Progress Notes (Unsigned)
  Cardiology Office Note:  .   Date:  11/14/2023  ID:  Tammy Preston, DOB March 25, 1946, MRN [id number] PCP: Dorothyann Peng, MD  [facility] Providers Cardiologist:  Parke Poisson, MD    History of Present Illness: .   Tammy Preston is a 78 y.o. female.  Discussed the use of AI scribe software for clinical note transcription with the patient, who gave verbal consent to proceed.  History of Present Illness   The patient, with a history of hypertension, hyperlipidemia, and diabetes, presents for a routine follow-up. She reports no recent dizziness, a symptom she previously experienced. She has been maintaining an active lifestyle, working out three to four days a week, although she has not been able to exercise since December due to facility closure. She denies any chest pain, shortness of breath, or palpitations during exercise. Her last A1c was 7.3, down from 8, indicating better control of her diabetes. She also reports no recent fatigue, a symptom she previously attributed to vertigo.  The patient is currently on amlodipine and losartan for hypertension, Jardiance for diabetes, and rosuvastatin for hyperlipidemia. She reports no issues with these medications.        ROS: negative except per HPI above.  Studies Reviewed: Marland Kitchen   EKG Interpretation Date/Time:  Tuesday November 14 2023 11:19:01 EST Ventricular Rate:  103 PR Interval:  152 QRS Duration:  74 QT Interval:  338 QTC Calculation: 442 R Axis:   49  Text Interpretation: Sinus tachycardia Biatrial enlargement Confirmed by Weston Brass ([id number]) on 11/14/2023 11:35:41 AM    Results   LABS A1c: 7.3%     Risk Assessment/Calculations:             Physical Exam:   VS:  BP 128/76 (BP Location: Left Arm, Patient Position: Sitting, Cuff Size: Normal)   Pulse (!) 103   Ht 5\' 5"  (1.651 m)   Wt 192 lb 6.4 oz (87.3 kg)   SpO2 96%   BMI 32.02 kg/m    Wt Readings from Last 3 Encounters:  11/14/23 192 lb 6.4 oz  (87.3 kg)  10/12/23 186 lb 9.6 oz (84.6 kg)  09/20/23 192 lb 3.2 oz (87.2 kg)     Physical Exam   VITALS: BP- 128/76 CHEST: Respiratory effort normal CARDIOVASCULAR: Heart sounds normal     GEN: Well nourished, well developed in no acute distress NECK: No JVD; No carotid bruits CARDIAC: RRR, no murmurs, rubs, gallops RESPIRATORY:  Clear to auscultation without rales, wheezing or rhonchi  ABDOMEN: Soft, non-tender, non-distended EXTREMITIES:  No edema; No deformity   ASSESSMENT AND PLAN: .    Assessment & Plan Coronary artery calcification  Primary hypertension  Fatigue, unspecified type  Atherosclerosis  Hyperlipidemia, unspecified hyperlipidemia type   Assessment and Plan    Vertigo Resolved with no recent episodes. -No changes to current management.  Type 2 Diabetes Mellitus Improved control with last A1c at 7.3, down from 8.0. Currently on Jardiance. -Continue current management and regular exercise.  Hypertension Well controlled with BP 128/76 on Amlodipine and Losartan. -Continue current medications.  Hyperlipidemia Excellent control on Rosuvastatin with no recent changes. -Continue Rosuvastatin.  Coronary Artery calcifications Asymptomatic with no recent chest pain or shortness of breath. Noted calcifications in arteries, but cholesterol is well controlled on statin therapy. -Continue current medications and regular exercise. -Report any chest pain or pressure immediately.  Follow-up in 1 year.

## 2023-11-14 NOTE — Patient Instructions (Signed)
Medication Instructions:  Your physician recommends that you continue on your current medications as directed. Please refer to the Current Medication list given to you today.    *If you need a refill on your cardiac medications before your next appointment, please call your pharmacy*   Lab Work: None    If you have labs (blood work) drawn today and your tests are completely normal, you will receive your results only by: MyChart Message (if you have MyChart) OR A paper copy in the mail If you have any lab test that is abnormal or we need to change your treatment, we will call you to review the results.   Testing/Procedures: None    Follow-Up: At [facility], you and your health needs are our priority.  As part of our continuing mission to provide you with exceptional heart care, we have created designated Provider Care Teams.  These Care Teams include your primary Cardiologist (physician) and Advanced Practice Providers (APPs -  Physician Assistants and Nurse Practitioners) who all work together to provide you with the care you need, when you need it.  We recommend signing up for the patient portal called "MyChart".  Sign up information is provided on this After Visit Summary.  MyChart is used to connect with patients for Virtual Visits (Telemedicine).  Patients are able to view lab/test results, encounter notes, upcoming appointments, etc.  Non-urgent messages can be sent to your provider as well.   To learn more about what you can do with MyChart, go to [email or web address].    Your next appointment:   1 year(s)  The format for your next appointment:   In Person  Provider:   Parke Poisson, MD    Other Instructions

## 2023-12-04 ENCOUNTER — Ambulatory Visit (INDEPENDENT_AMBULATORY_CARE_PROVIDER_SITE_OTHER): Payer: Medicare Other

## 2023-12-04 ENCOUNTER — Ambulatory Visit
Admission: EM | Admit: 2023-12-04 | Discharge: 2023-12-04 | Disposition: A | Discharge status: Home / Self Care | Payer: Medicare Other | Attending: Physician Assistant | Admitting: Physician Assistant

## 2023-12-04 ENCOUNTER — Encounter: Payer: Self-pay | Admitting: Emergency Medicine

## 2023-12-04 DIAGNOSIS — J209 Acute bronchitis, unspecified: Secondary | ICD-10-CM | POA: Diagnosis not present

## 2023-12-04 DIAGNOSIS — E1122 Type 2 diabetes mellitus with diabetic chronic kidney disease: Secondary | ICD-10-CM | POA: Diagnosis not present

## 2023-12-04 DIAGNOSIS — Z7984 Long term (current) use of oral hypoglycemic drugs: Secondary | ICD-10-CM | POA: Diagnosis not present

## 2023-12-04 DIAGNOSIS — R059 Cough, unspecified: Secondary | ICD-10-CM | POA: Diagnosis not present

## 2023-12-04 DIAGNOSIS — R051 Acute cough: Secondary | ICD-10-CM

## 2023-12-04 DIAGNOSIS — N1831 Chronic kidney disease, stage 3a: Secondary | ICD-10-CM | POA: Diagnosis not present

## 2023-12-04 MED ORDER — PREDNISONE 20 MG PO TABS: 40.0000 mg | ORAL_TABLET | Freq: Every day | ORAL | 0 refills | Status: AC

## 2023-12-04 NOTE — ED Provider Notes (Signed)
 [facility]    CSN: [id number] Arrival date & time: 12/04/23  0803      History   Chief Complaint Chief Complaint  Patient presents with   Cough    HPI Tammy Preston is a 78 y.o. female.   Patient here today for evaluation of productive cough and sneezing she has had for 3 weeks.  She reports her cough has improved somewhat.  She states symptoms started after her husband had upper respiratory symptoms and fever.  She reports she had similar symptoms but hers were not as severe.  She did take some Mucinex and other cold medication at home which was somewhat helpful.  She denies any fever  The history is provided by the patient.  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 06/15/2022   Chronic kidney disease    CKD stage 3   Chronic kidney disease, stage 3 (moderate) 05/20/2017   Diabetes mellitus without complication (HCC)    Endometrial hyperplasia 05/20/2017   Glomerular disorders in diseases classified elsewhere 05/20/2017   Hyperlipemia    Hypertension    Hypertensive chronic kidney disease with stage 1 through stage 4 chronic kidney disease, or unspecified chronic kidney disease 05/20/2017   Numbness and tingling of both feet    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/20/2017    Patient Active Problem List   Diagnosis Date Noted   Other emphysema (HCC) 09/20/2023   Cigarette smoker 05/03/2023   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   Atherosclerosis of native coronary artery of native heart without angina pectoris 06/15/2022   Aortic atherosclerosis (HCC) 06/15/2022   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 06/15/2022   Colon cancer screening 01/19/2021    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   Obesity 01/19/2021   History of colonic polyps 01/19/2021   Right carpal tunnel syndrome 05/23/2017   Stage 3 chronic kidney disease (HCC) 05/20/2017   Endometrial hyperplasia 05/20/2017   Hypertensive chronic kidney disease with stage 1 through stage 4 chronic kidney disease, or unspecified chronic kidney disease 05/20/2017   Glomerular disorders in diseases classified elsewhere 05/20/2017   Type 2 diabetes mellitus with stage 3a chronic kidney disease, without long-term current use of insulin (HCC) 05/20/2017    Past Surgical History:  Procedure Laterality Date   BACK SURGERY     BREAST LUMPECTOMY Left    COLONOSCOPY     DILATATION & CURETTAGE/HYSTEROSCOPY WITH MYOSURE N/A 04/07/2017   Procedure: DILATATION & CURETTAGE/HYSTEROSCOPY WITH MYOSURE;  Surgeon: Ivery Marking, MD;  Location: [facility];  Service: Gynecology;  Laterality: N/A;   KNEE SURGERY      OB History   No obstetric history on file.      Home Medications    Prior to Admission medications   Medication Sig Start Date End Date Taking? Authorizing Provider  allopurinol  (ZYLOPRIM ) 100 MG tablet TAKE 2 TABLETS DAILY 12/07/22  Yes Cleave Curling, MD  amLODipine  (NORVASC ) 2.5 MG tablet TAKE 1 TABLET AT BEDTIME 06/08/23  Yes Cleave Curling, MD  aspirin EC 81 MG tablet Take 81 mg by mouth daily after breakfast.    Yes [provider]  Blood Glucose Monitoring Suppl (FREESTYLE LITE)  w/Device KIT Use to check blood sugars. 06/15/22  Yes Cleave Curling, MD  Calcium  Carb-Cholecalciferol (CALCIUM  + D3 PO) Take 1 tablet by mouth daily.    Yes [provider]  Cholecalciferol (VITAMIN D) 2000 units tablet Take 2,000 Units by mouth daily.   Yes [provider]  FREESTYLE LITE test strip USE AS INSTRUCTED TO CHECK BLOOD SUGAR TWICE A DAY 10/31/22  Yes Cleave Curling, MD  gabapentin  (NEURONTIN ) 100 MG capsule TAKE 1 CAPSULE AT BEDTIME 09/11/23  Yes Charity Conch, DPM  JARDIANCE  25 MG TABS tablet TAKE 1 TABLET DAILY BEFORE BREAKFAST 06/14/23  Yes Cleave Curling, MD  Lancets (FREESTYLE) lancets USE AS INSTRUCTED TO CHECK BLOOD SUGAR TWICE A DAY 10/31/22  Yes Cleave Curling, MD  losartan  (COZAAR ) 100 MG tablet TAKE 1 TABLET DAILY 06/08/23  Yes Cleave Curling, MD  metFORMIN  (GLUCOPHAGE ) 500 MG tablet TAKE 1 TABLET DAILY AFTER BREAKFAST 01/26/23  Yes Cleave Curling, MD  Multiple Vitamins-Minerals (ONE-A-DAY WOMENS 50 PLUS PO) Take 1 tablet by mouth daily.   Yes [provider]  predniSONE  (DELTASONE ) 20 MG tablet Take 2 tablets (40 mg total) by mouth daily with breakfast for 5 days. 12/04/23 12/09/23 Yes Vernestine Gondola, PA-C  RESTASIS 0.05 % ophthalmic emulsion 2 times per day 03/27/19  Yes [provider]  rosuvastatin  (CRESTOR ) 20 MG tablet Take 1 tablet (20 mg total) by mouth daily. 02/06/23  Yes Euell Herrlich, MD    Family History Family History  Problem Relation Age of Onset   Stroke Mother    Congestive Heart Failure Father     Social History Social History   Tobacco Use   Smoking status: Some Days    Current packs/day: 0.25    Average packs/day: 0.3 packs/day for 58.1 years (14.5 ttl pk-yrs)    Types: Cigarettes    Start date: 10/24/1965   Smokeless tobacco: Never   Tobacco comments:    11/30/21-She has cut back to 3 cigs/day. Plans to quit by 4 months.     03/03/22-She states she is down to 1 cig/day, still plans to quit w/in 2 months    08/25/22-she is now going 2-3 days w/o any  Vaping Use   Vaping status: Never Used  Substance Use Topics   Alcohol use: No    Alcohol/week: 0.0 standard drinks of alcohol   Drug use: No     Allergies   Percocet [oxycodone-acetaminophen ] and Shellfish allergy   Review of Systems Review of Systems  Constitutional:  Negative for chills and fever.  HENT:  Positive for congestion. Negative for ear pain and sore throat.   Eyes:  Negative for discharge and redness.   Respiratory:  Positive for cough. Negative for shortness of breath and wheezing.   Gastrointestinal:  Negative for abdominal pain, diarrhea, nausea and vomiting.     Physical Exam Triage Vital Signs ED Triage Vitals  Encounter Vitals Group     BP      Systolic BP Percentile      Diastolic BP Percentile      Pulse      Resp      Temp      Temp src      SpO2      Weight      Height      Head Circumference      Peak Flow      Pain Score      Pain Loc      Pain Education  Exclude from Growth Chart    No data found.  Updated Vital Signs BP 120/81 (BP Location: Right Arm)   Pulse (!) 109   Temp 98.4 F (36.9 C) (Oral)   Resp 16   SpO2 99%      Physical Exam Vitals and nursing note reviewed.  Constitutional:      General: She is not in acute distress.    Appearance: Normal appearance. She is not ill-appearing.  HENT:     Head: Normocephalic and atraumatic.     Nose: Congestion present.     Mouth/Throat:     Mouth: Mucous membranes are moist.     Pharynx: No oropharyngeal exudate or posterior oropharyngeal erythema.  Eyes:     Conjunctiva/sclera: Conjunctivae normal.  Cardiovascular:     Rate and Rhythm: Normal rate and regular rhythm.     Heart sounds: Normal heart sounds. No murmur heard. Pulmonary:     Effort: Pulmonary effort is normal. No respiratory distress.     Breath sounds: Normal breath sounds. No wheezing, rhonchi or rales.  Skin:    General: Skin is warm and dry.  Neurological:     Mental Status: She is alert.  Psychiatric:        Mood and Affect: Mood normal.        Thought Content: Thought content normal.      UC Treatments / Results  Labs (all labs ordered are listed, but only abnormal results are displayed) Labs Reviewed - No data to display  EKG   Radiology DG Chest 2 View Result Date: 12/04/2023 CLINICAL DATA:  78 year old female with cough and sneezing. EXAM: CHEST - 2 VIEW COMPARISON:  Chest CT 01/20/2023 and earlier.  FINDINGS: Chronic right lower lobe calcified lung granuloma, benign. Chronic eventration of the right hemidiaphragm, normal variant. Normal cardiac size and mediastinal contours. Visualized tracheal air column is within normal limits. Chronic left axillary or chest wall surgical clips. No pneumothorax, pulmonary edema, pleural effusion or acute pulmonary opacity. No acute osseous abnormality identified. Negative visible bowel gas. IMPRESSION: No acute cardiopulmonary abnormality. Electronically Signed   By: Marlise Simpers M.D.   On: 12/04/2023 10:26    Procedures Procedures (including critical care time)  Medications Ordered in UC Medications - No data to display  Initial Impression / Assessment and Plan / UC Course  I have reviewed the triage vital signs and the nursing notes.  Pertinent labs & imaging results that were available during my care of the patient were reviewed by me and considered in my medical decision making (see chart for details).    Chest x-ray ordered without abnormal findings.  Suspect most likely bronchitis and will treat with steroid burst.  Discussed that given diabetes (last A1C just over 7 per patient) she should monitor blood sugar as prednisone  might elevate same.  Patient expressed understanding. Encouraged follow up if no gradual improvement or with any further concerns.   Final Clinical Impressions(s) / UC Diagnoses   Final diagnoses:  Acute cough  Acute bronchitis, unspecified organism  Type 2 diabetes mellitus with stage 3a chronic kidney disease, without long-term current use of insulin [facility])   Discharge Instructions   None    ED Prescriptions     Medication Sig Dispense Auth. Provider   predniSONE  (DELTASONE ) 20 MG tablet Take 2 tablets (40 mg total) by mouth daily with breakfast for 5 days. 10 tablet Vernestine Gondola, PA-C      PDMP not reviewed this encounter.   Jami Mcclintock  F, PA-C 12/04/23 1114

## 2023-12-04 NOTE — ED Triage Notes (Signed)
 Pt reports productive cough w/ sneezing x3 weeks. No other symptoms. Pt is concerned about pneumonia with prolonged cough. Some relief with mucinex and other cold meds at home.

## 2023-12-05 ENCOUNTER — Other Ambulatory Visit: Payer: Self-pay | Admitting: Internal Medicine

## 2023-12-14 DIAGNOSIS — N183 Chronic kidney disease, stage 3 unspecified: Secondary | ICD-10-CM | POA: Diagnosis not present

## 2023-12-14 DIAGNOSIS — R3121 Asymptomatic microscopic hematuria: Secondary | ICD-10-CM | POA: Diagnosis not present

## 2023-12-28 ENCOUNTER — Ambulatory Visit: Payer: Medicare Other | Admitting: Podiatry

## 2023-12-29 DIAGNOSIS — I700 Atherosclerosis of aorta: Secondary | ICD-10-CM | POA: Diagnosis not present

## 2023-12-29 DIAGNOSIS — Z1159 Encounter for screening for other viral diseases: Secondary | ICD-10-CM | POA: Diagnosis not present

## 2023-12-29 DIAGNOSIS — Z0189 Encounter for other specified special examinations: Secondary | ICD-10-CM | POA: Diagnosis not present

## 2023-12-29 DIAGNOSIS — J439 Emphysema, unspecified: Secondary | ICD-10-CM | POA: Diagnosis not present

## 2023-12-29 DIAGNOSIS — M1A3790 Chronic gout due to renal impairment, unspecified ankle and foot, without tophus (tophi): Secondary | ICD-10-CM | POA: Diagnosis not present

## 2023-12-29 DIAGNOSIS — Z136 Encounter for screening for cardiovascular disorders: Secondary | ICD-10-CM | POA: Diagnosis not present

## 2023-12-29 DIAGNOSIS — F17211 Nicotine dependence, cigarettes, in remission: Secondary | ICD-10-CM | POA: Diagnosis not present

## 2023-12-29 DIAGNOSIS — Z114 Encounter for screening for human immunodeficiency virus [HIV]: Secondary | ICD-10-CM | POA: Diagnosis not present

## 2023-12-29 DIAGNOSIS — N183 Chronic kidney disease, stage 3 unspecified: Secondary | ICD-10-CM | POA: Diagnosis not present

## 2023-12-29 DIAGNOSIS — E559 Vitamin D deficiency, unspecified: Secondary | ICD-10-CM | POA: Diagnosis not present

## 2023-12-29 DIAGNOSIS — J438 Other emphysema: Secondary | ICD-10-CM | POA: Diagnosis not present

## 2023-12-29 DIAGNOSIS — E1122 Type 2 diabetes mellitus with diabetic chronic kidney disease: Secondary | ICD-10-CM | POA: Diagnosis not present

## 2023-12-29 DIAGNOSIS — Z79899 Other long term (current) drug therapy: Secondary | ICD-10-CM | POA: Diagnosis not present

## 2024-01-04 ENCOUNTER — Encounter: Payer: Self-pay | Admitting: Podiatry

## 2024-01-04 ENCOUNTER — Ambulatory Visit (INDEPENDENT_AMBULATORY_CARE_PROVIDER_SITE_OTHER): Payer: Medicare Other | Admitting: Podiatry

## 2024-01-04 DIAGNOSIS — Q828 Other specified congenital malformations of skin: Secondary | ICD-10-CM

## 2024-01-04 DIAGNOSIS — M79674 Pain in right toe(s): Secondary | ICD-10-CM | POA: Diagnosis not present

## 2024-01-04 DIAGNOSIS — B351 Tinea unguium: Secondary | ICD-10-CM

## 2024-01-04 DIAGNOSIS — M79675 Pain in left toe(s): Secondary | ICD-10-CM

## 2024-01-04 DIAGNOSIS — E1149 Type 2 diabetes mellitus with other diabetic neurological complication: Secondary | ICD-10-CM

## 2024-01-06 NOTE — Progress Notes (Signed)
 Subjective: Chief Complaint  Patient presents with   [facility]    RM#12 [facility]  patient stated here for routine care no concerns or issues.      78 y.o. returns the office today for painful, elongated, thickened toenails which she cannot trim herself. Denies any redness or drainage around the nails.    PCP: Dorothyann Peng, MD Last see: 05/20/2023 Last A1c was 7.3 on September 20, 2023  Objective: AAO 3, NAD DP/PT pulses palpable, CRT less than 3 seconds Nails hypertrophic, dystrophic, elongated, brittle, discolored 10. There is tenderness overlying the nails 1-5 bilaterally. There is no surrounding erythema or drainage along the nail sites. Hperkeratotic tissue right submetatarsal 5 and left sub metatarsal 2.  No underlying ulceration drainage or signs of infection.   No other open lesions or preulcerative lesions. Dry skin present bilaterally. No open lesions or pre-ulcerative lesions are identified. No pain with calf compression, swelling, warmth, erythema.  Assessment: Patient presents with symptomatic onychomycosis, hyperkeratotic lesions, neuropathy  Plan: -Treatment options including alternatives, risks, complications were discussed -Nails sharply debrided 10 without complication/bleeding. -Hyperkeratotic lesion sharply debrided x2 without any complications or bleeding. Moisturizer/offloading daily -Continue gabapentin for neuropathy. -Moisturizer for dry skin. -Continue daily foot inspection, glucose control.  Return in about 9 weeks (around 03/07/2024).  Ovid Curd, DPM

## 2024-01-10 DIAGNOSIS — I2510 Atherosclerotic heart disease of native coronary artery without angina pectoris: Secondary | ICD-10-CM | POA: Diagnosis not present

## 2024-01-10 DIAGNOSIS — R1011 Right upper quadrant pain: Secondary | ICD-10-CM | POA: Diagnosis not present

## 2024-01-10 DIAGNOSIS — E1122 Type 2 diabetes mellitus with diabetic chronic kidney disease: Secondary | ICD-10-CM | POA: Diagnosis not present

## 2024-01-10 DIAGNOSIS — N390 Urinary tract infection, site not specified: Secondary | ICD-10-CM | POA: Diagnosis not present

## 2024-01-10 DIAGNOSIS — Z794 Long term (current) use of insulin: Secondary | ICD-10-CM | POA: Diagnosis not present

## 2024-01-10 DIAGNOSIS — K5730 Diverticulosis of large intestine without perforation or abscess without bleeding: Secondary | ICD-10-CM | POA: Diagnosis not present

## 2024-01-10 DIAGNOSIS — N189 Chronic kidney disease, unspecified: Secondary | ICD-10-CM | POA: Diagnosis not present

## 2024-01-10 DIAGNOSIS — D259 Leiomyoma of uterus, unspecified: Secondary | ICD-10-CM | POA: Diagnosis not present

## 2024-01-10 DIAGNOSIS — R319 Hematuria, unspecified: Secondary | ICD-10-CM | POA: Diagnosis not present

## 2024-01-10 DIAGNOSIS — D71 Functional disorders of polymorphonuclear neutrophils: Secondary | ICD-10-CM | POA: Diagnosis not present

## 2024-01-11 DIAGNOSIS — K5730 Diverticulosis of large intestine without perforation or abscess without bleeding: Secondary | ICD-10-CM | POA: Diagnosis not present

## 2024-01-11 DIAGNOSIS — R1011 Right upper quadrant pain: Secondary | ICD-10-CM | POA: Diagnosis not present

## 2024-01-11 DIAGNOSIS — D259 Leiomyoma of uterus, unspecified: Secondary | ICD-10-CM | POA: Diagnosis not present

## 2024-01-12 DIAGNOSIS — K5790 Diverticulosis of intestine, part unspecified, without perforation or abscess without bleeding: Secondary | ICD-10-CM | POA: Diagnosis not present

## 2024-01-12 DIAGNOSIS — N183 Chronic kidney disease, stage 3 unspecified: Secondary | ICD-10-CM | POA: Diagnosis not present

## 2024-01-12 DIAGNOSIS — Z6831 Body mass index (BMI) 31.0-31.9, adult: Secondary | ICD-10-CM | POA: Diagnosis not present

## 2024-01-12 DIAGNOSIS — Z0001 Encounter for general adult medical examination with abnormal findings: Secondary | ICD-10-CM | POA: Diagnosis not present

## 2024-01-12 DIAGNOSIS — E669 Obesity, unspecified: Secondary | ICD-10-CM | POA: Diagnosis not present

## 2024-01-12 DIAGNOSIS — E1122 Type 2 diabetes mellitus with diabetic chronic kidney disease: Secondary | ICD-10-CM | POA: Diagnosis not present

## 2024-01-12 DIAGNOSIS — F17210 Nicotine dependence, cigarettes, uncomplicated: Secondary | ICD-10-CM | POA: Diagnosis not present

## 2024-01-12 DIAGNOSIS — Z7189 Other specified counseling: Secondary | ICD-10-CM | POA: Diagnosis not present

## 2024-01-15 ENCOUNTER — Ambulatory Visit: Payer: Medicare Other | Admitting: Internal Medicine

## 2024-01-23 ENCOUNTER — Other Ambulatory Visit: Payer: Self-pay | Admitting: Nurse Practitioner

## 2024-01-23 DIAGNOSIS — I1310 Hypertensive heart and chronic kidney disease without heart failure, with stage 1 through stage 4 chronic kidney disease, or unspecified chronic kidney disease: Secondary | ICD-10-CM | POA: Diagnosis not present

## 2024-01-23 DIAGNOSIS — E669 Obesity, unspecified: Secondary | ICD-10-CM | POA: Diagnosis not present

## 2024-01-23 DIAGNOSIS — K5790 Diverticulosis of intestine, part unspecified, without perforation or abscess without bleeding: Secondary | ICD-10-CM | POA: Diagnosis not present

## 2024-01-24 DIAGNOSIS — M545 Low back pain, unspecified: Secondary | ICD-10-CM | POA: Diagnosis not present

## 2024-02-21 ENCOUNTER — Ambulatory Visit: Payer: Medicare Other | Admitting: Podiatry

## 2024-03-07 ENCOUNTER — Ambulatory Visit: Admitting: Podiatry

## 2024-03-22 ENCOUNTER — Ambulatory Visit: Admitting: Podiatry

## 2024-03-22 DIAGNOSIS — M79675 Pain in left toe(s): Secondary | ICD-10-CM | POA: Diagnosis not present

## 2024-03-22 DIAGNOSIS — E1149 Type 2 diabetes mellitus with other diabetic neurological complication: Secondary | ICD-10-CM

## 2024-03-22 DIAGNOSIS — B351 Tinea unguium: Secondary | ICD-10-CM | POA: Diagnosis not present

## 2024-03-22 DIAGNOSIS — M79674 Pain in right toe(s): Secondary | ICD-10-CM | POA: Diagnosis not present

## 2024-03-22 NOTE — Progress Notes (Signed)
 Subjective: Chief Complaint  Patient presents with   Nail Problem    Patient is here for routine foot care and nail trimming.    78 y.o. returns the office today for painful, elongated, thickened toenails which she cannot trim herself. Denies any redness or drainage around the nails.    PCP: Carolyn Cisco, NP Last seen: 01/23/2024 Last A1c was 7.3 on September 20, 2023  Objective: AAO 3, NAD DP/PT pulses palpable, CRT less than 3 seconds Nails hypertrophic, dystrophic, elongated, brittle, discolored 10. There is tenderness overlying the nails 1-5 bilaterally. There is no surrounding erythema or drainage along the nail sites. Minimal callus formation noted.  No other open lesions or preulcerative lesions. Dry skin present bilaterally. No open lesions or pre-ulcerative lesions are identified. No pain with calf compression, swelling, warmth, erythema.  Assessment: Patient presents with symptomatic onychomycosis  Plan: -Treatment options including alternatives, risks, complications were discussed -Nails sharply debrided 10 without complication/bleeding. -Continue gabapentin  for neuropathy. -Moisturizer for dry skin. -Continue daily foot inspection, glucose control.  Return in about 10 weeks (around 05/31/2024) for nail trim.   Bobbie Burows, DPM

## 2024-03-26 DIAGNOSIS — E669 Obesity, unspecified: Secondary | ICD-10-CM | POA: Diagnosis not present

## 2024-03-26 DIAGNOSIS — I1310 Hypertensive heart and chronic kidney disease without heart failure, with stage 1 through stage 4 chronic kidney disease, or unspecified chronic kidney disease: Secondary | ICD-10-CM | POA: Diagnosis not present

## 2024-03-26 DIAGNOSIS — R109 Unspecified abdominal pain: Secondary | ICD-10-CM | POA: Diagnosis not present

## 2024-03-26 DIAGNOSIS — Z23 Encounter for immunization: Secondary | ICD-10-CM | POA: Diagnosis not present

## 2024-05-30 ENCOUNTER — Ambulatory Visit: Admitting: Podiatry

## 2024-05-30 ENCOUNTER — Encounter: Payer: Self-pay | Admitting: Podiatry

## 2024-05-30 VITALS — Ht 65.0 in | Wt 192.4 lb

## 2024-05-30 DIAGNOSIS — B351 Tinea unguium: Secondary | ICD-10-CM

## 2024-05-30 DIAGNOSIS — Q828 Other specified congenital malformations of skin: Secondary | ICD-10-CM

## 2024-05-30 DIAGNOSIS — M79674 Pain in right toe(s): Secondary | ICD-10-CM | POA: Diagnosis not present

## 2024-05-30 DIAGNOSIS — E1149 Type 2 diabetes mellitus with other diabetic neurological complication: Secondary | ICD-10-CM

## 2024-05-30 DIAGNOSIS — M79675 Pain in left toe(s): Secondary | ICD-10-CM

## 2024-06-01 NOTE — Progress Notes (Signed)
 Subjective: Chief Complaint  Patient presents with   Nail Problem    Pt is here for [facility].     78 y.o. returns the office today for painful, elongated, thickened toenails which she cannot trim herself. Denies any redness or drainage around the nails.    PCP: Delores Rojelio Caldron, NP Last seen: 03/26/2024 Last A1c was 7.3 on September 20, 2023  Objective: AAO 3, NAD DP/PT pulses palpable, CRT less than 3 seconds Nails hypertrophic, dystrophic, elongated, brittle, discolored 10. There is tenderness overlying the nails 1-5 bilaterally. There is no surrounding erythema or drainage along the nail sites. Hyperkeratotic lesion noted right foot submetatarsal 5 without any underlying ulceration, drainage or signs of infection. Dry skin present bilaterally. No open lesions or pre-ulcerative lesions are identified. No pain with calf compression, swelling, warmth, erythema.  Assessment: Patient presents with symptomatic onychomycosis  Plan: -Treatment options including alternatives, risks, complications were discussed -Nails sharply debrided 10 without complication/bleeding. -Sharply debrided the hyperkeratotic lesion x 1 without any complications or bleeding. -Continue gabapentin  for neuropathy. -Moisturizer for dry skin. -Continue daily foot inspection, glucose control.  Return in about 10 weeks (around 08/08/2024) for nail trim .  Donnice Fees, DPM

## 2024-06-18 DIAGNOSIS — Z1231 Encounter for screening mammogram for malignant neoplasm of breast: Secondary | ICD-10-CM | POA: Diagnosis not present

## 2024-06-18 LAB — HM MAMMOGRAPHY

## 2024-08-12 ENCOUNTER — Ambulatory Visit: Admitting: Podiatry

## 2024-08-12 DIAGNOSIS — E1149 Type 2 diabetes mellitus with other diabetic neurological complication: Secondary | ICD-10-CM

## 2024-08-12 DIAGNOSIS — Q828 Other specified congenital malformations of skin: Secondary | ICD-10-CM

## 2024-08-12 DIAGNOSIS — M79674 Pain in right toe(s): Secondary | ICD-10-CM

## 2024-08-12 DIAGNOSIS — B351 Tinea unguium: Secondary | ICD-10-CM

## 2024-08-12 DIAGNOSIS — M79675 Pain in left toe(s): Secondary | ICD-10-CM | POA: Diagnosis not present

## 2024-08-14 NOTE — Progress Notes (Signed)
 Subjective: Chief Complaint  Patient presents with   Nail Problem    Pt stated that she is here to have her nails trimmed     78 y.o. returns the office today for painful, elongated, thickened toenails which she cannot trim herself. Denies any redness or drainage around the nails.    PCP: Tammy Rojelio Caldron, NP Last seen: 03/26/2024 Last A1c was 7.3 on September 20, 2023  Objective: AAO 3, NAD DP/PT pulses palpable, CRT less than 3 seconds Nails hypertrophic, dystrophic, elongated, brittle, discolored 10. There is tenderness overlying the nails 1-5 bilaterally. There is no surrounding erythema or drainage along the nail sites. Hyperkeratotic lesion noted right foot submetatarsal 5 without any underlying ulceration, drainage or signs of infection. No open lesions or pre-ulcerative lesions are identified. No pain with calf compression, swelling, warmth, erythema.  Assessment: Patient presents with symptomatic onychomycosis; hyperkeratotic lesion  Plan: -Treatment options including alternatives, risks, complications were discussed -Nails sharply debrided 10 without complication/bleeding. -Sharply debrided the hyperkeratotic lesion x 1 without any complications or bleeding.  Lesion is submetatarsal. -Continue gabapentin  for neuropathy. -Moisturizer for dry skin. -Continue daily foot inspection, glucose control.  Return in about 3 months (around 11/12/2024) for nail trim .  Tammy Preston, DPM

## 2024-09-23 DIAGNOSIS — N1831 Chronic kidney disease, stage 3a: Secondary | ICD-10-CM | POA: Diagnosis not present

## 2024-10-03 DIAGNOSIS — N2581 Secondary hyperparathyroidism of renal origin: Secondary | ICD-10-CM | POA: Diagnosis not present

## 2024-10-03 DIAGNOSIS — N1831 Chronic kidney disease, stage 3a: Secondary | ICD-10-CM | POA: Diagnosis not present

## 2024-10-03 DIAGNOSIS — I129 Hypertensive chronic kidney disease with stage 1 through stage 4 chronic kidney disease, or unspecified chronic kidney disease: Secondary | ICD-10-CM | POA: Diagnosis not present

## 2024-10-03 DIAGNOSIS — E1122 Type 2 diabetes mellitus with diabetic chronic kidney disease: Secondary | ICD-10-CM | POA: Diagnosis not present

## 2024-10-03 DIAGNOSIS — D631 Anemia in chronic kidney disease: Secondary | ICD-10-CM | POA: Diagnosis not present

## 2024-11-14 ENCOUNTER — Ambulatory Visit: Admitting: Podiatry

## 2024-11-14 DIAGNOSIS — Q828 Other specified congenital malformations of skin: Secondary | ICD-10-CM

## 2024-11-14 DIAGNOSIS — E1149 Type 2 diabetes mellitus with other diabetic neurological complication: Secondary | ICD-10-CM

## 2024-11-14 DIAGNOSIS — M79674 Pain in right toe(s): Secondary | ICD-10-CM

## 2024-11-14 DIAGNOSIS — B351 Tinea unguium: Secondary | ICD-10-CM

## 2024-11-14 NOTE — Progress Notes (Unsigned)
 Subjective: Chief Complaint  Patient presents with   [facility]    NIDDM patient with an A1c of 7.3 presents today for [facility] and nail trim    79 y.o. returns the office today for painful, elongated, thickened toenails which she cannot trim herself. Denies any redness or drainage around the nails.    PCP: Delores Rojelio Caldron, NP Last seen: 03/26/2024   Objective: AAO 3, NAD DP/PT pulses palpable, CRT less than 3 seconds Nails hypertrophic, dystrophic, elongated, brittle, discolored 10. There is tenderness overlying the nails 1-5 bilaterally. There is no surrounding erythema or drainage along the nail sites. Hyperkeratotic lesion noted right foot submetatarsal 5 without any underlying ulceration, drainage or signs of infection. No open lesions or pre-ulcerative lesions are identified. No pain with calf compression, swelling, warmth, erythema.  Assessment: Patient presents with symptomatic onychomycosis; hyperkeratotic lesion  Plan: -Treatment options including alternatives, risks, complications were discussed -Nails sharply debrided 10 without complication/bleeding. -Sharply debrided the hyperkeratotic lesion x 1 without any complications or bleeding.  Lesion is submetatarsal and not near the toes. -Continue gabapentin  for neuropathy. -Moisturizer for dry skin. -Continue daily foot inspection, glucose control.  Return in about 10 weeks (around 01/23/2025).  Donnice Fees, DPM

## 2025-01-23 ENCOUNTER — Ambulatory Visit: Admitting: Podiatry
